# Patient Record
Sex: Male | Born: 1935 | Race: White | Hispanic: No | Marital: Married | Smoking: Former smoker
Health system: Southern US, Community
[De-identification: ages and names within clinical notes are randomized; demographics above are authoritative.]

## PROBLEM LIST (undated history)

## (undated) DIAGNOSIS — Z79899 Other long term (current) drug therapy: Secondary | ICD-10-CM

## (undated) DIAGNOSIS — I495 Sick sinus syndrome: Secondary | ICD-10-CM

## (undated) DIAGNOSIS — E039 Hypothyroidism, unspecified: Secondary | ICD-10-CM

## (undated) DIAGNOSIS — R0789 Other chest pain: Secondary | ICD-10-CM

## (undated) DIAGNOSIS — K219 Gastro-esophageal reflux disease without esophagitis: Secondary | ICD-10-CM

## (undated) DIAGNOSIS — F32A Depression, unspecified: Secondary | ICD-10-CM

## (undated) DIAGNOSIS — I480 Paroxysmal atrial fibrillation: Secondary | ICD-10-CM

## (undated) DIAGNOSIS — G459 Transient cerebral ischemic attack, unspecified: Secondary | ICD-10-CM

## (undated) DIAGNOSIS — F329 Major depressive disorder, single episode, unspecified: Secondary | ICD-10-CM

## (undated) HISTORY — DX: Sick sinus syndrome: I49.5

## (undated) HISTORY — DX: Other long term (current) drug therapy: Z79.899

## (undated) HISTORY — DX: Other chest pain: R07.89

## (undated) HISTORY — DX: Transient cerebral ischemic attack, unspecified: G45.9

## (undated) HISTORY — DX: Paroxysmal atrial fibrillation: I48.0

## (undated) HISTORY — DX: Depression, unspecified: F32.A

## (undated) HISTORY — DX: Gastro-esophageal reflux disease without esophagitis: K21.9

## (undated) HISTORY — DX: Major depressive disorder, single episode, unspecified: F32.9

## (undated) HISTORY — DX: Hypothyroidism, unspecified: E03.9

## (undated) HISTORY — PX: ABDOMINAL SURGERY: SHX537

---

## 1997-10-08 ENCOUNTER — Ambulatory Visit (HOSPITAL_COMMUNITY): Admission: RE | Admit: 1997-10-08 | Discharge: 1997-10-08 | Payer: Self-pay | Admitting: Gastroenterology

## 1998-12-01 ENCOUNTER — Ambulatory Visit (HOSPITAL_COMMUNITY): Admission: RE | Admit: 1998-12-01 | Discharge: 1998-12-01 | Payer: Self-pay | Admitting: Gastroenterology

## 1999-08-08 ENCOUNTER — Encounter: Payer: Self-pay | Admitting: General Surgery

## 1999-08-08 ENCOUNTER — Inpatient Hospital Stay (HOSPITAL_COMMUNITY): Admission: EM | Admit: 1999-08-08 | Discharge: 1999-08-13 | Payer: Self-pay | Admitting: *Deleted

## 1999-08-11 ENCOUNTER — Encounter: Payer: Self-pay | Admitting: General Surgery

## 1999-09-17 ENCOUNTER — Encounter: Admission: RE | Admit: 1999-09-17 | Discharge: 1999-09-17 | Payer: Self-pay | Admitting: General Surgery

## 1999-09-17 ENCOUNTER — Encounter: Payer: Self-pay | Admitting: General Surgery

## 2000-01-04 ENCOUNTER — Encounter: Payer: Self-pay | Admitting: Gastroenterology

## 2000-01-04 ENCOUNTER — Ambulatory Visit (HOSPITAL_COMMUNITY): Admission: RE | Admit: 2000-01-04 | Discharge: 2000-01-04 | Payer: Self-pay | Admitting: Gastroenterology

## 2000-02-10 ENCOUNTER — Encounter: Payer: Self-pay | Admitting: General Surgery

## 2000-02-16 ENCOUNTER — Encounter (INDEPENDENT_AMBULATORY_CARE_PROVIDER_SITE_OTHER): Payer: Self-pay | Admitting: Specialist

## 2000-02-16 ENCOUNTER — Inpatient Hospital Stay (HOSPITAL_COMMUNITY): Admission: RE | Admit: 2000-02-16 | Discharge: 2000-02-23 | Payer: Self-pay | Admitting: General Surgery

## 2004-01-14 ENCOUNTER — Ambulatory Visit: Payer: Self-pay | Admitting: Gastroenterology

## 2004-01-16 ENCOUNTER — Ambulatory Visit: Payer: Self-pay | Admitting: Cardiology

## 2004-01-17 ENCOUNTER — Ambulatory Visit: Payer: Self-pay | Admitting: Cardiology

## 2004-01-20 ENCOUNTER — Ambulatory Visit (HOSPITAL_COMMUNITY): Admission: RE | Admit: 2004-01-20 | Discharge: 2004-01-20 | Payer: Self-pay | Admitting: Gastroenterology

## 2004-01-20 ENCOUNTER — Ambulatory Visit: Payer: Self-pay | Admitting: Gastroenterology

## 2004-01-21 ENCOUNTER — Ambulatory Visit: Payer: Self-pay | Admitting: Cardiology

## 2004-02-05 ENCOUNTER — Ambulatory Visit: Payer: Self-pay | Admitting: Gastroenterology

## 2004-12-03 ENCOUNTER — Ambulatory Visit: Payer: Self-pay | Admitting: Gastroenterology

## 2006-04-05 ENCOUNTER — Ambulatory Visit: Payer: Self-pay | Admitting: Gastroenterology

## 2007-05-30 ENCOUNTER — Encounter: Payer: Self-pay | Admitting: Cardiology

## 2007-06-03 ENCOUNTER — Ambulatory Visit: Payer: Self-pay | Admitting: Cardiology

## 2007-06-05 ENCOUNTER — Encounter: Payer: Self-pay | Admitting: Cardiology

## 2007-06-20 ENCOUNTER — Encounter: Payer: Self-pay | Admitting: Cardiology

## 2007-06-28 ENCOUNTER — Ambulatory Visit: Payer: Self-pay | Admitting: Gastroenterology

## 2007-07-04 ENCOUNTER — Encounter: Payer: Self-pay | Admitting: Cardiology

## 2007-07-05 ENCOUNTER — Ambulatory Visit (HOSPITAL_COMMUNITY): Admission: RE | Admit: 2007-07-05 | Discharge: 2007-07-05 | Payer: Self-pay | Admitting: Gastroenterology

## 2007-07-10 ENCOUNTER — Ambulatory Visit: Payer: Self-pay | Admitting: Cardiology

## 2007-07-12 ENCOUNTER — Ambulatory Visit: Payer: Self-pay | Admitting: Gastroenterology

## 2007-07-20 ENCOUNTER — Ambulatory Visit: Payer: Self-pay | Admitting: Cardiology

## 2007-08-04 ENCOUNTER — Ambulatory Visit: Payer: Self-pay | Admitting: Cardiology

## 2007-08-24 ENCOUNTER — Ambulatory Visit: Payer: Self-pay | Admitting: Cardiology

## 2007-09-07 ENCOUNTER — Ambulatory Visit: Payer: Self-pay | Admitting: Cardiology

## 2007-09-20 ENCOUNTER — Encounter: Payer: Self-pay | Admitting: Gastroenterology

## 2007-09-28 ENCOUNTER — Ambulatory Visit: Payer: Self-pay | Admitting: Cardiology

## 2007-10-27 ENCOUNTER — Ambulatory Visit: Payer: Self-pay | Admitting: Cardiology

## 2007-11-24 ENCOUNTER — Ambulatory Visit: Payer: Self-pay | Admitting: Cardiology

## 2007-12-22 ENCOUNTER — Ambulatory Visit: Payer: Self-pay | Admitting: Cardiology

## 2008-01-05 ENCOUNTER — Ambulatory Visit: Payer: Self-pay | Admitting: Cardiology

## 2008-01-26 ENCOUNTER — Ambulatory Visit: Payer: Self-pay | Admitting: Cardiology

## 2008-02-23 ENCOUNTER — Ambulatory Visit: Payer: Self-pay | Admitting: Cardiology

## 2008-02-28 ENCOUNTER — Encounter: Payer: Self-pay | Admitting: Cardiology

## 2008-03-12 ENCOUNTER — Ambulatory Visit: Payer: Self-pay | Admitting: Cardiology

## 2008-03-12 ENCOUNTER — Encounter: Payer: Self-pay | Admitting: Cardiology

## 2008-03-18 ENCOUNTER — Ambulatory Visit: Payer: Self-pay | Admitting: Cardiology

## 2008-04-12 ENCOUNTER — Ambulatory Visit: Payer: Self-pay | Admitting: Cardiology

## 2008-05-10 ENCOUNTER — Ambulatory Visit: Payer: Self-pay | Admitting: Cardiology

## 2008-05-21 ENCOUNTER — Ambulatory Visit: Payer: Self-pay | Admitting: Cardiology

## 2008-05-23 ENCOUNTER — Ambulatory Visit: Payer: Self-pay | Admitting: Cardiology

## 2008-06-18 ENCOUNTER — Ambulatory Visit: Payer: Self-pay | Admitting: Cardiology

## 2008-07-02 ENCOUNTER — Ambulatory Visit: Payer: Self-pay | Admitting: Cardiology

## 2008-07-30 ENCOUNTER — Ambulatory Visit: Payer: Self-pay | Admitting: Cardiology

## 2008-08-27 ENCOUNTER — Ambulatory Visit: Payer: Self-pay | Admitting: Cardiology

## 2008-09-10 ENCOUNTER — Ambulatory Visit: Payer: Self-pay | Admitting: Cardiology

## 2008-10-04 ENCOUNTER — Ambulatory Visit: Payer: Self-pay

## 2008-10-28 ENCOUNTER — Encounter: Payer: Self-pay | Admitting: *Deleted

## 2008-11-01 ENCOUNTER — Ambulatory Visit: Payer: Self-pay | Admitting: Cardiology

## 2008-11-01 LAB — CONVERTED CEMR LAB
POC INR: 2.8
Prothrombin Time: 20.2 s

## 2008-11-11 DIAGNOSIS — R079 Chest pain, unspecified: Secondary | ICD-10-CM | POA: Insufficient documentation

## 2008-11-11 DIAGNOSIS — I4891 Unspecified atrial fibrillation: Secondary | ICD-10-CM

## 2008-11-15 ENCOUNTER — Ambulatory Visit: Payer: Self-pay | Admitting: Cardiology

## 2008-11-15 ENCOUNTER — Encounter (INDEPENDENT_AMBULATORY_CARE_PROVIDER_SITE_OTHER): Payer: Self-pay | Admitting: *Deleted

## 2008-11-15 DIAGNOSIS — E041 Nontoxic single thyroid nodule: Secondary | ICD-10-CM

## 2008-11-15 DIAGNOSIS — I498 Other specified cardiac arrhythmias: Secondary | ICD-10-CM

## 2008-11-15 DIAGNOSIS — R5383 Other fatigue: Secondary | ICD-10-CM

## 2008-11-15 DIAGNOSIS — R5381 Other malaise: Secondary | ICD-10-CM | POA: Insufficient documentation

## 2008-11-19 ENCOUNTER — Encounter: Payer: Self-pay | Admitting: Cardiology

## 2008-11-22 ENCOUNTER — Ambulatory Visit: Payer: Self-pay | Admitting: Cardiology

## 2008-11-25 ENCOUNTER — Encounter (INDEPENDENT_AMBULATORY_CARE_PROVIDER_SITE_OTHER): Payer: Self-pay | Admitting: *Deleted

## 2008-11-27 ENCOUNTER — Encounter (INDEPENDENT_AMBULATORY_CARE_PROVIDER_SITE_OTHER): Payer: Self-pay | Admitting: *Deleted

## 2008-11-29 ENCOUNTER — Ambulatory Visit: Payer: Self-pay | Admitting: Cardiology

## 2008-11-29 LAB — CONVERTED CEMR LAB: POC INR: 2.5

## 2008-12-18 ENCOUNTER — Telehealth (INDEPENDENT_AMBULATORY_CARE_PROVIDER_SITE_OTHER): Payer: Self-pay | Admitting: *Deleted

## 2008-12-27 ENCOUNTER — Ambulatory Visit: Payer: Self-pay | Admitting: Cardiology

## 2008-12-27 LAB — CONVERTED CEMR LAB: POC INR: 3

## 2009-01-10 ENCOUNTER — Ambulatory Visit: Payer: Self-pay | Admitting: Cardiology

## 2009-01-10 LAB — CONVERTED CEMR LAB: POC INR: 1.8

## 2009-01-24 ENCOUNTER — Encounter: Payer: Self-pay | Admitting: Cardiology

## 2009-01-28 ENCOUNTER — Ambulatory Visit: Payer: Self-pay | Admitting: Cardiology

## 2009-02-18 ENCOUNTER — Ambulatory Visit: Payer: Self-pay | Admitting: Cardiology

## 2009-02-18 LAB — CONVERTED CEMR LAB: POC INR: 3.3

## 2009-03-18 ENCOUNTER — Ambulatory Visit: Payer: Self-pay | Admitting: Cardiology

## 2009-03-18 LAB — CONVERTED CEMR LAB: POC INR: 2.9

## 2009-03-24 ENCOUNTER — Encounter: Payer: Self-pay | Admitting: Cardiology

## 2009-04-08 ENCOUNTER — Ambulatory Visit: Payer: Self-pay | Admitting: Cardiology

## 2009-04-10 ENCOUNTER — Ambulatory Visit: Payer: Self-pay | Admitting: Cardiology

## 2009-04-22 ENCOUNTER — Ambulatory Visit: Payer: Self-pay | Admitting: Cardiology

## 2009-05-06 ENCOUNTER — Encounter: Payer: Self-pay | Admitting: Cardiology

## 2009-05-07 ENCOUNTER — Encounter: Payer: Self-pay | Admitting: Cardiology

## 2009-05-20 ENCOUNTER — Encounter: Payer: Self-pay | Admitting: Cardiology

## 2009-07-13 DIAGNOSIS — I480 Paroxysmal atrial fibrillation: Secondary | ICD-10-CM

## 2009-07-13 HISTORY — DX: Paroxysmal atrial fibrillation: I48.0

## 2009-08-05 ENCOUNTER — Ambulatory Visit: Payer: Self-pay | Admitting: Cardiology

## 2009-08-08 ENCOUNTER — Encounter: Payer: Self-pay | Admitting: Cardiology

## 2009-08-14 ENCOUNTER — Encounter (INDEPENDENT_AMBULATORY_CARE_PROVIDER_SITE_OTHER): Payer: Self-pay | Admitting: *Deleted

## 2010-01-30 ENCOUNTER — Ambulatory Visit: Payer: Self-pay | Admitting: Cardiology

## 2010-04-12 LAB — CONVERTED CEMR LAB: POC INR: 1.7

## 2010-04-14 NOTE — Letter (Signed)
Summary: [facility] D/C DR. Leandrew Koyanagi  [facility] D[facility]C DR. Leandrew Koyanagi   Imported By: Zachary Nafis 08/04/2009 16:50:22  _____________________________________________________________________  External Attachment:    Type:   Image     Comment:   External Document

## 2010-04-14 NOTE — Assessment & Plan Note (Signed)
Summary: 3 MO FU REMINDER-SRS   Visit Type:  Follow-up Primary Provider:  Margot Ables  CC:  Atrial Fibrillation.  History of Present Illness: The patient has a history of atrial fibrillation. He has had some issues of bradycardia in the past and recently had his beta blocker discontinued. He had a stress perfusion study in September demonstrating that he did have a reasonable heart rate response with exercise. There was no evidence of ischemia or infarct. He returns for routine followup. He has had no new symptoms. He goes to [facility] rehabilitation and brings wood in for his house. With this level of activity he is not having any new shortness of breath and denies any chest pressure, neck or arm discomfort. He doesn't notice palpitations. He's had no syncope though occasionally he will get some lightheadedness but this has been a chronic pattern for years.  Preventive Screening-Counseling & Management  Comments: Chewed tobacco for about 20 yrs and quit about 2 yrs ago. Pt smoked 3 yrs and quit about 50 yrs ago.  Current Medications (verified): 1)  Ocuvite Preservision  Tabs (Multiple Vitamins-Minerals) .... Take 1 Tablet By Mouth Two Times A Day 2)  Vitamin B-12 1000 Mcg Tabs (Cyanocobalamin) .... Take 1 Tablet By Mouth Once A Day 3)  Simvastatin 20 Mg Tabs (Simvastatin) .... Take One Tablet By Mouth Daily At Bedtime 4)  Warfarin Sodium 5 Mg Tabs (Warfarin Sodium) .... Use As Directed By Anticoagulation Clinic 5)  Omeprazole 20 Mg Cpdr (Omeprazole) .... Take 1 Tablet By Mouth Once A Day 6)  Lexapro 10 Mg Tabs (Escitalopram Oxalate) .... Take 1 Tablet By Mouth Once A Day 7)  Levothyroxine Sodium 25 Mcg Tabs (Levothyroxine Sodium) .... Take 1 Tablet By Mouth Once A Day 8)  Aciphex 20 Mg Tbec (Rabeprazole Sodium) .... Take 1 Tablet By Mouth Once A Day 9)  Lorazepam 0.5 Mg Tabs (Lorazepam) .... As Needed  Allergies (verified): No Known Drug Allergies  Comments:  Nurse/Medical  Assistant: The patient's medications were reviewed with the patient and were updated in the Medication List. Pt brought a list of medications to office visit.  Cyril Loosen, RN, BSN (April 10, 2009 9:13 AM)  Past History:  Past Medical History: Reviewed history from 11/15/2008 and no changes required. ATRIAL FIBRILLATION (ICD-427.31) CHEST PAIN-UNSPECIFIED (ICD-786.50) Gastroesophageal reflux disease.  Hypothyroidism.  Depression 1. Paroxysmal atrial fibrillation in normal sinus rhythm.     a.     Rule out symptomatic bradycardia.     b.     Rule out post conversion pauses. 2. History of transient ischemic attack, high risk for thromboembolic     disease, secondary to atrial fibrillation. 3. Coumadin therapy. 4. Atypical chest pain. 5. Gastroesophageal reflux disease. 6. Hypothyroidism. 7. Depression.   Past Surgical History: Abdomonal surgery for diverticulitis  Review of Systems       As stated in the HPI and negative for all other systems.   Vital Signs:  Patient profile:   77 year old male Height:      68 inches Weight:      183 pounds Pulse rate:   62 / minute BP sitting:   121 / 69  (left arm) Cuff size:   regular  Vitals Entered By: Cyril Loosen, RN, BSN (April 10, 2009 9:06 AM) CC: Atrial Fibrillation Comments follow up visit. Pt states he's doing well.   Physical Exam  General:  Well developed, well nourished, in no acute distress. Head:  normocephalic and atraumatic Eyes:  PERRLA/EOM  intact; conjunctiva and lids normal. Mouth:  Teeth, gums and palate normal. Oral mucosa normal. Neck:  Neck supple, no JVD. No masses, thyromegaly or abnormal cervical nodes. Chest Wall:  no deformities or breast masses noted Lungs:  Clear bilaterally to auscultation and percussion. Heart:  Non-displaced PMI, chest non-tender; regular rate and rhythm, S1, S2 without murmurs, rubs or gallops. Carotid upstroke normal, no bruit. Normal abdominal aortic size, no  bruits. Femorals normal pulses, no bruits. Pedals normal pulses. No edema, no varicosities. Abdomen:  Bowel sounds positive; abdomen soft and non-tender without masses, organomegaly, or hernias noted. No hepatosplenomegaly. Msk:  Back normal, normal gait. Muscle strength and tone normal for age Extremities:  No clubbing or cyanosis. Neurologic:  Alert and oriented x 3. Skin:  Intact without lesions or rashes. Psych:  Normal affect.   EKG  Procedure date:  04/10/2009  Findings:      sinus bradycardia, rate 57, axis within normal limits, intervals within normal limits, no acute ST-T wave changes.  Impression & Recommendations:  Problem # 1:  ATRIAL FIBRILLATION (ICD-427.31) He has had paroxysmal atrial fibrillation. He doesn't recall any symptomatic recurrences of this since his last visit. He tolerates Coumadin. He is off of all AV nodal blocking agents. We discussed the importance of notifying Dr. began should he ever had presyncope or syncope or other symptoms that could be related to bradycardia. For now he needs no change in therapy.  Problem # 2:  CHEST PAIN-UNSPECIFIED (ICD-786.50) He had a negative stress perfusion study in the fall. No further testing is suggested.  Other Orders: EKG w/ Interpretation (93000)  Patient Instructions: 1)  Your physician recommends that you continue on your current medications as directed. Please refer to the Current Medication list given to you today. 2)  Your physician wants you to follow-up in:30months.  You will receive a reminder letter in the mail about two months in advance. If you don't receive a letter, please call our office to schedule the follow-up appointment.

## 2010-04-14 NOTE — Procedures (Signed)
Summary: Holter and Event/ [facility]  Holter and Event/ [facility]   Imported By: Dorise Hiss 08/13/2009 09:59:38  _____________________________________________________________________  External Attachment:    Type:   Image     Comment:   External Document  Appended Document: Holter and Event/ [facility] Monitor essentially unremarkable.   Appended Document: Holter and Event/ [facility] Patient notified by letter.

## 2010-04-14 NOTE — Medication Information (Signed)
Summary: ccr-lr  Anticoagulant Therapy  Managed by: Vashti Hey, RN PCP: Margot Ables Supervising MD: Andee Lineman MD, Michelle Piper Indication 1: Atrial Fibrillation (ICD-427.31) [facility] [place] Site: [facility] INR POC 2.9  Dietary changes: no    Health status changes: no    Bleeding/hemorrhagic complications: no    Recent/future hospitalizations: no    Any changes in medication regimen? no    Recent/future dental: yes     Details: had tooth extraction last Monday    Any missed doses?: yes     Details: off coumadin 5 days for extraction    Allergies: No Known Drug Allergies  Anticoagulation Management History:      Positive risk factors for bleeding include an age of 75 years or older.  The bleeding index is 'intermediate risk'.  Positive CHADS2 values include Age > 48 years old.  The start date was 07/10/2007.  Anticoagulation responsible provider: Andee Lineman MD, Michelle Piper.  INR POC: 2.9.  Exp: 10/11.    Anticoagulation Management Assessment/Plan:      The patient's current anticoagulation dose is Warfarin sodium 5 mg tabs: Use as directed by Anticoagulation Clinic.  The target INR is 2 - 3.  The next INR is due 04/08/2009.  Anticoagulation instructions were given to patient.  Results were reviewed/authorized by Vashti Hey, RN.  He was notified by Vashti Hey RN.         Prior Anticoagulation Instructions: INR 3.3 Hold coumadin tonight then resume 5mg  once daily   Current Anticoagulation Instructions: INR 2.9 Continue coumadin 5mg  once daily

## 2010-04-14 NOTE — Letter (Signed)
Summary: Appointment -missed  [facility] [address]   [place], [place] [address]   Phone: [phone]  Fax: [phone]     May 20, 2009 MRN: [id number]      Wesley Dyer [address] [place], [place]  [address]     Dear Mr. TOOKER,  Our records indicate you missed your appointment on May 20, 2009                        with Coumadin.   It is very important that we reach you to reschedule this appointment. We look forward to participating in your health care needs.   Please contact us at the number listed above at your earliest convenience to reschedule this appointment.   Sincerely,    Glass blower/designer

## 2010-04-14 NOTE — Medication Information (Signed)
Summary: ccr-lr  Anticoagulant Therapy  Managed by: Vashti Hey, RN PCP: Margot Ables Supervising MD: Andee Lineman MD, Michelle Piper Indication 1: Atrial Fibrillation (ICD-427.31) [facility]  Site: [facility] INR POC 3.7  Dietary changes: no    Health status changes: no    Bleeding/hemorrhagic complications: no    Recent/future hospitalizations: no    Any changes in medication regimen? no    Recent/future dental: yes     Details: another tooth pulled  Any missed doses?: yes     Details: was off coumadin 5 days for extraction and Abx for congestion  Is patient compliant with meds? yes       Allergies: No Known Drug Allergies  Anticoagulation Management History:      The patient is taking warfarin and comes in today for a routine follow up visit.  Positive risk factors for bleeding include an age of 75 years or older.  The bleeding index is 'intermediate risk'.  Positive CHADS2 values include Age > 75 years old.  The start date was 07/10/2007.  Anticoagulation responsible provider: Andee Lineman MD, Michelle Piper.  INR POC: 3.7.  Cuvette Lot#: [id number].  Exp: 10/11.    Anticoagulation Management Assessment/Plan:      The patient's current anticoagulation dose is Warfarin sodium 5 mg tabs: Use as directed by Anticoagulation Clinic.  The target INR is 2 - 3.  The next INR is due 04/22/2009.  Anticoagulation instructions were given to patient.  Results were reviewed/authorized by Vashti Hey, RN.  He was notified by Vashti Hey RN.         Prior Anticoagulation Instructions: INR 2.9 Continue coumadin 5mg  once daily   Current Anticoagulation Instructions: INR 3.7 Hold coumadin tonight then decrease dose to 5mg  once daily except 2.5mg  on Mondays and Fridays

## 2010-04-14 NOTE — Assessment & Plan Note (Signed)
Summary: 6 MO FU PER NOV REMINDER   Visit Type:  Follow-up Primary Neshia Mckenzie:  Margot Ables   History of Present Illness: the patient is an 77 year old male with a history of paroxysmal atrial fibrillation more recently in sinus bradycardia. He has previously been ruled out for post conversion pauses. Has a prior history of TIA and remains at high risk for thromboembolic disease secondary to atrial fibrillation. Coumadin was previously discontinued secondary to severe epistaxis during the last visit the patient was not willing to restart Coumadin.the patient have a Holter monitor done in May of 2011 this showed essentially normal sinus rhythm with a minimum heart rate of 40 beats per minute no significant pauses greater than 2.5 seconds and brief runs of atrial arrhythmia No palpitions. No dizziness. Feels tired. Some depression. No syncope. Declines coumadin. No TIAs  Preventive Screening-Counseling & Management  Alcohol-Tobacco     Smoking Status: quit     Year Quit: 1985  Current Medications (verified): 1)  Ocuvite Preservision  Tabs (Multiple Vitamins-Minerals) .... Take 1 Tablet By Mouth Two Times A Day 2)  Vitamin B-12 1000 Mcg Tabs (Cyanocobalamin) .... Take 1 Tablet By Mouth Once A Day 3)  Simvastatin 20 Mg Tabs (Simvastatin) .... Take One Tablet By Mouth Daily At Bedtime 4)  Omeprazole 20 Mg Cpdr (Omeprazole) .... Take 1 Tablet By Mouth Once A Day 5)  Lexapro 20 Mg Tabs (Escitalopram Oxalate) .... Take 1 Tablet By Mouth Once A Day 6)  Levothyroxine Sodium 25 Mcg Tabs (Levothyroxine Sodium) .... Take 1 Tablet By Mouth Once A Day 7)  Caltrate 600+d 600-400 Mg-Unit Tabs (Calcium Carbonate-Vitamin D) .... Take 1 Tablet By Mouth Two Times A Day  Allergies (verified): 1)  ! Coumadin  Comments:  Nurse/Medical Assistant: The patient is currently on medications but does not know the name or dosage at this time. Instructed to contact our office with details. Will update medication list  at that time.  Past History:  Past Medical History: Last updated: 11/15/2008 ATRIAL FIBRILLATION (ICD-427.31) CHEST PAIN-UNSPECIFIED (ICD-786.50) Gastroesophageal reflux disease.  Hypothyroidism.  Depression 1. Paroxysmal atrial fibrillation in normal sinus rhythm.     a.     Rule out symptomatic bradycardia.     b.     Rule out post conversion pauses. 2. History of transient ischemic attack, high risk for thromboembolic     disease, secondary to atrial fibrillation. 3. Coumadin therapy. 4. Atypical chest pain. 5. Gastroesophageal reflux disease. 6. Hypothyroidism. 7. Depression.   Past Surgical History: Last updated: 04/10/2009 Abdomonal surgery for diverticulitis  Family History: Last updated: 11/11/2008 Family History of Coronary Artery Disease:  Family History of Hyperlipidemia:   Social History: Last updated: 11/11/2008 Retired  Married  Tobacco Use - No.  Alcohol Use - no  Risk Factors: Smoking Status: quit (01/30/2010)  Review of Systems       The patient complains of fatigue.  The patient denies malaise, fever, weight gain/loss, vision loss, decreased hearing, hoarseness, chest pain, palpitations, shortness of breath, prolonged cough, wheezing, sleep apnea, coughing up blood, abdominal pain, blood in stool, nausea, vomiting, diarrhea, heartburn, incontinence, blood in urine, muscle weakness, joint pain, leg swelling, rash, skin lesions, headache, fainting, dizziness, depression, anxiety, enlarged lymph nodes, easy bruising or bleeding, and environmental allergies.         history of epistaxis  Vital Signs:  Patient profile:   77 year old male Height:      68 inches Weight:      176 pounds  BMI:     26.86 Pulse rate:   46 / minute BP sitting:   147 / 74  (left arm) Cuff size:   regular  Vitals Entered By: Carlye Grippe (January 30, 2010 1:43 PM)  Physical Exam  Additional Exam:  General: Well-developed, well-nourished in no distress head:  Normocephalic and atraumatic eyes PERRLA/EOMI intact, conjunctiva and lids normal nose: No deformity or lesions mouth normal dentition, normal posterior pharynx neck: Supple, no JVD.  No masses, thyromegaly or abnormal cervical nodes lungs: Normal breath sounds bilaterally without wheezing.  Normal percussion heart: regular rate and rhythm with normal S1 and S2, no S3 or S4.  PMI is normal.  No pathological murmurs abdomen: Normal bowel sounds, abdomen is soft and nontender without masses, organomegaly or hernias noted.  No hepatosplenomegaly musculoskeletal: Back normal, normal gait muscle strength and tone normal pulsus: Pulse is normal in all 4 extremities Extremities: No peripheral pitting edema neurologic: Alert and oriented x 3 skin: Intact without lesions or rashes cervical nodes: No significant adenopathy psychologic: Normal affect    Impression & Recommendations:  Problem # 1:  BRADYCARDIA, CHRONIC (ICD-427.89) patient has history of sinus bradycardia.  He also has a history of paroxysmal atrial fibrillation.  He has been present ruled out for postconversion closets.  Has prior history of TIA and remains at high risk for thromboembolic disease however the patient declines to restart Coumadin due to a prior history of epistaxis.  Problem # 2:  ATRIAL FIBRILLATION (ICD-427.31) no recurrent palpitations.  The patient appears to maintaining normal sinus rhythm  Problem # 3:  WEAKNESS (ICD-780.79) generalized fatigue.  No definite cardiac cause.  Patient Instructions: 1)  Your physician recommends that you continue on your current medications as directed. Please refer to the Current Medication list given to you today. 2)  Follow up in  6 months

## 2010-04-14 NOTE — Letter (Signed)
Summary: Appointment- Rescheduled  [facility] [address]   [place], [place] [address]   Phone: [phone]  Fax: [phone]     March 24, 2009 MRN: [id number]      Wesley Dyer [address] [place], [place]  [address]      Dear Wesley Dyer,   Due to a change in our office schedule, your appointment on JAN 27th with  Dr. Andee Lineman has been changed to Jan 28th with Dr. Antoine Poche @ 9:00.   Please call the office only if you wish not to keep appointment.   We look forward to participating in your health care needs.       Sincerely,  Glass blower/designer

## 2010-04-14 NOTE — Medication Information (Signed)
Summary: ccr-lr  Anticoagulant Therapy  Managed by: Vashti Hey, RN PCP: Margot Ables Supervising MD: Andee Lineman MD, Michelle Piper Indication 1: Atrial Fibrillation (ICD-427.31) Lab [facility]: [facility] [place] Site: [facility] INR POC 2.7  Dietary changes: no    Health status changes: no    Bleeding/hemorrhagic complications: no    Recent/future hospitalizations: no    Any changes in medication regimen? no    Recent/future dental: no  Any missed doses?: no       Is patient compliant with meds? yes       Allergies: No Known Drug Allergies  Anticoagulation Management History:      The patient is taking warfarin and comes in today for a routine follow up visit.  Positive risk factors for bleeding include an age of 75 years or older.  The bleeding index is 'intermediate risk'.  Positive CHADS2 values include Age > 26 years old.  The start date was 07/10/2007.  Anticoagulation responsible provider: Andee Lineman MD, Michelle Piper.  INR POC: 2.7.  Cuvette Lot#: [id number].  Exp: 10/11.    Anticoagulation Management Assessment/Plan:      The patient's current anticoagulation dose is Warfarin sodium 5 mg tabs: Use as directed by Anticoagulation Clinic.  The target INR is 2 - 3.  The next INR is due 05/20/2009.  Anticoagulation instructions were given to patient.  Results were reviewed/authorized by Vashti Hey, RN.  He was notified by Vashti Hey RN.         Prior Anticoagulation Instructions: INR 3.7 Hold coumadin tonight then decrease dose to 5mg  once daily except 2.5mg  on Mondays and Fridays  Current Anticoagulation Instructions: INR 2.7 Continue coumadin 5mg  once daily except 2.5mg  on Mondays and Fridays

## 2010-04-14 NOTE — Assessment & Plan Note (Signed)
Summary: 4 MO FU REMINDER-SRS   Visit Type:  Follow-up Primary Provider:  Margot Ables   History of Present Illness: the patient is a77 year old male with a history of proximal atrial fibrillation currently in sinus bradycardia. He has been ruled out for postconversion pauses. He also has a history of TIA and is at high risk for thromboembolic disease is secondary to a perforation. However Coumadin was recently discontinued due to severe epistaxis. At this point the patient is not willing to restart Coumadin yet. Is also not willing to start aspirin. He does report dizziness and fatigue and generalized weakness. His EKG today shows marked sinus bradycardia with a heart rate of 44 beats per minutes.  Preventive Screening-Counseling & Management  Alcohol-Tobacco     Smoking Status: quit     Year Quit: 1985  Current Medications (verified): 1)  Ocuvite Preservision  Tabs (Multiple Vitamins-Minerals) .... Take 1 Tablet By Mouth Two Times A Day 2)  Vitamin B-12 1000 Mcg Tabs (Cyanocobalamin) .... Take 1 Tablet By Mouth Once A Day 3)  Simvastatin 20 Mg Tabs (Simvastatin) .... Take One Tablet By Mouth Daily At Bedtime 4)  Omeprazole 20 Mg Cpdr (Omeprazole) .... Take 1 Tablet By Mouth Once A Day 5)  Lexapro 20 Mg Tabs (Escitalopram Oxalate) .... Take 1 Tablet By Mouth Once A Day 6)  Levothyroxine Sodium 25 Mcg Tabs (Levothyroxine Sodium) .... Take 1 Tablet By Mouth Once A Day 7)  Caltrate 600+d 600-400 Mg-Unit Tabs (Calcium Carbonate-Vitamin D) .... Take 1 Tablet By Mouth Two Times A Day  Allergies (verified): 1)  ! Coumadin  Comments:  Nurse/Medical Assistant: The patient's medications and allergies were reviewed with the patient and were updated in the Medication and Allergy Lists. List reviewed.  Past History:  Past Medical History: Last updated: 11/15/2008 ATRIAL FIBRILLATION (ICD-427.31) CHEST PAIN-UNSPECIFIED (ICD-786.50) Gastroesophageal reflux disease.  Hypothyroidism.    Depression 1. Paroxysmal atrial fibrillation in normal sinus rhythm.     a.     Rule out symptomatic bradycardia.     b.     Rule out post conversion pauses. 2. History of transient ischemic attack, high risk for thromboembolic     disease, secondary to atrial fibrillation. 3. Coumadin therapy. 4. Atypical chest pain. 5. Gastroesophageal reflux disease. 6. Hypothyroidism. 7. Depression.   Past Surgical History: Last updated: 04/10/2009 Abdomonal surgery for diverticulitis  Family History: Last updated: 11/11/2008 Family History of Coronary Artery Disease:  Family History of Hyperlipidemia:   Social History: Last updated: 11/11/2008 Retired  Married  Tobacco Use - No.  Alcohol Use - no  Risk Factors: Smoking Status: quit (08/05/2009)  Social History: Smoking Status:  quit  Review of Systems       The patient complains of fatigue.  The patient denies malaise, fever, weight gain/loss, vision loss, decreased hearing, hoarseness, chest pain, palpitations, shortness of breath, prolonged cough, wheezing, sleep apnea, coughing up blood, abdominal pain, blood in stool, nausea, vomiting, diarrhea, heartburn, incontinence, blood in urine, muscle weakness, joint pain, leg swelling, rash, skin lesions, headache, fainting, dizziness, depression, anxiety, enlarged lymph nodes, easy bruising or bleeding, and environmental allergies.    Vital Signs:  Patient profile:   77 year old male Height:      68 inches Weight:      180 pounds Pulse rate:   44 / minute BP sitting:   133 / 55  (left arm) Cuff size:   large  Vitals Entered By: Carlye Grippe (Aug 05, 2009 8:43 AM)  Physical  Exam  Additional Exam:  General: Well-developed, well-nourished in no distress head: Normocephalic and atraumatic eyes PERRLA/EOMI intact, conjunctiva and lids normal nose: No deformity or lesions mouth normal dentition, normal posterior pharynx neck: Supple, no JVD.  No masses, thyromegaly or abnormal  cervical nodes lungs: Normal breath sounds bilaterally without wheezing.  Normal percussion heart: regular rate and rhythm with normal S1 and S2, no S3 or S4.  PMI is normal.  No pathological murmurs abdomen: Normal bowel sounds, abdomen is soft and nontender without masses, organomegaly or hernias noted.  No hepatosplenomegaly musculoskeletal: Back normal, normal gait muscle strength and tone normal pulsus: Pulse is normal in all 4 extremities Extremities: No peripheral pitting edema neurologic: Alert and oriented x 3 skin: Intact without lesions or rashes cervical nodes: No significant adenopathy psychologic: Normal affect    EKG  Procedure date:  08/05/2009  Findings:      sinus bradycardia heart rate 44 beats per minute nonspecific T-wave changes.  Impression & Recommendations:  Problem # 1:  BRADYCARDIA, CHRONIC (ICD-427.89) we'll apply a Holter monitor to make sure the patient's symptoms of dizziness and weakness are not related to significant bradycardia The following medications were removed from the medication list:    Warfarin Sodium 5 Mg Tabs (Warfarin sodium) ..... Use as directed by anticoagulation clinic  Orders: Holter Monitor (Holter Monitor)  Problem # 2:  ATRIAL FIBRILLATION (ICD-427.31) no recurrence of a defibrillation the patient is in normal sinus rhythm. The following medications were removed from the medication list:    Warfarin Sodium 5 Mg Tabs (Warfarin sodium) ..... Use as directed by anticoagulation clinic  Orders: EKG w/ Interpretation (93000) Holter Monitor (Holter Monitor)  Problem # 3:  CHEST PAIN-UNSPECIFIED (ICD-786.50) no recurrent chest pain. No prior history of significant coronary artery disease. The following medications were removed from the medication list:    Warfarin Sodium 5 Mg Tabs (Warfarin sodium) ..... Use as directed by anticoagulation clinic  Patient Instructions: 1)  Your physician has recommended that you wear a holter  monitor.  Holter monitors are medical devices that record the heart's electrical activity. Doctors most often use these monitors to diagnose arrhythmias. Arrhythmias are problems with the speed or rhythm of the heartbeat. The monitor is a small, portable device. You can wear one while you do your normal daily activities. This is usually used to diagnose what is causing palpitations/syncope (passing out). 2)  Your physician wants you to follow-up in: 6 months. You will receive a reminder letter in the mail one-two months in advance. If you don't receive a letter, please call our office to schedule the follow-up appointment.

## 2010-04-14 NOTE — Letter (Signed)
Summary: Engineer, materials at [facility]  [address]   [place], [place] [address]   Phone: [phone]  Fax: [phone]        August 14, 2009 MRN: [id number]   Wesley Dyer [address] [place], [place]  [address]   Dear Mr. SMITHEY,  Your test ordered by Selena Batten has been reviewed by your physician (or physician assistant) and was found to be normal or stable. Your physician (or physician assistant) felt no changes were needed at this time.  ____ Echocardiogram  ____ Cardiac Stress Test  ____ Lab Work  ____ Peripheral vascular study of arms, legs or neck  ____ CT scan or X-ray  ____ Lung or Breathing test  __X__ Other:  monitor results normal   Thank you.   Hoover Brunette, LPN    Duane Boston, M.D., F.A.C.C. Thressa Sheller, M.D., F.A.C.C. Oneal Grout, M.D., F.A.C.C. Cheree Ditto, M.D., F.A.C.C. Daiva Nakayama, M.D., F.A.C.C. Kenney Houseman, M.D., F.A.C.C. Jeanne Ivan, PA-C

## 2010-07-28 NOTE — Assessment & Plan Note (Signed)
[facility]                          [facility]ICE NOTE   Wesley Dyer, Wesley Dyer                     MRN:          [id number]  DATE:05/23/2008                            DOB:          1923-06-19    REFERRING PHYSICIAN:  Linward Foster   HISTORY OF PRESENT ILLNESS:  The patient is an 77 year old male with  history of paroxysmal atrial fibrillation.  The patient has maintained  normal sinus rhythm.  On his last office visit, he complaint of  dizziness and weakness.  He was also complaining of depression, was  recently started on antidepressant medications.  His heart rate was 49  beats per minute and we decreased his metoprolol.  We actually changed  him to metoprolol ER 25 mg p.o. daily.  He states that his symptoms have  markedly improved, although on his EKG, he still has sinus bradycardia  with heart rate of 44 beats per minute.   CURRENT MEDICATIONS:  1. Metoprolol ER 25 mg p.o. daily.  2. PreserVision.  3. B12.  4. Simvastatin 20 mg daily.  5. Coumadin as directed.  6. Omeprazole 20 mg p.o. daily.  7. Lexapro 10 mg daily.  8. Levothyroxine 25 mg q.a.m.   PHYSICAL EXAMINATION:  VITAL SIGNS:  Blood pressure 154/71, heart rate  44, and weight 182 pounds.  NECK:  Normal carotid upstroke.  No carotid bruits.  LUNGS:  Clear breath sounds bilaterally.  HEART:  Regular rate and rhythm.  Normal S1 and S2.  No murmurs, rubs,  or gallops.  ABDOMEN:  Soft and nontender.  No rebound or guarding.  Good bowel  sounds.  EXTREMITIES:  No cyanosis, clubbing, or edema.  The patient is alert,  oriented, and grossly nonfocal.   PROBLEMS:  1. Paroxysmal atrial fibrillation in normal sinus rhythm.      a.     Rule out symptomatic bradycardia.      b.     Rule out post conversion pauses.  2. History of transient ischemic attack, high risk for thromboembolic      disease, secondary to atrial fibrillation.  3. Coumadin therapy.  4. Atypical chest  pain.  5. Gastroesophageal reflux disease.  6. Hypothyroidism.  7. Depression.   PLAN:  1. 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  As long as he is in normal sinus rhythm and      his heart rate remains in the high 40s, he should do fine.      However, I am concerned that ultimately and possibly in the near      future he will need a pacemaker particularly in reverse back to      atrial fibrillation.  2. I made otherwise no change in the patient medication.  I will      follow up closely and monitor for further significant symptomatic      bradycardia.     Learta Codding, MD,FACC  Electronically Signed    GED/MedQ  DD: 05/23/2008  DT: 05/24/2008  Job #: [id number]   cc:  Wynelle Cleveland

## 2010-07-28 NOTE — Assessment & Plan Note (Signed)
[facility]                          [facility] NOTE   NAME:Rohr, SOL ENGLERT                     MRN:          [id number]  DATE:07/10/2007                            DOB:          [birth year]    HISTORY OF PRESENT ILLNESS:  The patient is an 77 year old male who was  recently hospitalized with paroxysmal atrial fibrillation.  The patient  has normal LV systolic function.  He has a history of TIAs and was  started on Coumadin.  Plavix was discontinued.  The patient underwent  carotid Dopplers, MRI and MRA which essentially showed no definite  source of embolism or significant abnormality.  Echocardiogram also did  not show definite source of emboli.  The patient had a cardiac monitor  done in the interim, and this demonstrated essentially predominantly  normal sinus rhythm with a couple of episodes of atrial fibrillation.  There was also a single episode of atrial fibrillation that converted to  normal sinus rhythm followed by post conversion pause, although the  patient was not symptomatic with this.  The pause was 2.5 seconds.   MEDICATIONS:  1. Aciphex 20 mg p.o. daily.  2. Metoprolol 25 mg p.o. b.i.d.  3. Simvastatin 20 mg p.o. daily.  4. Coumadin as directed by Dr. Gerhard Munch.   PHYSICAL EXAMINATION:  VITAL SIGNS:  Blood pressure is 127/68, heart  rate 47, weight 174 pounds.  NECK:  Normal carotid upstroke and no carotid bruits.  LUNGS:  Clear breath sounds bilaterally.  HEART:  Regular rate and rhythm.  Normal sinus.  ABDOMEN:  Soft, nontender.  No rebound or guarding.  Good bowel sounds.  EXTREMITIES:  No cyanosis, clubbing, or edema.   PROBLEM LIST:  1. Paroxysmal atrial fibrillation with palpitations, stable.      a.     Single episode with post-conversion to normal sinus rhythm       with an offset pause of 2.5 seconds.      b.     Rare recurrence of atrial fibrillation, albeit,       asymptomatic.  2. History of transient  ischemic attacks, high risk for thromboembolic      disease secondary to #1.  3. Substernal chest pain, atypical.  4. Gastroesophageal reflux disease/esophageal stricture.  5. Hypothyroidism.   PLAN:  1. We reviewed today the patient's cardiac monitor, with details as      outlined above.  I have told the patient that I think he has      symptoms of presyncope or syncope, that he likely may need a      pacemaker, as this could be related to post-conversion pauses into      sinus rhythm, as detailed above; however at this point in time,      there is no clear indication for pacemaker implantation.  2. The patient can continue his current medical regimen.  We will not      increase his beta blocker at the present time secondary to #1.  3. The patient would like to have Coumadin followed in our office due      to  ease of monitoring with fingerstick.  I told him that I would      forward this message to Dr. Gerhard Munch and that I do not think that he      would have any significant objection.   The patient can follow up with [place] in the next couple of months.     Learta Codding, MD,FACC  Electronically Signed    GED/MedQ  DD: 07/10/2007  DT: 07/10/2007  Job #: [id number]

## 2010-07-28 NOTE — Assessment & Plan Note (Signed)
[facility]                         GASTROENTEROLOGY OFFICE NOTE   NAME:Blish, SEAMUS WAREHIME                     MRN:          [id number]  DATE:06/28/2007                            DOB:          19-Sep-1923    PROBLEM:  Choking.   REASON:  Mr. Araki has returned for re-evaluation.  On several  occasions he has had severe dysphagia to solids where he has felt like  choking and that food would not pass.  He does complain of mild  dysphagia to solids at other times.  He denies dysphagia to liquids.  He  does have a remote history of esophageal stricture which has been  dilated.  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  He has had gastric polyps in the past showing hyperplastic  changes only.  He was recently placed on Coumadin for possible atrial  fibrillation.  He was evaluated for chest pain several weeks ago.  He is  currently wearing a heart monitor.  He was evaluated for a possible  stroke with carotid studies.  Question of thyroid disease was raised.   OTHER MEDICATIONS:  Simvastatin, metoprolol, Aciphex and B12.   He has no allergies.   EXAM:  He is a  healthy appearing male.  Pulse 56, blood pressure  130/62, weight 176.  CHEST:  Clear.  There are no cardiac murmurs, gallops or rubs.  ABDOMEN:  Without masses, tenderness or organomegaly.   IMPRESSION:  1. Dysphagia.  I suspect that he has recurrent esophageal stricture.  2. Possible atrial fibrillation - on Coumadin.  This will be held in      anticipation of his study, procedure.   RECOMMENDATIONS:  Upper endoscopy with dilatation as indicated.  Balloon  dilatation will be done, since he has had previous gastric surgery.     Barbette Hair. Arlyce Dice, MD,FACG  Electronically Signed    RDK/MedQ  DD: 06/28/2007  DT: 06/28/2007  Job #: [id number]   cc:   Linward Foster

## 2010-07-28 NOTE — Assessment & Plan Note (Signed)
[facility]                          [facility] NOTE   NAME:Wesley Dyer, Wesley Dyer                     MRN:          [id number]  DATE:03/18/2008                            DOB:          21-Dec-1923    HISTORY OF PRESENT ILLNESS:  The patient is an 77 year old male with a  history of paroxysmal atrial fibrillation.  The patient is currently in  normal sinus rhythm, as a matter of fact, he is in sinus bradycardia.  He does report occasional weak spasms, feels like passing out.  Unfortunately, he is also being feeling quite depressed.  He is to be  started on antidepressant medications.  He denies any chest pain,  shortness of breath, orthopnea, or PND.  His heart rate is quite low by  EKG at 49 beats per minute.   MEDICATIONS:  1. Metoprolol ER 25 mg p.o. daily.  2. B12.  3. Simvastatin 20 mg p.o. daily.  4. Coumadin as directed.  5. Omeprazole 20 mg p.o. daily.  6. Lexapro 10 mg p.o. daily.  7. Levothyroxine 25 mg p.o. q.a.m.   PHYSICAL EXAMINATION:  VITAL SIGNS:  Blood pressure 171/78.  The patient  reports to me that he has white-coat hypertension, heart rate 47 beats  per minute, weight 179 pounds.  NECK:  Normal carotid upstroke, no carotid bruits.  LUNGS:  Clear breath sounds bilaterally.  HEART:  Regular rate and rhythm.  Normal S1, S2.  No murmur, rubs, or  gallops.  ABDOMEN:  Soft, nontender.  No rebound or guarding and good bowel  sounds.  EXTREMITIES:  No cyanosis, clubbing, or edema.  NEURO:  The patient alert, orient, and grossly nonfocal.   PROBLEM LIST:  1. Paroxysmal atrial fibrillation, now in normal sinus rhythm.      a.     Rule out post conversion pauses previously documented at 2.5       seconds.      b.     Rule out symptomatic bradycardia.  2. History of transient ischemic attack, high-risk for terminology      secondary to paroxysmal atrial fibrillation, now in normal sinus      rhythm, on Coumadin.  3.  Atypical chest pain, resolved.  4. Gastroesophageal reflux disease.  5. Hypothyroidism.  6. Depression.   PLAN:  1. The patient is significant with bradycardic.  We first try to cut      back his beta-blocker.  We will have to see if he does not develop      recurrent atrial fibrillation.  I have told the patient that he has      indeed tachy-brady syndrome and this is making symptomatic.  He may      require pacemaker implantation.  Prior to that probably he will      need first a CardioNet monitor for documentation.  The patient did      have a CardioNet monitor approximately 8 months ago.  2. The patient is significantly bothered by depression, which is      playing some part in his symptomatology.  This is followed by Dr.  Arroyo.  3. The patient denies any chest pain, and I do take from a coronary      vascular disease perspective that he is stable.  I have asked the      patient to cut metoprolol ER to 12.5 mg p.o. daily and we will see      how he does with this.     Learta Codding, MD,FACC  Electronically Signed    GED/MedQ  DD: 03/18/2008  DT: 03/19/2008  Job #: [id number]

## 2010-07-31 NOTE — Discharge Summary (Signed)
[facility]  Patient:    Wesley Dyer, Wesley Dyer                     MRN: [id number] Adm. Date:  [id number] Disc. Date: [id number] Attending:  Tempie Donning CC:         Barbette Hair. Arlyce Dice, M.D. [facility]   Discharge Summary  CHIEF COMPLAINT:  Diverticulitis.  HISTORY OF PRESENT ILLNESS: [age]-year-old male admitted for elective colon resection.  Approximately 10 years ago I did a colectomy of his sigmoid colon for diverticulitis.  Since then, he has had several bouts and most recently two required hospitalizations.  CAT scan shows diverticulitis. Elective resection is advised.  Of note, is the fact that he had bowel obstruction several months after his initial operation and then also on that admission, he had a bleeding perforated gastric ulcer treated.  LABORATORY STUDIES:  Pathology:  Sigmoid resection with diverticulosis and diverticulitis, no evidence of malignancy.  Urinalysis:  Negative, CMET normal.  Hemoglobin 13.0, hematocrit 37.0, white count 6,300.  Patient is A negative and no transfusions were given.  A chest x-ray showed no active disease.  An EKG showed some sinus bradycardia.  HOSPITAL COURSE:  The patient had an outpatient prep and on the morning of admission underwent a sigmoid resection and lysis of adhesions of his entire small bowel.  Postoperatively, he did quite well.  His catheter, NG and IVs were discontinued on schedule.  He regained bowel function and was comfortable.  Accordingly, he was discharged on the seventh postoperative day on regular diet, limited activity, wound care instructions, and follow-up given.  DISCHARGE DIAGNOSIS:  Diverticulitis.  OPERATIONS:  02/16/00 - Laparotomy, lysis of adhesions and resection sigmoid colon.  COMPLICATIONS:  Infections.  CONSULTING:  None.  CONDITION AT DISCHARGE:  Good. DD:  02/23/00 TD:  02/23/00 Job: [id number]

## 2010-07-31 NOTE — Assessment & Plan Note (Signed)
[facility]                         GASTROENTEROLOGY OFFICE NOTE   NAME:Wesley Dyer, FAIZON CAPOZZI                     MRN:          [id number]  DATE:04/05/2006                            DOB:          1923-07-24    Mr. Lardizabal has returned for a scheduled follow-up visit.  He has a  history of GERD.  He is status post Billroth II anastomosis and subtotal  gastrectomy for old peptic ulcer disease.  He claims that his reflux  symptoms are well controlled.  He has a history of a stomach polyp and H  pylori for which he was treated.  He informed me that he was  colonoscoped in [place] about a year ago.  He has no other GI complaints.   MEDICATIONS:  AcipHex, Vytorin, and Ocuvite.   PHYSICAL EXAMINATION:  VITAL SIGNS:  Pulse 60, blood pressure 120/70,  weight 186.  HEENT: EOMI. PERRLA. Sclerae are anicteric.  Conjunctivae are pink.  NECK:  Supple without thyromegaly, adenopathy or carotid bruits.  CHEST:  Clear to auscultation and percussion without adventitious  sounds.  CARDIAC:  Regular rhythm; normal S1 S2.  There are no murmurs, gallops  or rubs.  ABDOMEN:  Bowel sounds are normoactive.  Abdomen is soft, non-tender and  non-distended.  There are no abdominal masses, tenderness, splenic  enlargement or hepatomegaly.  EXTREMITIES:  Full range of motion.  No cyanosis, clubbing or edema.  RECTAL:  Deferred.   IMPRESSION:  Gastroesophageal reflux disease - well controlled with  AcipHex.   RECOMMENDATION:  Continue indefinitely.     Barbette Hair. Arlyce Dice, MD,FACG  Electronically Signed    RDK/MedQ  DD: 04/05/2006  DT: 04/05/2006  Job #: [id number]   cc:   Wende Crease, M.D.

## 2010-07-31 NOTE — H&P (Signed)
[facility]  Patient:    Wesley Dyer, Wesley Dyer                     MRN: [id number] Adm. Date:  [id number] Attending:  Tempie Donning                         History and Physical  CHIEF COMPLAINT:  Abdominal pain, recurrent bouts of diverticulitis.  HISTORY OF PRESENT ILLNESS:  [age]-year-old man admitted for elective sigmoid resection.  He underwent sigmoid colectomy by myself for diverticulitis about 20 years ago.  Afterward, several months, he had complications of bowel obstruction that was operated upon and then complicating that was an acute GI bleed/perforation treated by gastric patch and gastrojejunostomy.  Since then, he did fairly well except for occasional bouts of abdominal pain.  This year, he has had at least three documented attacks, one of which required hospitalization here at [facility].  CT scans have been consistent with diverticular disease and his BE showed no obstruction or narrowing, but extended left-sided diverticulosis.  PAST MEDICAL HISTORY:  Significant for the previous diseases; otherwise, occasional orthopedic problems.  MEDICATION:  The only medication he takes is Prevacid.  REVIEW OF SYSTEMS:  Good general health; works in the yard; no significant problems; no cardiac, GU or neurologic problems.  SOCIAL HISTORY:  Married; does not smoke or drink.  PHYSICAL EXAMINATION  VITAL SIGNS:  On the chart.  HEENT:  Head:  Normal.  ENT:  No obstruction or infection.  NECK:  Supple without masses.  CHEST:  Lungs are clear bilaterally.  HEART:  Regular.  ABDOMEN:  Soft.  Well-healed incisions.  EXTREMITIES:  No deformity or edema.  RECTAL:  Deferred -- has been done recently.  IMPRESSION:  Recurrent diverticulitis, needs colon resection.  PLAN:  He has had outpatient bowel prep; will proceed with surgery today. DD:  02/16/00 TD:  02/16/00 Job: [id number]

## 2010-07-31 NOTE — H&P (Signed)
[facility]  Patient:    Wesley Dyer, Wesley Dyer                     MRN: [id number] Adm. Date:  [id number] Attending:  Ephriam Knuckles H                         History and Physical  ADMISSION DIAGNOSIS:  Acute diverticulitis.  HISTORY OF PRESENT ILLNESS:  This 77 year old Caucasian male asked to be transferred from [facility] in [place] today to [place].  He had been seen and operated by Dr. Gita Kudo on two occasions in the past.  He has a long history of diverticulitis dating back 20+ years.  He has had many episodes of acute flare-ups and several episodes of hospitalization.  He was well on Thursday, and became ill on Friday morning upon awakening.  He had abdominal pain and diarrhea x 12-14.  Finally no stools.  Today, on Saturday, Aug 08, 1999, he had nausea. No vomiting, no bowel movements, no passage of flatus.  Because of the nausea and vomiting and change in bowel habits, he went to the [facility], and was transferred here.  He apparently had a colon resection in 1991, for a blockage.  In 1995, he had one surgery to remove scar tissue, and a second surgery for a rupture of a peptic ulcer, the same hospitalization.  He also has a history of GERD, and takes Prevacid one q.d.  He has for several years.  Only other medication is an occasional Aleve.  ALLERGIES:  No known drug allergies.  PAST SURGICAL HISTORY:  As above.  He feels well and healthy for his age.  PHYSICAL EXAMINATION:  GENERAL:  He is indeed healthy and well-appearing.  VITAL SIGNS:  He is said to weight 187 pounds.  Said to be 5 feet 9 inches. Temperature 97.6 degrees, heart rate 66, respirations 20, blood pressure 145/82.  HEENT:  Unremarkable.  NECK:  Free of masses.  CHEST:  Clear to auscultation.  HEART:  Sounds are of normal quality with a regular slow rhythm.  ABDOMEN:  Slightly rotund.  A well-used midline scar is present.  No  herniae noted. He is exquisitely tender in the left flank and left lower quadrant.  No masses appreciated.  The remainder of the abdomen is soft and unremarkable.  Bowel sounds are occasionally present.  GENITOURINARY:  Genitalia:  Adult male, no masses, no abnormalities.  RECTAL:  Not repeated.  Hemoccult is said to be positive by the physician in the emergency room in [place].  KUB shows air fluid levels.  White count elevated at 13,000.  Other laboratory data normal.  ASSESSMENT/PLAN:  He is being admitted for acute diverticulitis.  A CT scan is pending.  He will be followed here at the hospital. DD:  08/08/99 TD:  08/08/99 Job: [id number]

## 2010-07-31 NOTE — Discharge Summary (Signed)
[facility]  Patient:    Wesley Dyer, Wesley Dyer                     MRN: [id number] Adm. Date:  [id number] Disc. Date: [id number] Attending:  Tempie Donning CC:         Gita Kudo, M.D.             Wende Crease, M.D., [place], [place]                           Discharge Summary  CHIEF COMPLAINT:  Diverticulitis.  HISTORY OF PRESENT ILLNESS:  [age]-year-old male transferred from [facility] with left lower quadrant abdominal pain of two days duration.  I resected his sigmoid colon in 1991 for diverticular disease with acute diverticulitis and adherence to the bladder.  He did well for approximately five years, when he had to be operated upon for bowel obstruction by myself.  His hospitalization was complicated by perforation of a gastric ulcer that required surgery.  After a lengthy recovery, he was discharged.  Since then, he has had no further GI problems.  He denies any significant bouts of abdominal pain.  This recent episode concerned him and, after evaluation at [facility], he was sent down for treatment.  LABORATORY DATA:  Initial CBC revealed hemoglobin 14, hematocrit 39, white count 7200.  His white count was 12,000 in [place].  Follow-up white counts were approximately 6100, and H&H remained normal.  CMET showed normal studies except for slightly high glucose of 117.  X-rays were obtained.  He had an initial CT of the abdomen and pelvis that showed findings consistent with a small area of diverticulitis.  A repeat study done three days later showed considerable improvement.  HOSPITAL COURSE:  The patient was admitted and started on intravenous Cefotan and Flagyl.  He became afebrile throughout, abdomen became soft, and he had no significant problems.  He regained bowel function, was ambulatory, and was tolerating clear liquids.  Accordingly, his Cefotan was stopped, and he was placed on oral Flagyl and Cipro on Aug 12, 1999.  He remained  afebrile and, therefore, was discharged on Aug 13, 1999.  He will be maintained on just oral Flagyl for another week.  His diet will be slowly increased to regular over the next four days.  I will check him again in three to four weeks and, at that time, probably consider getting a barium enema to see the extent of his diverticular disease.  At the present time, no surgery is contemplated.  DISCHARGE DIAGNOSIS:  Acute diverticulitis.  OPERATIONS:  None.  COMPLICATIONS:  None.  CONSULTATIONS:  None.  INFECTIONS:  None.  CONDITION ON DISCHARGE:  Good. DD:  08/13/99 TD:  08/18/99 Job: [id number]

## 2010-07-31 NOTE — Op Note (Signed)
[facility]  Patient:    Wesley Dyer, LISENBEE                    MRN: [id number] Proc. Date: 02/16/00 Attending:  Gita Kudo, M.D. CC:         Barbette Hair. Arlyce Dice, M.D. [facility]   Operative Report  PREOPERATIVE DIAGNOSIS:  Recurrent diverticulitis.  POSTOPERATIVE DIAGNOSES:  Recurrent diverticulitis plus multiple matted loops of small bowel from adhesions.  OPERATIVE PROCEDURE:  Exploratory laparotomy, lysis of adhesions, sigmoid resection.  SURGEON:  Gita Kudo, M.D.  ASSISTANT:  Lorne Skeens. Hoxworth, M.D.  ANESTHESIA:  General endotracheal.  CLINICAL SUMMARY:  A 77 year old male with multiple bouts of recurrent diverticulitis, admitted for elective resection.  He underwent a resection for this about 20 years ago.  He was operated several months later for a bowel obstruction and also developed a bleeding perforated ulcer that required surgery.  Since that time he has done fairly well except for recent bouts of diverticulitis and comes in now for surgery.  OPERATIVE FINDINGS:  The patient had intense inflammatory reactions that wee chronic in nature and somewhat dense, and also some were filmy.  The entire sigmoid colon had multiple diverticula.  I did not actually see the anastomosis.  DESCRIPTION OF PROCEDURE:  Under satisfactory general endotracheal anesthesia, having received Cefotan and heparin preoperatively, nasogastric and Foley catheters were placed.  The abdomen was prepped and draped and then a midline incision was made, which was extended the full length of his previous surgical procedures.  A long time was sent taking down adhesions of the bowel to the abdominal wall and then of the bowel to itself.  Care was taken and sharp dissection was used as well as cautery to free the entire small bowel from the ligament of Treitz to the cecum.  We also freed the bowel going up to the stomach but did not disrupt the anastomosis.  Then the  freed small bowel was packed away with good self-retaining exposure, and we identified the sigmoid colon.  We freed it from the pelvis to the splenic flexure.  The splenic flexure was taken down using clamps and ties of 2-0 silk as well as cautery. When finished, the entire left colon lay free from the left transverse colon on down.  Then the distal small bowel was transected with the TA55 stapler. The distal stump was then elevated and dissected down, freeing the distal sigmoid and proximal rectum.  When we were at healthy bowel, we transected that with a bowel clamp and knife and maintained position with stay sutures. That specimen was sent separately.  The proximal portion of the sigmoid colon then was divided between clamps and ties of 2-0 silk up to the descending colon just below the splenic flexure.  This was then transected between clamps.  Following this, a spring clamp was placed proximally and then the bowel brought down without tension, and a hand-sewn interrupted 3-0 silk single-layer anastomosis of simple, Gambee, and mattress sutures was used. When finished, the anastomosis lay without tension and had good blood supply. The clamp was released, and there was no evidence of leak.  Then the mesentery did not need to be approximated except down in the pelvis, where the mesentery was approximated, and over to the rectal portion of the anastomosis to avoid small bowel getting into the pelvis.  Then the abdomen was copiously lavaged with saline.  All operative sites were checked for hemostasis, which was good.  Packs were all removed.  The sponge and needle counts were correct, and the patients abdomen was closed in a single layer with running #1 PDS suture. Skin edges were then approximated with staples and sterile absorbent dressings applied.  There were no complications, and the sponge and needle counts were correct. DD:  02/16/00 TD:  02/16/00 Job: [id number]

## 2010-08-25 ENCOUNTER — Encounter: Payer: Self-pay | Admitting: Cardiology

## 2010-09-08 ENCOUNTER — Ambulatory Visit (INDEPENDENT_AMBULATORY_CARE_PROVIDER_SITE_OTHER): Payer: MEDICARE | Admitting: Cardiology

## 2010-09-08 ENCOUNTER — Encounter: Payer: Self-pay | Admitting: Cardiology

## 2010-09-08 VITALS — BP 127/72 | HR 45 | Ht 68.0 in | Wt 166.0 lb

## 2010-09-08 DIAGNOSIS — I498 Other specified cardiac arrhythmias: Secondary | ICD-10-CM

## 2010-09-08 DIAGNOSIS — F329 Major depressive disorder, single episode, unspecified: Secondary | ICD-10-CM

## 2010-09-08 DIAGNOSIS — R42 Dizziness and giddiness: Secondary | ICD-10-CM

## 2010-09-08 DIAGNOSIS — G459 Transient cerebral ischemic attack, unspecified: Secondary | ICD-10-CM

## 2010-09-08 DIAGNOSIS — F419 Anxiety disorder, unspecified: Secondary | ICD-10-CM | POA: Insufficient documentation

## 2010-09-08 DIAGNOSIS — I4891 Unspecified atrial fibrillation: Secondary | ICD-10-CM

## 2010-09-08 NOTE — Assessment & Plan Note (Signed)
No clinical evidence of recurrent atrial fibrillation although the patient was last monitored in 2011 when he had brief runs of atrial arrhythmia and minimum heart rate of 40 beats per minute. He continues to decline Coumadin because of prior significant bleeding with epistaxis.

## 2010-09-08 NOTE — Assessment & Plan Note (Signed)
Patient continued to decline Coumadin.

## 2010-09-08 NOTE — Assessment & Plan Note (Signed)
Unclear if this is contributing to his symptoms of weakness and fatigue. If CardioNet monitor is negative further evaluation indicated by his primary care physician.

## 2010-09-08 NOTE — Assessment & Plan Note (Signed)
Patient does have significant bradycardia but during muscle to monitor 2011 had borderline criteria for pacemaker implantation. Certainly his symptoms appear to have gotten worse. 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 It is hard to sort out if the patient's symptoms of fatigue and weakness and lack of energy are related to his depression are due to worsening chronotropic insufficiency and sick sinus syndrome. We'll proceed with repeating a cardiac monitor at this time we will provide him with a 21 day CardioNet monitor. If significant episodes of symptomatic bradycardia the patient will be referred for possible pacemaker implantation. We discussed this at great length during his office visit.

## 2010-09-08 NOTE — Patient Instructions (Addendum)
Your physician wants you to follow-up in: 6 months. You will receive a reminder letter in the mail one-two months in advance. If you don't receive a letter, please call our office to schedule the follow-up appointment. Your physician recommends that you continue on your current medications as directed. Please refer to the Current Medication list given to you today. Your physician has recommended that you wear an event monitor. Event monitors are medical devices that record the heart's electrical activity. Doctors most often [place] these monitors to diagnose arrhythmias. Arrhythmias are problems with the speed or rhythm of the heartbeat. The monitor is a small, portable device. You can wear one while you do your normal daily activities. This is usually used to diagnose what is causing palpitations/syncope (passing out).

## 2010-09-08 NOTE — Progress Notes (Signed)
HPI The patient is an 77 year old male with history of paroxysmal atrial fibrillation but has remained in normal sinus rhythm. A prior Holter monitor the patient had significant sinus bradycardia with heart rates to 40 beats per minute and pauses no greater than 2.5 seconds. There were brief runs of atrial arrhythmia. The patient also has a history of proximal atrial fibrillation rate postconversion pauses. He also has a prior history of TIA and is at high risk for thromboembolic disease but has consistently declined more recently Coumadin because of prior history of severe epistaxis. The patient is not willing to restart Coumadin. He does report frequent episodes of dizziness which now appear to be getting worse. His wife is getting quite concerned about this. He also has symptoms of orthostasis. More importantly is lacking significant stamina, feels fatigue and has significant weakness on exertion. The patient does have depression and is on medical therapy. Is not clear if he has some element of chronotropic insufficiency. This could be contributing to his fatigue. Last time when we monitored him a year ago we felt there was no clear indication for pacemaker however, although it does appear that his symptoms of fatigue weakness and lack of energy are getting worse although not clear whether this is also related to his depression.  Allergies  Allergen Reactions  . Warfarin Sodium     REACTION: bleeding    Current Outpatient Prescriptions on File Prior to Visit  Medication Sig Dispense Refill  . Calcium Carbonate-Vitamin D (CALTRATE 600+D) 600-400 MG-UNIT per tablet Take 1 tablet by mouth 2 (two) times daily.        Marland Kitchen escitalopram (LEXAPRO) 20 MG tablet Take 20 mg by mouth daily.        Marland Kitchen levothyroxine (SYNTHROID, LEVOTHROID) 25 MCG tablet Take 25 mcg by mouth daily.        . Multiple Vitamins-Minerals (OCUVITE PRESERVISION) TABS Take 1 tablet by mouth 2 (two) times daily.        Marland Kitchen omeprazole  (PRILOSEC) 20 MG capsule Take 20 mg by mouth daily.        . simvastatin (ZOCOR) 20 MG tablet Take 20 mg by mouth at bedtime.        . vitamin B-12 (CYANOCOBALAMIN) 1000 MCG tablet Take 1,000 mcg by mouth every other day.         Past Medical History  Diagnosis Date  . Atrial fibrillation May 2011    Paroxysmal, sinus bradycardia history of Holter monitor may 2011 with minimum heart rate 40 beats per minute and pauses of 2.5 seconds. Brief runs of atrial arrhythmia  . Chest pain     unspecified  . Gastroesophageal reflux disease   . Hypothyroidism   . Depression   . Paroxysmal atrial fibrillation     in normal sinus rhythm a. Rule out symptomatic bradycardia b. Rule out post conversion pauses  . Transient ischemic attack     high risk for thromboembolic disease, secondary to atrial fibrillation, patient declines Coumadin due to prior history of severe epistaxis. Patient not willing to restart Coumadin therapy  . Drug therapy     Coumadin Therapy  . Chest pain, atypical   . Gastroesophageal reflux disease   . Depression     Past Surgical History  Procedure Date  . Abdominal surgery     Abdominal diverticultis    Family History  Problem Relation Age of Onset  . Coronary artery disease    . Hyperlipidemia      History  Social History  . Marital Status: Married    Spouse Name: N/A    Number of Children: N/A  . Years of Education: N/A   Occupational History  . Retired    Social History Main Topics  . Smoking status: Former Smoker -- 0.5 packs/day for 10 years    Types: Cigarettes  . Smokeless tobacco: Former Neurosurgeon    Types: Chew    Quit date: 03/15/2005  . Alcohol Use: No  . Drug Use: No  . Sexually Active: Not on file   Other Topics Concern  . Not on file   Social History Narrative  . No narrative on file   [id number] positives as outlined above. The remainder of the 18  point review of systems is negativ  PHYSICAL EXAM BP 127/72  Pulse 45  Ht 5\' 8"   (1.727 m)  Wt 166 lb (75.297 kg)  BMI 25.24 kg/m2  SpO2 98%  General: Well-developed, well-nourished in no distress Head: Normocephalic and atraumatic Eyes:PERRLA/EOMI intact, conjunctiva and lids normal Ears: No deformity or lesions Mouth:normal dentition, normal posterior pharynx Neck: Supple, no JVD.  No masses, thyromegaly or abnormal cervical nodes Lungs: Normal breath sounds bilaterally without wheezing.  Normal percussion Cardiac: regular rate and rhythm with normal S1 and S2, no S3 or S4.  PMI is normal.  No pathological murmurs Abdomen: Normal bowel sounds, abdomen is soft and nontender without masses, organomegaly or hernias noted.  No hepatosplenomegaly MSK: Back normal, normal gait muscle strength and tone normal Vascular: Pulse is normal in all 4 extremities Extremities: No peripheral pitting edema Neurologic: Alert and oriented x 3 Skin: Intact without lesions or rashes Lymphatics: No significant adenopathy Psychologic: Depressed affect  ECG: Marked sinus bradycardia with a heart rate of 45 beats per minute no acute ischemic changes.  ASSESSMENT AND PLAN

## 2011-01-25 ENCOUNTER — Encounter (INDEPENDENT_AMBULATORY_CARE_PROVIDER_SITE_OTHER): Payer: MEDICARE | Admitting: Ophthalmology

## 2011-06-03 ENCOUNTER — Encounter (INDEPENDENT_AMBULATORY_CARE_PROVIDER_SITE_OTHER): Payer: MEDICARE | Admitting: Ophthalmology

## 2011-06-03 DIAGNOSIS — H43819 Vitreous degeneration, unspecified eye: Secondary | ICD-10-CM

## 2011-06-03 DIAGNOSIS — H3530 Unspecified macular degeneration: Secondary | ICD-10-CM

## 2011-06-30 DIAGNOSIS — H905 Unspecified sensorineural hearing loss: Secondary | ICD-10-CM | POA: Diagnosis not present

## 2011-07-27 DIAGNOSIS — D649 Anemia, unspecified: Secondary | ICD-10-CM | POA: Diagnosis not present

## 2011-07-27 DIAGNOSIS — E039 Hypothyroidism, unspecified: Secondary | ICD-10-CM | POA: Diagnosis not present

## 2011-07-27 DIAGNOSIS — E559 Vitamin D deficiency, unspecified: Secondary | ICD-10-CM | POA: Diagnosis not present

## 2011-08-04 DIAGNOSIS — I4891 Unspecified atrial fibrillation: Secondary | ICD-10-CM | POA: Diagnosis not present

## 2011-08-04 DIAGNOSIS — E039 Hypothyroidism, unspecified: Secondary | ICD-10-CM | POA: Diagnosis not present

## 2011-08-04 DIAGNOSIS — K4091 Unilateral inguinal hernia, without obstruction or gangrene, recurrent: Secondary | ICD-10-CM | POA: Diagnosis not present

## 2011-08-04 DIAGNOSIS — F329 Major depressive disorder, single episode, unspecified: Secondary | ICD-10-CM | POA: Diagnosis not present

## 2011-08-04 DIAGNOSIS — D649 Anemia, unspecified: Secondary | ICD-10-CM | POA: Diagnosis not present

## 2011-08-04 DIAGNOSIS — K219 Gastro-esophageal reflux disease without esophagitis: Secondary | ICD-10-CM | POA: Diagnosis not present

## 2011-08-04 DIAGNOSIS — M818 Other osteoporosis without current pathological fracture: Secondary | ICD-10-CM | POA: Diagnosis not present

## 2011-09-06 DIAGNOSIS — R300 Dysuria: Secondary | ICD-10-CM | POA: Diagnosis not present

## 2011-11-05 DIAGNOSIS — K219 Gastro-esophageal reflux disease without esophagitis: Secondary | ICD-10-CM | POA: Diagnosis not present

## 2011-11-05 DIAGNOSIS — E039 Hypothyroidism, unspecified: Secondary | ICD-10-CM | POA: Diagnosis not present

## 2011-11-12 DIAGNOSIS — K219 Gastro-esophageal reflux disease without esophagitis: Secondary | ICD-10-CM | POA: Diagnosis not present

## 2011-11-12 DIAGNOSIS — F329 Major depressive disorder, single episode, unspecified: Secondary | ICD-10-CM | POA: Diagnosis not present

## 2011-11-12 DIAGNOSIS — E039 Hypothyroidism, unspecified: Secondary | ICD-10-CM | POA: Diagnosis not present

## 2011-11-12 DIAGNOSIS — I4891 Unspecified atrial fibrillation: Secondary | ICD-10-CM | POA: Diagnosis not present

## 2011-11-12 DIAGNOSIS — M818 Other osteoporosis without current pathological fracture: Secondary | ICD-10-CM | POA: Diagnosis not present

## 2011-11-12 DIAGNOSIS — D649 Anemia, unspecified: Secondary | ICD-10-CM | POA: Diagnosis not present

## 2011-12-16 DIAGNOSIS — Z23 Encounter for immunization: Secondary | ICD-10-CM | POA: Diagnosis not present

## 2012-02-09 DIAGNOSIS — E039 Hypothyroidism, unspecified: Secondary | ICD-10-CM | POA: Diagnosis not present

## 2012-02-09 DIAGNOSIS — D649 Anemia, unspecified: Secondary | ICD-10-CM | POA: Diagnosis not present

## 2012-02-09 DIAGNOSIS — I4891 Unspecified atrial fibrillation: Secondary | ICD-10-CM | POA: Diagnosis not present

## 2012-02-16 DIAGNOSIS — I4891 Unspecified atrial fibrillation: Secondary | ICD-10-CM | POA: Diagnosis not present

## 2012-02-16 DIAGNOSIS — M818 Other osteoporosis without current pathological fracture: Secondary | ICD-10-CM | POA: Diagnosis not present

## 2012-02-16 DIAGNOSIS — F329 Major depressive disorder, single episode, unspecified: Secondary | ICD-10-CM | POA: Diagnosis not present

## 2012-02-16 DIAGNOSIS — D649 Anemia, unspecified: Secondary | ICD-10-CM | POA: Diagnosis not present

## 2012-02-16 DIAGNOSIS — E039 Hypothyroidism, unspecified: Secondary | ICD-10-CM | POA: Diagnosis not present

## 2012-02-16 DIAGNOSIS — K219 Gastro-esophageal reflux disease without esophagitis: Secondary | ICD-10-CM | POA: Diagnosis not present

## 2012-02-16 DIAGNOSIS — H3530 Unspecified macular degeneration: Secondary | ICD-10-CM | POA: Diagnosis not present

## 2012-02-25 DIAGNOSIS — R630 Anorexia: Secondary | ICD-10-CM | POA: Diagnosis not present

## 2012-02-25 DIAGNOSIS — R110 Nausea: Secondary | ICD-10-CM | POA: Diagnosis not present

## 2012-03-23 DIAGNOSIS — F3289 Other specified depressive episodes: Secondary | ICD-10-CM | POA: Diagnosis not present

## 2012-03-23 DIAGNOSIS — F329 Major depressive disorder, single episode, unspecified: Secondary | ICD-10-CM | POA: Diagnosis not present

## 2012-03-23 DIAGNOSIS — E039 Hypothyroidism, unspecified: Secondary | ICD-10-CM | POA: Diagnosis not present

## 2012-03-23 DIAGNOSIS — K219 Gastro-esophageal reflux disease without esophagitis: Secondary | ICD-10-CM | POA: Diagnosis not present

## 2012-03-23 DIAGNOSIS — I4891 Unspecified atrial fibrillation: Secondary | ICD-10-CM | POA: Diagnosis not present

## 2012-03-23 DIAGNOSIS — H3530 Unspecified macular degeneration: Secondary | ICD-10-CM | POA: Diagnosis not present

## 2012-03-23 DIAGNOSIS — M818 Other osteoporosis without current pathological fracture: Secondary | ICD-10-CM | POA: Diagnosis not present

## 2012-03-23 DIAGNOSIS — D649 Anemia, unspecified: Secondary | ICD-10-CM | POA: Diagnosis not present

## 2012-03-23 DIAGNOSIS — R5383 Other fatigue: Secondary | ICD-10-CM | POA: Diagnosis not present

## 2012-04-17 DIAGNOSIS — J019 Acute sinusitis, unspecified: Secondary | ICD-10-CM | POA: Diagnosis not present

## 2012-04-25 DIAGNOSIS — M818 Other osteoporosis without current pathological fracture: Secondary | ICD-10-CM | POA: Diagnosis not present

## 2012-04-25 DIAGNOSIS — K219 Gastro-esophageal reflux disease without esophagitis: Secondary | ICD-10-CM | POA: Diagnosis not present

## 2012-04-25 DIAGNOSIS — D649 Anemia, unspecified: Secondary | ICD-10-CM | POA: Diagnosis not present

## 2012-04-25 DIAGNOSIS — F329 Major depressive disorder, single episode, unspecified: Secondary | ICD-10-CM | POA: Diagnosis not present

## 2012-04-25 DIAGNOSIS — H3530 Unspecified macular degeneration: Secondary | ICD-10-CM | POA: Diagnosis not present

## 2012-04-25 DIAGNOSIS — I4891 Unspecified atrial fibrillation: Secondary | ICD-10-CM | POA: Diagnosis not present

## 2012-04-25 DIAGNOSIS — E039 Hypothyroidism, unspecified: Secondary | ICD-10-CM | POA: Diagnosis not present

## 2012-05-25 DIAGNOSIS — D649 Anemia, unspecified: Secondary | ICD-10-CM | POA: Diagnosis not present

## 2012-06-02 ENCOUNTER — Ambulatory Visit (INDEPENDENT_AMBULATORY_CARE_PROVIDER_SITE_OTHER): Payer: MEDICARE | Admitting: Ophthalmology

## 2012-06-02 DIAGNOSIS — H43819 Vitreous degeneration, unspecified eye: Secondary | ICD-10-CM | POA: Diagnosis not present

## 2012-06-02 DIAGNOSIS — H3530 Unspecified macular degeneration: Secondary | ICD-10-CM | POA: Diagnosis not present

## 2012-07-04 DIAGNOSIS — Z7982 Long term (current) use of aspirin: Secondary | ICD-10-CM | POA: Diagnosis not present

## 2012-07-04 DIAGNOSIS — I2789 Other specified pulmonary heart diseases: Secondary | ICD-10-CM | POA: Diagnosis present

## 2012-07-04 DIAGNOSIS — F17200 Nicotine dependence, unspecified, uncomplicated: Secondary | ICD-10-CM | POA: Diagnosis present

## 2012-07-04 DIAGNOSIS — I4891 Unspecified atrial fibrillation: Secondary | ICD-10-CM | POA: Diagnosis not present

## 2012-07-04 DIAGNOSIS — Z79899 Other long term (current) drug therapy: Secondary | ICD-10-CM | POA: Diagnosis not present

## 2012-07-04 DIAGNOSIS — R079 Chest pain, unspecified: Secondary | ICD-10-CM | POA: Diagnosis not present

## 2012-07-04 DIAGNOSIS — I495 Sick sinus syndrome: Secondary | ICD-10-CM | POA: Diagnosis not present

## 2012-07-04 DIAGNOSIS — K219 Gastro-esophageal reflux disease without esophagitis: Secondary | ICD-10-CM | POA: Diagnosis not present

## 2012-07-04 DIAGNOSIS — Z8673 Personal history of transient ischemic attack (TIA), and cerebral infarction without residual deficits: Secondary | ICD-10-CM | POA: Diagnosis not present

## 2012-07-04 DIAGNOSIS — Z791 Long term (current) use of non-steroidal anti-inflammatories (NSAID): Secondary | ICD-10-CM | POA: Diagnosis not present

## 2012-07-04 DIAGNOSIS — H3530 Unspecified macular degeneration: Secondary | ICD-10-CM | POA: Diagnosis not present

## 2012-07-04 DIAGNOSIS — I519 Heart disease, unspecified: Secondary | ICD-10-CM | POA: Diagnosis present

## 2012-07-04 DIAGNOSIS — E785 Hyperlipidemia, unspecified: Secondary | ICD-10-CM | POA: Diagnosis not present

## 2012-07-04 DIAGNOSIS — E7800 Pure hypercholesterolemia, unspecified: Secondary | ICD-10-CM | POA: Diagnosis present

## 2012-07-04 DIAGNOSIS — R0602 Shortness of breath: Secondary | ICD-10-CM | POA: Diagnosis not present

## 2012-07-04 DIAGNOSIS — I059 Rheumatic mitral valve disease, unspecified: Secondary | ICD-10-CM | POA: Diagnosis present

## 2012-07-04 DIAGNOSIS — E039 Hypothyroidism, unspecified: Secondary | ICD-10-CM | POA: Diagnosis present

## 2012-07-05 ENCOUNTER — Inpatient Hospital Stay (HOSPITAL_COMMUNITY)
Admission: AD | Admit: 2012-07-05 | Discharge: 2012-07-07 | DRG: 244 | Disposition: A | Discharge status: Home / Self Care | Payer: MEDICARE | Source: Other Acute Inpatient Hospital | Attending: Cardiology | Admitting: Cardiology

## 2012-07-05 ENCOUNTER — Encounter (HOSPITAL_COMMUNITY): Payer: Self-pay

## 2012-07-05 ENCOUNTER — Ambulatory Visit (HOSPITAL_COMMUNITY)
Admission: AD | Admit: 2012-07-05 | Payer: MEDICARE | Source: Other Acute Inpatient Hospital | Admitting: Internal Medicine

## 2012-07-05 ENCOUNTER — Other Ambulatory Visit: Payer: Self-pay | Admitting: Physician Assistant

## 2012-07-05 DIAGNOSIS — E039 Hypothyroidism, unspecified: Secondary | ICD-10-CM | POA: Diagnosis present

## 2012-07-05 DIAGNOSIS — I080 Rheumatic disorders of both mitral and aortic valves: Secondary | ICD-10-CM | POA: Diagnosis not present

## 2012-07-05 DIAGNOSIS — I498 Other specified cardiac arrhythmias: Secondary | ICD-10-CM | POA: Diagnosis not present

## 2012-07-05 DIAGNOSIS — E785 Hyperlipidemia, unspecified: Secondary | ICD-10-CM | POA: Diagnosis not present

## 2012-07-05 DIAGNOSIS — I495 Sick sinus syndrome: Secondary | ICD-10-CM | POA: Diagnosis not present

## 2012-07-05 DIAGNOSIS — R0602 Shortness of breath: Secondary | ICD-10-CM | POA: Diagnosis not present

## 2012-07-05 DIAGNOSIS — R001 Bradycardia, unspecified: Secondary | ICD-10-CM

## 2012-07-05 DIAGNOSIS — I369 Nonrheumatic tricuspid valve disorder, unspecified: Secondary | ICD-10-CM | POA: Diagnosis present

## 2012-07-05 DIAGNOSIS — Z950 Presence of cardiac pacemaker: Secondary | ICD-10-CM | POA: Diagnosis not present

## 2012-07-05 DIAGNOSIS — F329 Major depressive disorder, single episode, unspecified: Secondary | ICD-10-CM | POA: Diagnosis present

## 2012-07-05 DIAGNOSIS — F3289 Other specified depressive episodes: Secondary | ICD-10-CM | POA: Diagnosis present

## 2012-07-05 DIAGNOSIS — K219 Gastro-esophageal reflux disease without esophagitis: Secondary | ICD-10-CM | POA: Diagnosis present

## 2012-07-05 DIAGNOSIS — Z8673 Personal history of transient ischemic attack (TIA), and cerebral infarction without residual deficits: Secondary | ICD-10-CM

## 2012-07-05 DIAGNOSIS — R918 Other nonspecific abnormal finding of lung field: Secondary | ICD-10-CM | POA: Diagnosis not present

## 2012-07-05 DIAGNOSIS — I4891 Unspecified atrial fibrillation: Secondary | ICD-10-CM

## 2012-07-05 MED ORDER — LEVOTHYROXINE SODIUM 25 MCG PO TABS
25.0000 ug | ORAL_TABLET | Freq: Every day | ORAL | Status: DC
  Administered 2012-07-06 – 2012-07-07 (×2): 25 ug via ORAL
  Filled 2012-07-05 (×4): qty 1

## 2012-07-05 MED ORDER — ASPIRIN EC 81 MG PO TBEC: 81.0000 mg | DELAYED_RELEASE_TABLET | Freq: Every day | ORAL | Status: DC

## 2012-07-05 MED ORDER — CEFAZOLIN SODIUM-DEXTROSE 2-3 GM-% IV SOLR
2.0000 g | INTRAVENOUS | Status: DC
  Filled 2012-07-05 (×2): qty 50

## 2012-07-05 MED ORDER — SODIUM CHLORIDE 0.9 % IJ SOLN: 3.0000 mL | INTRAMUSCULAR | Status: DC | PRN

## 2012-07-05 MED ORDER — ACETAMINOPHEN 325 MG PO TABS: 650.0000 mg | ORAL_TABLET | ORAL | Status: DC | PRN

## 2012-07-05 MED ORDER — SIMVASTATIN 10 MG PO TABS
10.0000 mg | ORAL_TABLET | Freq: Every day | ORAL | Status: DC
  Administered 2012-07-05 – 2012-07-06 (×2): 10 mg via ORAL
  Filled 2012-07-05 (×3): qty 1

## 2012-07-05 MED ORDER — SODIUM CHLORIDE 0.9 % IV SOLN
INTRAVENOUS | Status: DC
  Administered 2012-07-06: 06:00:00 via INTRAVENOUS

## 2012-07-05 MED ORDER — ASPIRIN EC 81 MG PO TBEC
81.0000 mg | DELAYED_RELEASE_TABLET | Freq: Every day | ORAL | Status: DC
  Administered 2012-07-06 – 2012-07-07 (×2): 81 mg via ORAL
  Filled 2012-07-05 (×3): qty 1

## 2012-07-05 MED ORDER — CHLORHEXIDINE GLUCONATE 4 % EX LIQD
60.0000 mL | Freq: Once | CUTANEOUS | Status: AC
  Administered 2012-07-06: 4 via TOPICAL
  Filled 2012-07-05: qty 60

## 2012-07-05 MED ORDER — SODIUM CHLORIDE 0.9 % IJ SOLN
3.0000 mL | Freq: Two times a day (BID) | INTRAMUSCULAR | Status: DC
  Administered 2012-07-05 – 2012-07-06 (×2): 3 mL via INTRAVENOUS

## 2012-07-05 MED ORDER — SODIUM CHLORIDE 0.9 % IJ SOLN: 3.0000 mL | Freq: Two times a day (BID) | INTRAMUSCULAR | Status: DC

## 2012-07-05 MED ORDER — CHLORHEXIDINE GLUCONATE 4 % EX LIQD
60.0000 mL | Freq: Once | CUTANEOUS | Status: AC
  Administered 2012-07-05: 4 via TOPICAL
  Filled 2012-07-05: qty 60

## 2012-07-05 MED ORDER — SODIUM CHLORIDE 0.9 % IR SOLN
80.0000 mg | Status: DC
  Filled 2012-07-05 (×2): qty 2

## 2012-07-05 MED ORDER — SODIUM CHLORIDE 0.9 % IV SOLN: 250.0000 mL | INTRAVENOUS | Status: DC | PRN

## 2012-07-05 NOTE — Consult Note (Signed)
 ELECTROPHYSIOLOGY CONSULT NOTE  Patient ID: Wesley Dyer MRN: [id number], DOB/AGE: 77/16/1925   Admit date: 07/05/2012 Date of Consult: 07/05/2012  Primary Physician: BURDINE,Mykenna Viele E, MD Primary Cardiologist: previously DeGent, MD; now Katz, MD Reason for Consultation: Tachy-brady syndrome  History of Present Illness Wesley Dyer is a pleasant 79 year old man with PAF, dyslipidemia and hypothyroidism who presented to [facility] yesterday with chest, shoulder and neck pain accompanied by SOB. He describes the sudden onset of chest, shoulder and neck "soreness" on Monday. He had not been doing anything unusual or exertional at the time. He denies palpitations, dizziness, near syncope or syncope. He denies nausea, vomiting or diaphoresis. He was feeling fatigued and weak. He has never had these symptoms before and they were persistent which prompted him to seek medical attention. On presentation to [facility] he was found to be in rapid atrial fibrillation. He was started on low dose carvedilol. Upon further questioning/chart review, he also has history of sinus bradycardia with rates in the 40s at times. While at [facility] he spontaneously converted to junctional bradycardia at 40 bpm. Per Dr. Katz's report, he also experienced pauses up to 7 seconds in duration. Carvedilol was discontinued and he was transferred to [facility].    Wesley Dyer is still quite active and states he is usually "in high gear" most of the time. He enjoys mowing/maintaining his yard, plowing and cutting wood. His wife states he is always on the go. He denies recent illness, fever or chills. He denies history of CAD/MI, valvular heart disease, rheumatic fever or HF. He denies any history of CVA/TIA but his chart reports history of TIA. He has previously not tolerated Coumadin due to severe epistaxis.   Past Medical History Past Medical History  Diagnosis Date  . Atrial fibrillation May 2011    Paroxysmal, sinus bradycardia  history of Holter monitor may 2011 with minimum heart rate 40 beats per minute and pauses of 2.5 seconds. Brief runs of atrial arrhythmia  . Chest pain     unspecified  . Gastroesophageal reflux disease   . Hypothyroidism   . Depression   . Paroxysmal atrial fibrillation     in normal sinus rhythm a. Rule out symptomatic bradycardia b. Rule out post conversion pauses  . Transient ischemic attack     high risk for thromboembolic disease, secondary to atrial fibrillation, patient declines Coumadin due to prior history of severe epistaxis. Patient not willing to restart Coumadin therapy  . Drug therapy     Coumadin Therapy  . Chest pain, atypical   . Gastroesophageal reflux disease   . Depression     Past Surgical History Past Surgical History  Procedure Laterality Date  . Abdominal surgery      Abdominal diverticultis    Allergies/Intolerances Allergies  Allergen Reactions  . Warfarin Sodium     REACTION: bleeding   Inpatient Medications . aspirin EC  81 mg Oral Daily  . [START ON 07/06/2012] levothyroxine  25 mcg Oral QAC breakfast  . simvastatin  10 mg Oral q1800  . sodium chloride  3 mL Intravenous Q12H   Family History Family History  Problem Relation Age of Onset  . Coronary artery disease    . Hyperlipidemia      Social History Social History  . Marital Status: Married   Occupational History  . Retired    Social History Main Topics  . Smoking status: Former Smoker -- 0.50 packs/day for 10 years    Types: Cigarettes  .   Smokeless tobacco: Former User    Types: Chew    Quit date: 03/15/2005  . Alcohol Use: No  . Drug Use: No   Review of Systems General: No chills, fever, night sweats or weight changes  Cardiovascular:  No dyspnea on exertion, edema, orthopnea, palpitations, paroxysmal nocturnal dyspnea Dermatological: No rash, lesions or masses Respiratory: No cough Urologic: No hematuria, dysuria Abdominal: No nausea, vomiting, diarrhea, bright red blood  per rectum, melena, or hematemesis Neurologic: No visual changes, weakness, changes in mental status All other systems reviewed and are otherwise negative except as noted above.  Physical Exam Blood pressure 120/80, pulse 94, temperature 97.8 F (36.6 C), temperature source Oral, resp. rate 18, height 5' 7" (1.702 m), weight 159 lb 9.8 oz (72.4 kg), SpO2 98.00%.  General: Well developed, well appearing 79 year old male in no acute distress. HEENT: Normocephalic, atraumatic. EOMs intact. Sclera nonicteric. Oropharynx clear.  Neck: Supple without bruits. No JVD. Lungs: Respirations regular and unlabored, CTA bilaterally. No wheezes, rales or rhonchi. Heart: RRR. S1, S2 present. No murmurs, rub, S3 or S4. Abdomen: Soft, non-tender, non-distended. BS present x 4 quadrants. No hepatosplenomegaly.  Extremities: No clubbing, cyanosis or edema. DP/PT/Radials 2+ and equal bilaterally. Psych: Normal affect. Neuro: Alert and oriented X 3. Moves all extremities spontaneously. Musculoskeletal: No kyphosis. Skin: Intact. Warm and dry. No rashes or petechiae in exposed areas.   Labs From [facility] 07/04/2012 Sodium 136, potassium 4.5, chloride 101, bicarb 28, BUN 17, Cr 1.12, AST 26, ALT 19, alk phos 63, Mg 1.7 WBC 10,800, Hgb 12.2, Hct 35.6, PLT 158,000 CK 93, CKMB 3.2, troponin 0.02 BNP 381 D-dimer 0.8 TSH 4.86  Radiology/Studies From [facility] 07/04/2012 One view CXR -per report - 1) Linear opacity in the left costophrenic angle with associated blunting most likely reflects an area of atelectasis. Dedicated PA and lateral chest x-ray could confirm if clinically warranted.  2) Aortic atherosclerosis V/Q scan -per report - Very low probability for PE Echocardiogram -per report -   Normal LV size and systolic function, EF 60-65%, no WMAs, mild concentric LVH, grade II diastolic dysfunction, moderate to severe LA dilatation (38 mm), mild AS (mean gradient 10 mmHg, peak gradient 20  mmHg), mild to moderate MR, mild TR, moderate pulmonary hypertension, no effusion, CVP 10 mmHg   12-lead ECG from [facility] on presentation - rapid atrial fibrillation at 113 bpm Telemetry strips from [facility] reviewed - rapid AF with conversion to junctional bradycardia at 40 bpm, post termination pause ~3 seconds; reportedly also had sinus/post termination pauses up to 7 seconds although I do not see any strips  Telemetry here shows rate controlled AF currently; 1 pause in AF, ~3 seconds in duration   Assessment and Plan 1. Tachy-brady syndrome 2. PAF 3. Normal LV systolic function, EF 60-65% 4. Valvular heart disease - mild AS, mild to mod MR, mild TR  Wesley Dyer presents with symptomatic AF and documented bradycardia with rates in the 40s at times. He has post termination pauses but none >4 seconds in duration. However, he will require rate controlling medications for treatment of his symptomatic AF which will most likely aggravate/precipitate bradycardia. Therefore, he meets criteria for PPM implantation. His LV systolic function is normal and his QRS is narrow. Given his history of sinus bradycardia he will need dual chamber PPM. Risks, benefits and alternatives to PPM implantation were discussed in detail with the patient today. These risks include, but are not limited to, bleeding, infection, pneumothorax, perforation, tamponade,   vascular damage, renal failure, lead dislodgement, MI, stroke and death. Wesley Dyer and his wife expressed verbal understanding and agree to proceed.   Dr. Keionna Kinnaird to see Signed, EDMISTEN, BROOKE, PA-C 07/05/2012, 2:09 PM  Patient has paroxysmal asymptomatic atrial fibrillation. He has these are good and poor exercise tolerance and I wonder whether his symptoms correlate with his atrial fibrillation of which he is unaware. Unfortunately he also has significant post termination pauses. He's had some sinus bradycardia. It is appropriate to pursue pacing for  symptomatic bradycardia in the context of atrial fibrillation with a rapid rate for which he is taking a therapy. We have discussed risks and benefits including but not limited to death perforation lead dislodgment and infection. He understands and is willing to proceed.  We'll also discussed the importance of thromboembolic risk reduction therapy. He is currently taking aspirin. Based on the 1 AVERROES trial, bleeding risks were shown to be no greater with apixaban even low-dose aspirin. He is agreeable to this transition which we would undertake at his wound check.  Following pacemaker implantation we will begin him on rate control. We will keep a calendar of good days versus bad days and we'll see if we can correlate with atrial fibrillation. 

## 2012-07-05 NOTE — Progress Notes (Signed)
Pt admitted with rapid afib from [facility] hospital. Was having tachy-brady syndrome with HR in ranging from 40-140s. Pt also having pauses with shortest being 3.43 and longest 4.34 while here at [facility], however it's documented he has had a 7 second pause. Pt completely asymptomatic with these episodes, VSS. EP team on board, pt scheduled for pacemaker tomorrow. Will continue to monitor closely.

## 2012-07-05 NOTE — Progress Notes (Signed)
Pt with 5.62 pause, completely asymptomatic. Alinda Money, PA on call for [name] made aware. No new orders received. Nightshift RN made aware. Will continue to monitor closely.

## 2012-07-06 ENCOUNTER — Inpatient Hospital Stay (HOSPITAL_COMMUNITY): Payer: MEDICARE

## 2012-07-06 ENCOUNTER — Encounter (HOSPITAL_COMMUNITY)
Admission: AD | Disposition: A | Discharge status: Home / Self Care | Payer: Self-pay | Source: Other Acute Inpatient Hospital | Attending: Cardiology

## 2012-07-06 DIAGNOSIS — I495 Sick sinus syndrome: Secondary | ICD-10-CM | POA: Diagnosis not present

## 2012-07-06 DIAGNOSIS — I4891 Unspecified atrial fibrillation: Secondary | ICD-10-CM | POA: Diagnosis not present

## 2012-07-06 DIAGNOSIS — I369 Nonrheumatic tricuspid valve disorder, unspecified: Secondary | ICD-10-CM | POA: Diagnosis not present

## 2012-07-06 DIAGNOSIS — E785 Hyperlipidemia, unspecified: Secondary | ICD-10-CM | POA: Diagnosis not present

## 2012-07-06 DIAGNOSIS — I080 Rheumatic disorders of both mitral and aortic valves: Secondary | ICD-10-CM | POA: Diagnosis not present

## 2012-07-06 DIAGNOSIS — I498 Other specified cardiac arrhythmias: Secondary | ICD-10-CM | POA: Diagnosis not present

## 2012-07-06 HISTORY — PX: PERMANENT PACEMAKER INSERTION: SHX5480

## 2012-07-06 HISTORY — PX: PACEMAKER INSERTION: SHX728

## 2012-07-06 LAB — BASIC METABOLIC PANEL
CO2: 28 mEq/L (ref 19–32)
Calcium: 9.2 mg/dL (ref 8.4–10.5)
Chloride: 102 mEq/L (ref 96–112)
GFR calc Af Amer: 58 mL/min — ABNORMAL LOW (ref >90)
Sodium: 137 mEq/L (ref 135–145)

## 2012-07-06 LAB — CBC
Platelets: 150 10*3/uL (ref 150–400)
RBC: 3.37 MIL/uL — ABNORMAL LOW (ref 4.22–5.81)
RDW: 13.4 % (ref 11.5–15.5)
WBC: 7.2 10*3/uL (ref 4.0–10.5)

## 2012-07-06 SURGERY — PERMANENT PACEMAKER INSERTION
Anesthesia: LOCAL

## 2012-07-06 MED ORDER — SODIUM CHLORIDE 0.9 % IV SOLN
INTRAVENOUS | Status: AC
  Administered 2012-07-06: 10:00:00 via INTRAVENOUS

## 2012-07-06 MED ORDER — CEFAZOLIN SODIUM 1-5 GM-% IV SOLN
1.0000 g | Freq: Four times a day (QID) | INTRAVENOUS | Status: AC
  Administered 2012-07-06 – 2012-07-07 (×3): 1 g via INTRAVENOUS
  Filled 2012-07-06 (×3): qty 50

## 2012-07-06 MED ORDER — MIDAZOLAM HCL 5 MG/5ML IJ SOLN
INTRAMUSCULAR | Status: AC
  Filled 2012-07-06: qty 5

## 2012-07-06 MED ORDER — YOU HAVE A PACEMAKER BOOK
Freq: Once | Status: AC
  Administered 2012-07-07: 06:00:00
  Filled 2012-07-06: qty 1

## 2012-07-06 MED ORDER — ONDANSETRON HCL 4 MG/2ML IJ SOLN: 4.0000 mg | Freq: Four times a day (QID) | INTRAMUSCULAR | Status: DC | PRN

## 2012-07-06 MED ORDER — ACETAMINOPHEN 325 MG PO TABS
325.0000 mg | ORAL_TABLET | ORAL | Status: DC | PRN
  Administered 2012-07-06: 650 mg via ORAL
  Filled 2012-07-06: qty 2

## 2012-07-06 MED ORDER — HEPARIN (PORCINE) IN NACL 2-0.9 UNIT/ML-% IJ SOLN
INTRAMUSCULAR | Status: AC
  Filled 2012-07-06: qty 500

## 2012-07-06 MED ORDER — LIDOCAINE HCL (PF) 1 % IJ SOLN
INTRAMUSCULAR | Status: AC
  Filled 2012-07-06: qty 60

## 2012-07-06 MED ORDER — FENTANYL CITRATE 0.05 MG/ML IJ SOLN
INTRAMUSCULAR | Status: AC
  Filled 2012-07-06: qty 2

## 2012-07-06 NOTE — H&P (View-Only) (Signed)
ELECTROPHYSIOLOGY CONSULT NOTE  Patient ID: Wesley Dyer MRN: [id number], DOB/AGE: Nov 20, 1923   Admit date: 07/05/2012 Date of Consult: 07/05/2012  Primary Physician: Juliette Alcide, MD Primary Cardiologist: previously Andee Lineman, MD; now Myrtis Ser, MD Reason for Consultation: Tachy-brady syndrome  History of Present Illness Wesley Dyer is a pleasant 79 year old man with PAF, dyslipidemia and hypothyroidism who presented to [facility] yesterday with chest, shoulder and neck pain accompanied by SOB. He describes the sudden onset of chest, shoulder and neck "soreness" on Monday. He had not been doing anything unusual or exertional at the time. He denies palpitations, dizziness, near syncope or syncope. He denies nausea, vomiting or diaphoresis. He was feeling fatigued and weak. He has never had these symptoms before and they were persistent which prompted him to seek medical attention. On presentation to [facility] he was found to be in rapid atrial fibrillation. He was started on low dose carvedilol. Upon further questioning/chart review, he also has history of sinus bradycardia with rates in the 40s at times. While at [facility] he spontaneously converted to junctional bradycardia at 40 bpm. Per Dr. Henrietta Hoover report, he also experienced pauses up to 7 seconds in duration. Carvedilol was discontinued and he was transferred to [facility].    Wesley Dyer is still quite active and states he is usually "in high gear" most of the time. He enjoys mowing/maintaining his yard, plowing and cutting wood. His wife states he is always on the go. He denies recent illness, fever or chills. He denies history of CAD/MI, valvular heart disease, rheumatic fever or HF. He denies any history of CVA/TIA but his chart reports history of TIA. He has previously not tolerated Coumadin due to severe epistaxis.   Past Medical History Past Medical History  Diagnosis Date  . Atrial fibrillation May 2011    Paroxysmal, sinus bradycardia  history of Holter monitor may 2011 with minimum heart rate 40 beats per minute and pauses of 2.5 seconds. Brief runs of atrial arrhythmia  . Chest pain     unspecified  . Gastroesophageal reflux disease   . Hypothyroidism   . Depression   . Paroxysmal atrial fibrillation     in normal sinus rhythm a. Rule out symptomatic bradycardia b. Rule out post conversion pauses  . Transient ischemic attack     high risk for thromboembolic disease, secondary to atrial fibrillation, patient declines Coumadin due to prior history of severe epistaxis. Patient not willing to restart Coumadin therapy  . Drug therapy     Coumadin Therapy  . Chest pain, atypical   . Gastroesophageal reflux disease   . Depression     Past Surgical History Past Surgical History  Procedure Laterality Date  . Abdominal surgery      Abdominal diverticultis    Allergies/Intolerances Allergies  Allergen Reactions  . Warfarin Sodium     REACTION: bleeding   Inpatient Medications . aspirin EC  81 mg Oral Daily  . [START ON 07/06/2012] levothyroxine  25 mcg Oral QAC breakfast  . simvastatin  10 mg Oral q1800  . sodium chloride  3 mL Intravenous Q12H   Family History Family History  Problem Relation Age of Onset  . Coronary artery disease    . Hyperlipidemia      Social History Social History  . Marital Status: Married   Occupational History  . Retired    Social History Main Topics  . Smoking status: Former Smoker -- 0.50 packs/day for 10 years    Types: Cigarettes  .  Smokeless tobacco: Former Neurosurgeon    Types: Chew    Quit date: 03/15/2005  . Alcohol Use: No  . Drug Use: No   Review of Systems General: No chills, fever, night sweats or weight changes  Cardiovascular:  No dyspnea on exertion, edema, orthopnea, palpitations, paroxysmal nocturnal dyspnea Dermatological: No rash, lesions or masses Respiratory: No cough Urologic: No hematuria, dysuria Abdominal: No nausea, vomiting, diarrhea, bright red blood  per rectum, melena, or hematemesis Neurologic: No visual changes, weakness, changes in mental status All other systems reviewed and are otherwise negative except as noted above.  Physical Exam Blood pressure 120/80, pulse 94, temperature 97.8 F (36.6 C), temperature source Oral, resp. rate 18, height 5\' 7"  (1.702 m), weight 159 lb 9.8 oz (72.4 kg), SpO2 98.00%.  General: Well developed, well appearing 79 year old male in no acute distress. HEENT: Normocephalic, atraumatic. EOMs intact. Sclera nonicteric. Oropharynx clear.  Neck: Supple without bruits. No JVD. Lungs: Respirations regular and unlabored, CTA bilaterally. No wheezes, rales or rhonchi. Heart: RRR. S1, S2 present. No murmurs, rub, S3 or S4. Abdomen: Soft, non-tender, non-distended. BS present x 4 quadrants. No hepatosplenomegaly.  Extremities: No clubbing, cyanosis or edema. DP/PT/Radials 2+ and equal bilaterally. Psych: Normal affect. Neuro: Alert and oriented X 3. Moves all extremities spontaneously. Musculoskeletal: No kyphosis. Skin: Intact. Warm and dry. No rashes or petechiae in exposed areas.   Labs From [facility] 07/04/2012 Sodium 136, potassium 4.5, chloride 101, bicarb 28, BUN 17, Cr 1.12, AST 26, ALT 19, alk phos 63, Mg 1.7 WBC 10,800, Hgb 12.2, Hct 35.6, PLT 158,000 CK 93, CKMB 3.2, troponin 0.02 BNP 381 D-dimer 0.8 TSH 4.86  Radiology/Studies From [facility] 07/04/2012 One view CXR -per report - 1) Linear opacity in the left costophrenic angle with associated blunting most likely reflects an area of atelectasis. Dedicated PA and lateral chest x-ray could confirm if clinically warranted.  2) Aortic atherosclerosis V/Q scan -per report - Very low probability for PE Echocardiogram -per report -   Normal LV size and systolic function, EF 60-65%, no WMAs, mild concentric LVH, grade II diastolic dysfunction, moderate to severe LA dilatation (38 mm), mild AS (mean gradient 10 mmHg, peak gradient 20  mmHg), mild to moderate MR, mild TR, moderate pulmonary hypertension, no effusion, CVP 10 mmHg   12-lead ECG from [facility] on presentation - rapid atrial fibrillation at 113 bpm Telemetry strips from [place] reviewed - rapid AF with conversion to junctional bradycardia at 40 bpm, post termination pause ~3 seconds; reportedly also had sinus/post termination pauses up to 7 seconds although I do not see any strips  Telemetry here shows rate controlled AF currently; 1 pause in AF, ~3 seconds in duration   Assessment and Plan 1. Tachy-brady syndrome 2. PAF 3. Normal LV systolic function, EF 60-65% 4. Valvular heart disease - mild AS, mild to mod MR, mild TR  Mr. Capri presents with symptomatic AF and documented bradycardia with rates in the 40s at times. He has post termination pauses but none >4 seconds in duration. However, he will require rate controlling medications for treatment of his symptomatic AF which will most likely aggravate/precipitate bradycardia. Therefore, he meets criteria for PPM implantation. His LV systolic function is normal and his QRS is narrow. Given his history of sinus bradycardia he will need dual chamber PPM. Risks, benefits and alternatives to PPM implantation were discussed in detail with the patient today. These risks include, but are not limited to, bleeding, infection, pneumothorax, perforation, tamponade,  vascular damage, renal failure, lead dislodgement, MI, stroke and death. Mr. South and his wife expressed verbal understanding and agree to proceed.   Dr. Graciela Husbands to see Signed, Rick Duff, PA-C 07/05/2012, 2:09 PM  Patient has paroxysmal asymptomatic atrial fibrillation. He has these are good and poor exercise tolerance and I wonder whether his symptoms correlate with his atrial fibrillation of which he is unaware. Unfortunately he also has significant post termination pauses. He's had some sinus bradycardia. It is appropriate to pursue pacing for  symptomatic bradycardia in the context of atrial fibrillation with a rapid rate for which he is taking a therapy. We have discussed risks and benefits including but not limited to death perforation lead dislodgment and infection. He understands and is willing to proceed.  We'll also discussed the importance of thromboembolic risk reduction therapy. He is currently taking aspirin. Based on the 1 AVERROES trial, bleeding risks were shown to be no greater with apixaban even low-dose aspirin. He is agreeable to this transition which we would undertake at his wound check.  Following pacemaker implantation we will begin him on rate control. We will keep a calendar of good days versus bad days and we'll see if we can correlate with atrial fibrillation.

## 2012-07-06 NOTE — CV Procedure (Signed)
Preop DX::sinus node dysfunctyion  Post termination pauses >7 sec Post op DX:: same  Procedure  dual pacemaker implantation  After routine prep and drape, lidocaine was infiltrated in the prepectoral subclavicular region on the left side an incision was made and carried down to later the prepectoral fascia using electrocautery and sharp dissection a pocket was formed similarly. Hemostasis was obtained.  After this, we turned our attention to gaining accessm to the extrathoracic,left subclavian vein. This was accomplished without difficulty and without the aspiration of air or puncture of the artery. 2 separate venipunctures were accomplished; guidewires were placed and retained and sequentially 7 French sheath through which were  passed an [facility] ventricular lead serial number [id number] and an Medtronic 5076 atrial lead serial number [id number] .  The ventricular lead was manipulated to the right ventricular apex with a bipolar R wave was 13, the pacing impedance was 897, the threshold was 0.5 @ 0.5 msec  Current at threshold was  0.8  Ma   The right atrial lead was manipulated to the right atrial appendage  with a bipolar P-wave  2.5, the pacing impedance was 865, the threshold 2@ 0.5 msec   Current at threshold was 2.2  Ma and the current of injury was moderate.   The leads were affixed to the prepectoral fascia and attached to a  Medtronic adapota L pulse generator serial number [id number].  Hemostasis was obtained. The pocket was copiously irrigated with antibiotic containing saline solution. Surgicell placed at cephalad rim of pocket  The leads and the pulse generator were placed in the pocket and affixed to the prepectoral fascia. The wound was then closed in 2 layers in the normal fashion. The wound was washed dried and a benzoin Steri-Strip dressing was applied.  Needle  Count, sponge counts and instrument counts were correct at the end of the procedure .   The patient tolerated the  procedure without apparent complication.  Gerlene Burdock.D.

## 2012-07-06 NOTE — Interval H&P Note (Signed)
History and Physical Interval Note:  07/06/2012 7:07 AM  Wesley Dyer  has presented today for surgery, with the diagnosis of a  The various methods of treatment have been discussed with the patient and family. After consideration of risks, benefits and other options for treatment, the patient has consented to  Procedure(s): PERMANENT PACEMAKER INSERTION (N/A) as a surgical intervention .  The patient's history has been reviewed, patient examined, no change in status, stable for surgery.  I have reviewed the patient's chart and labs.  Questions were answered to the patient's satisfaction.     Sherryl Manges

## 2012-07-07 ENCOUNTER — Inpatient Hospital Stay (HOSPITAL_COMMUNITY): Payer: MEDICARE

## 2012-07-07 DIAGNOSIS — I498 Other specified cardiac arrhythmias: Secondary | ICD-10-CM | POA: Diagnosis not present

## 2012-07-07 DIAGNOSIS — R918 Other nonspecific abnormal finding of lung field: Secondary | ICD-10-CM | POA: Diagnosis not present

## 2012-07-07 DIAGNOSIS — I080 Rheumatic disorders of both mitral and aortic valves: Secondary | ICD-10-CM | POA: Diagnosis not present

## 2012-07-07 DIAGNOSIS — E785 Hyperlipidemia, unspecified: Secondary | ICD-10-CM | POA: Diagnosis not present

## 2012-07-07 DIAGNOSIS — I369 Nonrheumatic tricuspid valve disorder, unspecified: Secondary | ICD-10-CM | POA: Diagnosis not present

## 2012-07-07 DIAGNOSIS — Z950 Presence of cardiac pacemaker: Secondary | ICD-10-CM | POA: Diagnosis not present

## 2012-07-07 DIAGNOSIS — I495 Sick sinus syndrome: Secondary | ICD-10-CM | POA: Diagnosis not present

## 2012-07-07 NOTE — Progress Notes (Signed)
   ELECTROPHYSIOLOGY ROUNDING NOTE    Patient Name: Wesley Dyer Date of Encounter: 07-07-2012    SUBJECTIVE:Patient feels well.  No chest pain or shortness of breath.  Minimal incisional soreness. "I want to go home".  S/p PPM implant 07-06-2012 for symptomatic bradycardia.  TELEMETRY: Reviewed telemetry pt in atrial paced with intrinsic ventricular conduction Filed Vitals:   07/06/12 0500 07/06/12 1500 07/06/12 1900 07/07/12 0448  BP: 98/58 107/62 116/61 115/69  Pulse: 64 60 60 62  Temp: 98.3 F (36.8 C) 98.5 F (36.9 C) 98.3 F (36.8 C) 98.4 F (36.9 C)  TempSrc:    Oral  Resp: 18 17 18 18   Height:      Weight:      SpO2: 96% 97% 97% 95%    Intake/Output Summary (Last 24 hours) at 07/07/12 0646 Last data filed at 07/06/12 1900  Gross per 24 hour  Intake    840 ml  Output    800 ml  Net     40 ml    LABS: Basic Metabolic Panel:  Recent Labs  16/10/96 0550  NA 137  K 4.5  CL 102  CO2 28  GLUCOSE 90  BUN 27*  CREATININE 1.25  CALCIUM 9.2   CBC:  Recent Labs  07/06/12 0550  WBC 7.2  HGB 10.7*  HCT 31.3*  MCV 92.9  PLT 150    Radiology/Studies:  Final result pending, leads in stable position.  PHYSICAL EXAM Left chest without hematoma or ecchymosis.   DEVICE INTERROGATION: Device interrogation pending  Wound care, arm mobility, restrictions reviewed with patient.  Routine follow up scheduled in [place] office.

## 2012-07-07 NOTE — Discharge Summary (Signed)
ELECTROPHYSIOLOGY DISCHARGE SUMMARY    Patient ID: Wesley Dyer,  MRN: [id number], DOB/AGE: 77-Aug-1925 77 y.o.  Admit date: 07/05/2012 Discharge date: 07/07/2012  Primary Care Physician: Quintin Alto, MD Primary Cardiologist: Myrtis Ser, MD  Primary Discharge Diagnosis:  1. Tachy-brady syndrome with post termination pauses s/p PPM implantation 2. PAF  Secondary Discharge Diagnoses:  1. Hypothyroidism 2. History of TIA 3. Depression 4. GERD 5. History of severe epistaxis on Coumadin  Procedures This Admission:  1. Dual chamber PPM implantation 07/06/2012 RA lead - Medtronic 5076 atrial lead serial number [id number]  RV lead - St Jude 1948 ventricular lead serial number [id number]  Device - Medtronic Adapta L pulse generator serial number [id number]  History: Mr. Tobler is a pleasant 79 year old man with PAF, dyslipidemia and hypothyroidism who presented to [facility] yesterday with chest, shoulder and neck pain accompanied by SOB. He describes the sudden onset of chest, shoulder and neck "soreness" on Monday. He had not been doing anything unusual or exertional at the time. He denies palpitations, dizziness, near syncope or syncope. He denies nausea, vomiting or diaphoresis. He was feeling fatigued and weak. He has never had these symptoms before and they were persistent which prompted him to seek medical attention. On presentation to [facility] he was found to be in rapid atrial fibrillation. He was started on low dose carvedilol. Upon further questioning/chart review, he also has history of sinus bradycardia with rates in the 40s at times. While at [facility] he spontaneously converted to junctional bradycardia at 40 bpm. He also experienced pauses up to 7 seconds in duration. Carvedilol was discontinued and he was transferred to [facility]   Hospital Course:  Mr. Spisak was admitted to [facility] telemetry and remained hemodynamically stable. He underwent dual chamber PPM implantation on  07/06/2012. He tolerated this procedure well without any immediate complication. He remains hemodynamically stable and afebrile. His chest xray shows stable lead placement without pneumothorax. His device interrogation shows normal PPM function with stable lead parameters/measurements. His implant site is intact without significant bleeding or hematoma. He has been given discharge instructions including wound care and activity restrictions. He will follow-up in 10 days for wound check. OF NOTE, Dr. Graciela Husbands recommended he start Eliquis at his wound check visit. He has been seen, examined and deemed stable for discharge today by Dr. Berton Mount.  Discharge Vitals: Blood pressure 115/69, pulse 62, temperature 98.4 F (36.9 C), temperature source Oral, resp. rate 18, height 5\' 7"  (1.702 m), weight 159 lb 9.8 oz (72.4 kg), SpO2 95.00%.   Labs: Lab Results  Component Value Date   WBC 7.2 07/06/2012   HGB 10.7* 07/06/2012   HCT 31.3* 07/06/2012   MCV 92.9 07/06/2012   PLT 150 07/06/2012    Recent Labs Lab 07/06/12 0550  NA 137  K 4.5  CL 102  CO2 28  BUN 27*  CREATININE 1.25  CALCIUM 9.2  GLUCOSE 90    Disposition:  The patient is being discharged in stable condition.  Follow-up: Follow-up Information   Follow up with [facility] (near [place]) On 07/21/2012. (At 10:00 AM for wound check)    Contact information:   [facility] office [address] [place] [address] [phone]      Follow up with Hillis Range, MD On 10/26/2012. (At 1:30 PM)    Contact information:    [facility] office [address], [address] [place] [address] [phone]  Discharge Medications:    Medication List    ASK your doctor about these medications       aspirin 81 MG tablet  Take 81 mg by mouth daily.     levothyroxine 25 MCG tablet  Commonly known as:  SYNTHROID, LEVOTHROID  Take 25 mcg by mouth daily.     naproxen sodium 220 MG tablet  Commonly  known as:  ANAPROX  Take 220 mg by mouth as needed.     Ocuvite PreserVision Tabs  Take 1 tablet by mouth 2 (two) times daily.     simvastatin 20 MG tablet  Commonly known as:  ZOCOR  Take 20 mg by mouth at bedtime.       Duration of Discharge Encounter: Greater than 30 minutes including physician time.  Signed, Rick Duff, PA-C 07/07/2012, 8:41 AM

## 2012-07-11 DIAGNOSIS — E039 Hypothyroidism, unspecified: Secondary | ICD-10-CM | POA: Diagnosis not present

## 2012-07-11 DIAGNOSIS — M818 Other osteoporosis without current pathological fracture: Secondary | ICD-10-CM | POA: Diagnosis not present

## 2012-07-11 DIAGNOSIS — I495 Sick sinus syndrome: Secondary | ICD-10-CM | POA: Diagnosis not present

## 2012-07-11 DIAGNOSIS — D649 Anemia, unspecified: Secondary | ICD-10-CM | POA: Diagnosis not present

## 2012-07-11 DIAGNOSIS — I4891 Unspecified atrial fibrillation: Secondary | ICD-10-CM | POA: Diagnosis not present

## 2012-07-11 DIAGNOSIS — K219 Gastro-esophageal reflux disease without esophagitis: Secondary | ICD-10-CM | POA: Diagnosis not present

## 2012-07-11 DIAGNOSIS — F329 Major depressive disorder, single episode, unspecified: Secondary | ICD-10-CM | POA: Diagnosis not present

## 2012-07-21 ENCOUNTER — Encounter: Payer: Self-pay | Admitting: Internal Medicine

## 2012-07-21 ENCOUNTER — Other Ambulatory Visit: Payer: Self-pay | Admitting: Cardiology

## 2012-07-21 ENCOUNTER — Ambulatory Visit (INDEPENDENT_AMBULATORY_CARE_PROVIDER_SITE_OTHER): Payer: MEDICARE | Admitting: *Deleted

## 2012-07-21 ENCOUNTER — Other Ambulatory Visit: Payer: Self-pay

## 2012-07-21 DIAGNOSIS — I498 Other specified cardiac arrhythmias: Secondary | ICD-10-CM | POA: Diagnosis not present

## 2012-07-21 DIAGNOSIS — I4891 Unspecified atrial fibrillation: Secondary | ICD-10-CM

## 2012-07-21 LAB — PACEMAKER DEVICE OBSERVATION
AL IMPEDENCE PM: 664 Ohm
AL THRESHOLD: 0.5 V
RV LEAD AMPLITUDE: 11.2 mv
RV LEAD IMPEDENCE PM: 683 Ohm

## 2012-07-21 MED ORDER — APIXABAN 5 MG PO TABS: 5.0000 mg | ORAL_TABLET | Freq: Two times a day (BID) | ORAL | Status: DC

## 2012-07-21 NOTE — Progress Notes (Signed)
Wound check ppm in clinic. Normal device function. Pt to start Eliquis per SK at wound check. Samples given and RX sent to [facility] on [address]. ROV in 3 mths with [facility] in [place].

## 2012-09-19 DIAGNOSIS — D649 Anemia, unspecified: Secondary | ICD-10-CM | POA: Diagnosis not present

## 2012-09-19 DIAGNOSIS — I4891 Unspecified atrial fibrillation: Secondary | ICD-10-CM | POA: Diagnosis not present

## 2012-09-19 DIAGNOSIS — K219 Gastro-esophageal reflux disease without esophagitis: Secondary | ICD-10-CM | POA: Diagnosis not present

## 2012-09-19 DIAGNOSIS — E039 Hypothyroidism, unspecified: Secondary | ICD-10-CM | POA: Diagnosis not present

## 2012-09-21 DIAGNOSIS — M818 Other osteoporosis without current pathological fracture: Secondary | ICD-10-CM | POA: Diagnosis not present

## 2012-09-21 DIAGNOSIS — I495 Sick sinus syndrome: Secondary | ICD-10-CM | POA: Diagnosis not present

## 2012-09-21 DIAGNOSIS — E039 Hypothyroidism, unspecified: Secondary | ICD-10-CM | POA: Diagnosis not present

## 2012-09-21 DIAGNOSIS — D649 Anemia, unspecified: Secondary | ICD-10-CM | POA: Diagnosis not present

## 2012-09-21 DIAGNOSIS — I4891 Unspecified atrial fibrillation: Secondary | ICD-10-CM | POA: Diagnosis not present

## 2012-09-21 DIAGNOSIS — K219 Gastro-esophageal reflux disease without esophagitis: Secondary | ICD-10-CM | POA: Diagnosis not present

## 2012-09-21 DIAGNOSIS — F329 Major depressive disorder, single episode, unspecified: Secondary | ICD-10-CM | POA: Diagnosis not present

## 2012-09-26 DIAGNOSIS — I495 Sick sinus syndrome: Secondary | ICD-10-CM | POA: Diagnosis not present

## 2012-09-26 DIAGNOSIS — F329 Major depressive disorder, single episode, unspecified: Secondary | ICD-10-CM | POA: Diagnosis not present

## 2012-09-26 DIAGNOSIS — R300 Dysuria: Secondary | ICD-10-CM | POA: Diagnosis not present

## 2012-09-26 DIAGNOSIS — M6281 Muscle weakness (generalized): Secondary | ICD-10-CM | POA: Diagnosis not present

## 2012-09-26 DIAGNOSIS — E039 Hypothyroidism, unspecified: Secondary | ICD-10-CM | POA: Diagnosis not present

## 2012-09-26 DIAGNOSIS — I4891 Unspecified atrial fibrillation: Secondary | ICD-10-CM | POA: Diagnosis not present

## 2012-09-29 DIAGNOSIS — R5381 Other malaise: Secondary | ICD-10-CM | POA: Diagnosis not present

## 2012-09-29 DIAGNOSIS — R269 Unspecified abnormalities of gait and mobility: Secondary | ICD-10-CM | POA: Diagnosis not present

## 2012-10-13 DIAGNOSIS — I4891 Unspecified atrial fibrillation: Secondary | ICD-10-CM | POA: Diagnosis not present

## 2012-10-13 DIAGNOSIS — N452 Orchitis: Secondary | ICD-10-CM | POA: Diagnosis not present

## 2012-10-13 DIAGNOSIS — E039 Hypothyroidism, unspecified: Secondary | ICD-10-CM | POA: Diagnosis not present

## 2012-10-13 DIAGNOSIS — N400 Enlarged prostate without lower urinary tract symptoms: Secondary | ICD-10-CM | POA: Diagnosis not present

## 2012-10-13 DIAGNOSIS — R351 Nocturia: Secondary | ICD-10-CM | POA: Diagnosis not present

## 2012-10-13 DIAGNOSIS — R300 Dysuria: Secondary | ICD-10-CM | POA: Diagnosis not present

## 2012-10-13 DIAGNOSIS — I495 Sick sinus syndrome: Secondary | ICD-10-CM | POA: Diagnosis not present

## 2012-10-16 ENCOUNTER — Telehealth: Payer: Self-pay | Admitting: Internal Medicine

## 2012-10-16 NOTE — Telephone Encounter (Signed)
Feeling better now than he was this morning.  Has OV scheduled with Dr. Johney Frame 8/14.  Did see PMD last Thursday, had prostate infection & was given Flomax & Doxycycline.  Not sure if these symptoms could be coming from the Eliquis.  Just feels fatigued, weakness, dizziness occasionally.  Does have BP monitor & readings have been looking good.  Advised that message will be sent to provider for advice.  In the mean time, if symptoms worsen - go to PMD or ED if necessary.  Wife Camera operator) verbalized understanding.

## 2012-10-16 NOTE — Telephone Encounter (Signed)
Mr. Wesley Dyer is feeling very weak today. His wife states that he can not even walk. States has been feeling weak for several weeks Now.

## 2012-10-26 ENCOUNTER — Encounter: Payer: Self-pay | Admitting: Internal Medicine

## 2012-10-26 ENCOUNTER — Ambulatory Visit (INDEPENDENT_AMBULATORY_CARE_PROVIDER_SITE_OTHER): Payer: MEDICARE | Admitting: Internal Medicine

## 2012-10-26 VITALS — BP 103/59 | HR 89 | Ht 68.0 in | Wt 158.8 lb

## 2012-10-26 DIAGNOSIS — I498 Other specified cardiac arrhythmias: Secondary | ICD-10-CM

## 2012-10-26 DIAGNOSIS — I4891 Unspecified atrial fibrillation: Secondary | ICD-10-CM | POA: Diagnosis not present

## 2012-10-26 DIAGNOSIS — I495 Sick sinus syndrome: Secondary | ICD-10-CM | POA: Insufficient documentation

## 2012-10-26 LAB — PACEMAKER DEVICE OBSERVATION
AL IMPEDENCE PM: 664 Ohm
AL THRESHOLD: 0.5 V
ATRIAL PACING PM: 49
BAMS-0001: 150 {beats}/min
BATTERY VOLTAGE: 2.8 V

## 2012-10-26 NOTE — Patient Instructions (Signed)
Continue all current medications. 9 months - [place]

## 2012-10-26 NOTE — Progress Notes (Signed)
PCP: Juliette Alcide, MD Primary Cardiologist:  Dr Rayburn Go is a 79 y.o. male who presents today for routine electrophysiology followup.  Since his pacemaker implantation, the patient reports doing very well.  He has occasional fatigue but is active overall.  He feels "much better" since his PPM was implanted.  Today, he denies symptoms of palpitations, chest pain, shortness of breath,  lower extremity edema, dizziness, presyncope, or syncope.  The patient is otherwise without complaint today.   Past Medical History  Diagnosis Date  . Paroxysmal atrial fibrillation May 2011  . Chest pain     unspecified  . Gastroesophageal reflux disease   . Hypothyroidism   . Depression   . Transient ischemic attack     high risk for thromboembolic disease, secondary to atrial fibrillation, patient declines Coumadin due to prior history of severe epistaxis. Patient not willing to restart Coumadin therapy  . Drug therapy     Coumadin Therapy  . Chest pain, atypical   . Gastroesophageal reflux disease   . Depression   . Tachycardia-bradycardia syndrome     7 second pauses documented   Past Surgical History  Procedure Laterality Date  . Abdominal surgery      Abdominal diverticultis  . Pacemaker insertion  07/06/12    MDT Adapta L implanted by Dr Graciela Husbands    Current Outpatient Prescriptions  Medication Sig Dispense Refill  . apixaban (ELIQUIS) 5 MG TABS tablet Take 1 tablet (5 mg total) by mouth 2 (two) times daily.  60 tablet  3  . levothyroxine (SYNTHROID, LEVOTHROID) 25 MCG tablet Take 25 mcg by mouth daily.        . Multiple Vitamins-Minerals (OCUVITE PRESERVISION) TABS Take 1 tablet by mouth 2 (two) times daily.        . naproxen sodium (ANAPROX) 220 MG tablet Take 220 mg by mouth as needed.      Marland Kitchen omeprazole (PRILOSEC) 20 MG capsule Take 20 mg by mouth daily.       . simvastatin (ZOCOR) 20 MG tablet Take 20 mg by mouth at bedtime.        . tamsulosin (FLOMAX) 0.4 MG CAPS capsule  Take 0.4 mg by mouth daily after supper.        No current facility-administered medications for this visit.    Physical Exam: Filed Vitals:   10/26/12 1326  BP: 103/59  Pulse: 89  Height: 5\' 8"  (1.727 m)  Weight: 158 lb 12.8 oz (72.031 kg)    GEN- The patient is elderly appearing, alert and oriented x 3 today.   Head- normocephalic, atraumatic Eyes-  Sclera clear, conjunctiva pink Ears- hearing intact Oropharynx- clear Lungs- Clear to ausculation bilaterally, normal work of breathing Chest- pacemaker pocket is well healed Heart- Regular rate and rhythm, no murmurs, rubs or gallops, PMI not laterally displaced GI- soft, NT, ND, + BS Extremities- no clubbing, cyanosis, or edema  Pacemaker interrogation- reviewed in detail today,  See PACEART report  Assessment and Plan:  1. Symptomatic Bradycardia/ tachybrady syndrome Normal pacemaker function See Pace Art report No changes today  2. Paroxysmal afib afib burden 0.6% by PPM interrogation today Continue eliquis  Return in 9 months

## 2012-11-14 DIAGNOSIS — M545 Low back pain: Secondary | ICD-10-CM | POA: Diagnosis not present

## 2012-11-14 DIAGNOSIS — N400 Enlarged prostate without lower urinary tract symptoms: Secondary | ICD-10-CM | POA: Diagnosis not present

## 2012-11-14 DIAGNOSIS — R351 Nocturia: Secondary | ICD-10-CM | POA: Diagnosis not present

## 2012-11-14 DIAGNOSIS — I495 Sick sinus syndrome: Secondary | ICD-10-CM | POA: Diagnosis not present

## 2012-11-14 DIAGNOSIS — R300 Dysuria: Secondary | ICD-10-CM | POA: Diagnosis not present

## 2012-11-14 DIAGNOSIS — I4891 Unspecified atrial fibrillation: Secondary | ICD-10-CM | POA: Diagnosis not present

## 2012-11-14 DIAGNOSIS — N452 Orchitis: Secondary | ICD-10-CM | POA: Diagnosis not present

## 2012-11-14 DIAGNOSIS — E039 Hypothyroidism, unspecified: Secondary | ICD-10-CM | POA: Diagnosis not present

## 2013-01-09 DIAGNOSIS — D649 Anemia, unspecified: Secondary | ICD-10-CM | POA: Diagnosis not present

## 2013-01-09 DIAGNOSIS — E039 Hypothyroidism, unspecified: Secondary | ICD-10-CM | POA: Diagnosis not present

## 2013-01-09 DIAGNOSIS — H3530 Unspecified macular degeneration: Secondary | ICD-10-CM | POA: Diagnosis not present

## 2013-01-09 DIAGNOSIS — I4891 Unspecified atrial fibrillation: Secondary | ICD-10-CM | POA: Diagnosis not present

## 2013-01-15 DIAGNOSIS — I4891 Unspecified atrial fibrillation: Secondary | ICD-10-CM | POA: Diagnosis not present

## 2013-01-15 DIAGNOSIS — Z23 Encounter for immunization: Secondary | ICD-10-CM | POA: Diagnosis not present

## 2013-01-15 DIAGNOSIS — I495 Sick sinus syndrome: Secondary | ICD-10-CM | POA: Diagnosis not present

## 2013-01-15 DIAGNOSIS — E039 Hypothyroidism, unspecified: Secondary | ICD-10-CM | POA: Diagnosis not present

## 2013-01-15 DIAGNOSIS — R351 Nocturia: Secondary | ICD-10-CM | POA: Diagnosis not present

## 2013-01-16 ENCOUNTER — Other Ambulatory Visit: Payer: Self-pay | Admitting: *Deleted

## 2013-01-16 DIAGNOSIS — I4891 Unspecified atrial fibrillation: Secondary | ICD-10-CM

## 2013-01-16 MED ORDER — APIXABAN 5 MG PO TABS: 5.0000 mg | ORAL_TABLET | Freq: Two times a day (BID) | ORAL | Status: DC

## 2013-04-09 DIAGNOSIS — H353190 Nonexudative age-related macular degeneration, unspecified eye, stage unspecified: Secondary | ICD-10-CM | POA: Diagnosis not present

## 2013-04-09 DIAGNOSIS — H43819 Vitreous degeneration, unspecified eye: Secondary | ICD-10-CM | POA: Diagnosis not present

## 2013-04-24 DIAGNOSIS — F3289 Other specified depressive episodes: Secondary | ICD-10-CM | POA: Diagnosis not present

## 2013-04-24 DIAGNOSIS — I495 Sick sinus syndrome: Secondary | ICD-10-CM | POA: Diagnosis not present

## 2013-04-24 DIAGNOSIS — H3530 Unspecified macular degeneration: Secondary | ICD-10-CM | POA: Diagnosis not present

## 2013-04-24 DIAGNOSIS — R351 Nocturia: Secondary | ICD-10-CM | POA: Diagnosis not present

## 2013-04-24 DIAGNOSIS — N400 Enlarged prostate without lower urinary tract symptoms: Secondary | ICD-10-CM | POA: Diagnosis not present

## 2013-04-24 DIAGNOSIS — I4891 Unspecified atrial fibrillation: Secondary | ICD-10-CM | POA: Diagnosis not present

## 2013-04-24 DIAGNOSIS — E039 Hypothyroidism, unspecified: Secondary | ICD-10-CM | POA: Diagnosis not present

## 2013-04-24 DIAGNOSIS — D649 Anemia, unspecified: Secondary | ICD-10-CM | POA: Diagnosis not present

## 2013-04-24 DIAGNOSIS — F329 Major depressive disorder, single episode, unspecified: Secondary | ICD-10-CM | POA: Diagnosis not present

## 2013-06-04 ENCOUNTER — Ambulatory Visit (INDEPENDENT_AMBULATORY_CARE_PROVIDER_SITE_OTHER): Payer: Self-pay | Admitting: Ophthalmology

## 2013-07-03 DIAGNOSIS — L851 Acquired keratosis [keratoderma] palmaris et plantaris: Secondary | ICD-10-CM | POA: Diagnosis not present

## 2013-07-03 DIAGNOSIS — I739 Peripheral vascular disease, unspecified: Secondary | ICD-10-CM | POA: Diagnosis not present

## 2013-07-03 DIAGNOSIS — L609 Nail disorder, unspecified: Secondary | ICD-10-CM | POA: Diagnosis not present

## 2013-07-16 ENCOUNTER — Encounter: Payer: Self-pay | Admitting: Internal Medicine

## 2013-07-16 ENCOUNTER — Ambulatory Visit (INDEPENDENT_AMBULATORY_CARE_PROVIDER_SITE_OTHER): Payer: MEDICARE | Admitting: Internal Medicine

## 2013-07-16 VITALS — BP 118/65 | HR 59 | Ht 68.0 in | Wt 168.0 lb

## 2013-07-16 DIAGNOSIS — R5381 Other malaise: Secondary | ICD-10-CM | POA: Diagnosis not present

## 2013-07-16 DIAGNOSIS — R5383 Other fatigue: Secondary | ICD-10-CM

## 2013-07-16 DIAGNOSIS — I495 Sick sinus syndrome: Secondary | ICD-10-CM | POA: Diagnosis not present

## 2013-07-16 DIAGNOSIS — I498 Other specified cardiac arrhythmias: Secondary | ICD-10-CM | POA: Diagnosis not present

## 2013-07-16 DIAGNOSIS — I4891 Unspecified atrial fibrillation: Secondary | ICD-10-CM | POA: Diagnosis not present

## 2013-07-16 LAB — MDC_IDC_ENUM_SESS_TYPE_INCLINIC
Battery Impedance: 108 Ohm
Battery Remaining Longevity: 154 mo
Battery Voltage: 2.8 V
Brady Statistic AP VP Percent: 0 %
Brady Statistic AS VP Percent: 0 %
Lead Channel Impedance Value: 601 Ohm
Lead Channel Pacing Threshold Amplitude: 0.75 V
Lead Channel Sensing Intrinsic Amplitude: 0.7 mV
Lead Channel Sensing Intrinsic Amplitude: 11.2 mV
Lead Channel Setting Pacing Pulse Width: 0.4 ms
MDC IDC MSMT LEADCHNL RA PACING THRESHOLD PULSEWIDTH: 0.4 ms
MDC IDC MSMT LEADCHNL RV IMPEDANCE VALUE: 634 Ohm
MDC IDC MSMT LEADCHNL RV PACING THRESHOLD AMPLITUDE: 0.75 V
MDC IDC MSMT LEADCHNL RV PACING THRESHOLD PULSEWIDTH: 0.4 ms
MDC IDC SESS DTM: 20150504120537
MDC IDC SET LEADCHNL RA PACING AMPLITUDE: 2 V
MDC IDC SET LEADCHNL RV PACING AMPLITUDE: 2.5 V
MDC IDC SET LEADCHNL RV SENSING SENSITIVITY: 4 mV
MDC IDC STAT BRADY AP VS PERCENT: 49 %
MDC IDC STAT BRADY AS VS PERCENT: 51 %

## 2013-07-16 NOTE — Progress Notes (Signed)
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 male who presents today for routine electrophysiology followup.  He continues to complain of fatigue.  He feels that this is a little worse and is disappointed that his pacemaker hasn't improved his symptoms further.  Today, he denies symptoms of palpitations, chest pain, shortness of breath,  lower extremity edema, dizziness, presyncope, or syncope.  The patient is otherwise without complaint today.   Past Medical History  Diagnosis Date  . Paroxysmal atrial fibrillation May 2011  . Chest pain     unspecified  . Gastroesophageal reflux disease   . Hypothyroidism   . Depression   . Transient ischemic attack     high risk for thromboembolic disease, secondary to atrial fibrillation, patient declines Coumadin due to prior history of severe epistaxis. Patient not willing to restart Coumadin therapy  . Drug therapy     Coumadin Therapy  . Chest pain, atypical   . Gastroesophageal reflux disease   . Depression   . Tachycardia-bradycardia syndrome     7 second pauses documented   Past Surgical History  Procedure Laterality Date  . Abdominal surgery      Abdominal diverticultis  . Pacemaker insertion  07/06/12    MDT Adapta L implanted by Dr Caryl Comes    Current Outpatient Prescriptions  Medication Sig Dispense Refill  . apixaban (ELIQUIS) 5 MG TABS tablet Take 1 tablet (5 mg total) by mouth 2 (two) times daily.  60 tablet  6  . levothyroxine (SYNTHROID, LEVOTHROID) 25 MCG tablet Take 25 mcg by mouth daily.        . Multiple Vitamins-Minerals (OCUVITE PRESERVISION) TABS Take 1 tablet by mouth 2 (two) times daily.        . naproxen sodium (ANAPROX) 220 MG tablet Take 220 mg by mouth as needed.      Marland Kitchen omeprazole (PRILOSEC) 20 MG capsule Take 20 mg by mouth daily.       . simvastatin (ZOCOR) 20 MG tablet Take 20 mg by mouth at bedtime.        . tamsulosin (FLOMAX) 0.4 MG CAPS capsule Take 0.4 mg by mouth  daily after supper.        No current facility-administered medications for this visit.    Physical Exam: Filed Vitals:   07/16/13 0948  BP: 118/65  Pulse: 59  Height: 5\' 8"  (1.727 m)  Weight: 168 lb (76.204 kg)    GEN- The patient is elderly appearing, alert and oriented x 3 today.   Head- normocephalic, atraumatic Eyes-  Sclera clear, conjunctiva pink Ears- hearing intact Oropharynx- clear Lungs- Clear to ausculation bilaterally, normal work of breathing Chest- pacemaker pocket is well healed Heart- Regular rate and rhythm, no murmurs, rubs or gallops, PMI not laterally displaced GI- soft, NT, ND, + BS Extremities- no clubbing, cyanosis, or edema  Pacemaker interrogation- reviewed in detail today,  See PACEART report  Assessment and Plan:  1. Symptomatic Bradycardia/ tachybrady syndrome Normal pacemaker function See Pace Art report No changes today Increase lower pacing rate and add rate response due to fatigue.  2. Paroxysmal afib afib burden 1% by PPM interrogation today Continue eliquis  3. Fatigue Unclear etiology I have spoken with Dr Pleas Koch this am who knows the patient well.  We reviewed cbc, cmet, and tsh values from 2/15 which were all pretty much in range.  The cause for his fatigue is unknown.  I will increasing his lower pacing rate  to 70 bpm today and add rate response.  In addition, I will obtain an echo to evaluate his LV function  Return in 4-6 weeks for follow-up with Lytle Michaels I will see in a year He is instructed to follow-up with primary care later this month as scheduled

## 2013-07-16 NOTE — Patient Instructions (Signed)
   Carelink device check in 1 month. Your physician recommends that you schedule a follow-up appointment in: 1 year with Dr. Rayann Heman. You will receive a reminder letter in the mail in about 10 months reminding you to call and schedule your appointment. If you don't receive this letter, please contact our office. Your physician recommends that you continue on your current medications as directed. Please refer to the Current Medication list given to you today. Your physician has requested that you have an echocardiogram. Echocardiography is a painless test that uses sound waves to create images of your heart. It provides your doctor with information about the size and shape of your heart and how well your heart's chambers and valves are working. This procedure takes approximately one hour. There are no restrictions for this procedure.

## 2013-07-18 DIAGNOSIS — E039 Hypothyroidism, unspecified: Secondary | ICD-10-CM | POA: Diagnosis not present

## 2013-07-18 DIAGNOSIS — M818 Other osteoporosis without current pathological fracture: Secondary | ICD-10-CM | POA: Diagnosis not present

## 2013-07-18 DIAGNOSIS — I4891 Unspecified atrial fibrillation: Secondary | ICD-10-CM | POA: Diagnosis not present

## 2013-07-25 ENCOUNTER — Other Ambulatory Visit: Payer: Self-pay

## 2013-07-25 ENCOUNTER — Other Ambulatory Visit (INDEPENDENT_AMBULATORY_CARE_PROVIDER_SITE_OTHER): Payer: MEDICARE

## 2013-07-25 DIAGNOSIS — I4891 Unspecified atrial fibrillation: Secondary | ICD-10-CM

## 2013-07-25 DIAGNOSIS — I359 Nonrheumatic aortic valve disorder, unspecified: Secondary | ICD-10-CM | POA: Diagnosis not present

## 2013-07-25 DIAGNOSIS — I495 Sick sinus syndrome: Secondary | ICD-10-CM

## 2013-07-25 DIAGNOSIS — I498 Other specified cardiac arrhythmias: Secondary | ICD-10-CM

## 2013-07-25 DIAGNOSIS — R5383 Other fatigue: Secondary | ICD-10-CM

## 2013-07-25 DIAGNOSIS — I059 Rheumatic mitral valve disease, unspecified: Secondary | ICD-10-CM

## 2013-07-25 DIAGNOSIS — R5381 Other malaise: Secondary | ICD-10-CM

## 2013-07-26 DIAGNOSIS — I495 Sick sinus syndrome: Secondary | ICD-10-CM | POA: Diagnosis not present

## 2013-07-26 DIAGNOSIS — F3289 Other specified depressive episodes: Secondary | ICD-10-CM | POA: Diagnosis not present

## 2013-07-26 DIAGNOSIS — R351 Nocturia: Secondary | ICD-10-CM | POA: Diagnosis not present

## 2013-07-26 DIAGNOSIS — D649 Anemia, unspecified: Secondary | ICD-10-CM | POA: Diagnosis not present

## 2013-07-26 DIAGNOSIS — E039 Hypothyroidism, unspecified: Secondary | ICD-10-CM | POA: Diagnosis not present

## 2013-07-26 DIAGNOSIS — H3530 Unspecified macular degeneration: Secondary | ICD-10-CM | POA: Diagnosis not present

## 2013-07-26 DIAGNOSIS — I4891 Unspecified atrial fibrillation: Secondary | ICD-10-CM | POA: Diagnosis not present

## 2013-07-26 DIAGNOSIS — Z23 Encounter for immunization: Secondary | ICD-10-CM | POA: Diagnosis not present

## 2013-07-26 DIAGNOSIS — F329 Major depressive disorder, single episode, unspecified: Secondary | ICD-10-CM | POA: Diagnosis not present

## 2013-07-31 ENCOUNTER — Telehealth: Payer: Self-pay | Admitting: *Deleted

## 2013-07-31 NOTE — Telephone Encounter (Signed)
Patient informed. 

## 2013-08-19 DIAGNOSIS — F17200 Nicotine dependence, unspecified, uncomplicated: Secondary | ICD-10-CM | POA: Diagnosis not present

## 2013-08-19 DIAGNOSIS — R1031 Right lower quadrant pain: Secondary | ICD-10-CM | POA: Diagnosis not present

## 2013-08-19 DIAGNOSIS — I517 Cardiomegaly: Secondary | ICD-10-CM | POA: Diagnosis not present

## 2013-08-19 DIAGNOSIS — K5730 Diverticulosis of large intestine without perforation or abscess without bleeding: Secondary | ICD-10-CM | POA: Diagnosis not present

## 2013-08-19 DIAGNOSIS — K5732 Diverticulitis of large intestine without perforation or abscess without bleeding: Secondary | ICD-10-CM | POA: Diagnosis not present

## 2013-08-21 DIAGNOSIS — I4891 Unspecified atrial fibrillation: Secondary | ICD-10-CM | POA: Diagnosis not present

## 2013-08-21 DIAGNOSIS — K219 Gastro-esophageal reflux disease without esophagitis: Secondary | ICD-10-CM | POA: Diagnosis not present

## 2013-08-21 DIAGNOSIS — I495 Sick sinus syndrome: Secondary | ICD-10-CM | POA: Diagnosis not present

## 2013-08-21 DIAGNOSIS — F3289 Other specified depressive episodes: Secondary | ICD-10-CM | POA: Diagnosis not present

## 2013-08-21 DIAGNOSIS — K5730 Diverticulosis of large intestine without perforation or abscess without bleeding: Secondary | ICD-10-CM | POA: Diagnosis not present

## 2013-08-21 DIAGNOSIS — E039 Hypothyroidism, unspecified: Secondary | ICD-10-CM | POA: Diagnosis not present

## 2013-08-21 DIAGNOSIS — K5732 Diverticulitis of large intestine without perforation or abscess without bleeding: Secondary | ICD-10-CM | POA: Diagnosis not present

## 2013-08-21 DIAGNOSIS — F329 Major depressive disorder, single episode, unspecified: Secondary | ICD-10-CM | POA: Diagnosis not present

## 2013-08-21 DIAGNOSIS — K4091 Unilateral inguinal hernia, without obstruction or gangrene, recurrent: Secondary | ICD-10-CM | POA: Diagnosis not present

## 2013-08-24 ENCOUNTER — Ambulatory Visit (INDEPENDENT_AMBULATORY_CARE_PROVIDER_SITE_OTHER): Payer: MEDICARE | Admitting: *Deleted

## 2013-08-24 DIAGNOSIS — I4891 Unspecified atrial fibrillation: Secondary | ICD-10-CM

## 2013-08-24 DIAGNOSIS — I495 Sick sinus syndrome: Secondary | ICD-10-CM

## 2013-08-24 LAB — MDC_IDC_ENUM_SESS_TYPE_INCLINIC
Battery Remaining Longevity: 143 mo
Brady Statistic AP VP Percent: 0 %
Brady Statistic AS VS Percent: 8 %
Lead Channel Impedance Value: 685 Ohm
Lead Channel Pacing Threshold Amplitude: 0.5 V
Lead Channel Pacing Threshold Pulse Width: 0.46 ms
Lead Channel Sensing Intrinsic Amplitude: 11.2 mV
Lead Channel Setting Pacing Amplitude: 2 V
Lead Channel Setting Pacing Pulse Width: 0.46 ms
Lead Channel Setting Sensing Sensitivity: 4 mV
MDC IDC MSMT BATTERY IMPEDANCE: 108 Ohm
MDC IDC MSMT BATTERY VOLTAGE: 2.8 V
MDC IDC MSMT LEADCHNL RA IMPEDANCE VALUE: 619 Ohm
MDC IDC MSMT LEADCHNL RA PACING THRESHOLD AMPLITUDE: 0.75 V
MDC IDC MSMT LEADCHNL RA PACING THRESHOLD PULSEWIDTH: 0.4 ms
MDC IDC MSMT LEADCHNL RA SENSING INTR AMPL: 0.7 mV
MDC IDC SESS DTM: 20150612110836
MDC IDC SET LEADCHNL RV PACING AMPLITUDE: 2.5 V
MDC IDC STAT BRADY AP VS PERCENT: 91 %
MDC IDC STAT BRADY AS VP PERCENT: 0 %

## 2013-08-24 NOTE — Progress Notes (Signed)
Pacemaker check in clinic. Battery longevity 12 years. Normal device function. Pt in AF 2.6% of time. + Eliquis. Longest episode was 8 hours 23 minutes. 4 ventricular high rate episodes--longest was 3 minutes 2 seconds. Pt had spell with diverticulitis that has caused weakness. Before spell, pt was feeling well (changes made on 07-16-13). ROV in 6 mths w/device clinic and in May with [facility].

## 2013-09-17 DIAGNOSIS — L989 Disorder of the skin and subcutaneous tissue, unspecified: Secondary | ICD-10-CM | POA: Diagnosis not present

## 2013-09-19 ENCOUNTER — Encounter: Payer: Self-pay | Admitting: Internal Medicine

## 2013-09-20 DIAGNOSIS — D485 Neoplasm of uncertain behavior of skin: Secondary | ICD-10-CM | POA: Diagnosis not present

## 2013-09-20 DIAGNOSIS — L819 Disorder of pigmentation, unspecified: Secondary | ICD-10-CM | POA: Diagnosis not present

## 2013-09-20 DIAGNOSIS — L570 Actinic keratosis: Secondary | ICD-10-CM | POA: Diagnosis not present

## 2013-09-24 ENCOUNTER — Other Ambulatory Visit: Payer: Self-pay | Admitting: Internal Medicine

## 2013-09-25 DIAGNOSIS — L851 Acquired keratosis [keratoderma] palmaris et plantaris: Secondary | ICD-10-CM | POA: Diagnosis not present

## 2013-09-25 DIAGNOSIS — I739 Peripheral vascular disease, unspecified: Secondary | ICD-10-CM | POA: Diagnosis not present

## 2013-09-25 DIAGNOSIS — L609 Nail disorder, unspecified: Secondary | ICD-10-CM | POA: Diagnosis not present

## 2013-10-09 DIAGNOSIS — D649 Anemia, unspecified: Secondary | ICD-10-CM | POA: Diagnosis not present

## 2013-10-09 DIAGNOSIS — E039 Hypothyroidism, unspecified: Secondary | ICD-10-CM | POA: Diagnosis not present

## 2013-10-09 DIAGNOSIS — K219 Gastro-esophageal reflux disease without esophagitis: Secondary | ICD-10-CM | POA: Diagnosis not present

## 2013-10-15 DIAGNOSIS — H3530 Unspecified macular degeneration: Secondary | ICD-10-CM | POA: Diagnosis not present

## 2013-10-15 DIAGNOSIS — F3289 Other specified depressive episodes: Secondary | ICD-10-CM | POA: Diagnosis not present

## 2013-10-15 DIAGNOSIS — N183 Chronic kidney disease, stage 3 unspecified: Secondary | ICD-10-CM | POA: Diagnosis not present

## 2013-10-15 DIAGNOSIS — N400 Enlarged prostate without lower urinary tract symptoms: Secondary | ICD-10-CM | POA: Diagnosis not present

## 2013-10-15 DIAGNOSIS — D649 Anemia, unspecified: Secondary | ICD-10-CM | POA: Diagnosis not present

## 2013-10-15 DIAGNOSIS — K5900 Constipation, unspecified: Secondary | ICD-10-CM | POA: Diagnosis not present

## 2013-10-15 DIAGNOSIS — E039 Hypothyroidism, unspecified: Secondary | ICD-10-CM | POA: Diagnosis not present

## 2013-10-15 DIAGNOSIS — R300 Dysuria: Secondary | ICD-10-CM | POA: Diagnosis not present

## 2013-10-15 DIAGNOSIS — F329 Major depressive disorder, single episode, unspecified: Secondary | ICD-10-CM | POA: Diagnosis not present

## 2013-11-28 DIAGNOSIS — Z23 Encounter for immunization: Secondary | ICD-10-CM | POA: Diagnosis not present

## 2013-12-04 DIAGNOSIS — D2330 Other benign neoplasm of skin of unspecified part of face: Secondary | ICD-10-CM | POA: Diagnosis not present

## 2013-12-04 DIAGNOSIS — L851 Acquired keratosis [keratoderma] palmaris et plantaris: Secondary | ICD-10-CM | POA: Diagnosis not present

## 2013-12-04 DIAGNOSIS — D485 Neoplasm of uncertain behavior of skin: Secondary | ICD-10-CM | POA: Diagnosis not present

## 2013-12-04 DIAGNOSIS — L570 Actinic keratosis: Secondary | ICD-10-CM | POA: Diagnosis not present

## 2013-12-04 DIAGNOSIS — L609 Nail disorder, unspecified: Secondary | ICD-10-CM | POA: Diagnosis not present

## 2013-12-04 DIAGNOSIS — I739 Peripheral vascular disease, unspecified: Secondary | ICD-10-CM | POA: Diagnosis not present

## 2013-12-04 DIAGNOSIS — L821 Other seborrheic keratosis: Secondary | ICD-10-CM | POA: Diagnosis not present

## 2014-01-15 DIAGNOSIS — D649 Anemia, unspecified: Secondary | ICD-10-CM | POA: Diagnosis not present

## 2014-01-15 DIAGNOSIS — E039 Hypothyroidism, unspecified: Secondary | ICD-10-CM | POA: Diagnosis not present

## 2014-01-15 DIAGNOSIS — K219 Gastro-esophageal reflux disease without esophagitis: Secondary | ICD-10-CM | POA: Diagnosis not present

## 2014-01-15 DIAGNOSIS — I482 Chronic atrial fibrillation: Secondary | ICD-10-CM | POA: Diagnosis not present

## 2014-01-15 DIAGNOSIS — N411 Chronic prostatitis: Secondary | ICD-10-CM | POA: Diagnosis not present

## 2014-01-15 DIAGNOSIS — N183 Chronic kidney disease, stage 3 (moderate): Secondary | ICD-10-CM | POA: Diagnosis not present

## 2014-01-22 DIAGNOSIS — F0631 Mood disorder due to known physiological condition with depressive features: Secondary | ICD-10-CM | POA: Diagnosis not present

## 2014-01-22 DIAGNOSIS — I495 Sick sinus syndrome: Secondary | ICD-10-CM | POA: Diagnosis not present

## 2014-01-22 DIAGNOSIS — K219 Gastro-esophageal reflux disease without esophagitis: Secondary | ICD-10-CM | POA: Diagnosis not present

## 2014-01-22 DIAGNOSIS — K5730 Diverticulosis of large intestine without perforation or abscess without bleeding: Secondary | ICD-10-CM | POA: Diagnosis not present

## 2014-01-22 DIAGNOSIS — I482 Chronic atrial fibrillation: Secondary | ICD-10-CM | POA: Diagnosis not present

## 2014-01-22 DIAGNOSIS — N183 Chronic kidney disease, stage 3 (moderate): Secondary | ICD-10-CM | POA: Diagnosis not present

## 2014-01-22 DIAGNOSIS — E039 Hypothyroidism, unspecified: Secondary | ICD-10-CM | POA: Diagnosis not present

## 2014-01-22 DIAGNOSIS — D649 Anemia, unspecified: Secondary | ICD-10-CM | POA: Diagnosis not present

## 2014-02-11 DIAGNOSIS — F0631 Mood disorder due to known physiological condition with depressive features: Secondary | ICD-10-CM | POA: Diagnosis not present

## 2014-02-11 DIAGNOSIS — F419 Anxiety disorder, unspecified: Secondary | ICD-10-CM | POA: Diagnosis not present

## 2014-02-14 DIAGNOSIS — Z79899 Other long term (current) drug therapy: Secondary | ICD-10-CM | POA: Diagnosis not present

## 2014-02-14 DIAGNOSIS — E039 Hypothyroidism, unspecified: Secondary | ICD-10-CM | POA: Diagnosis not present

## 2014-02-14 DIAGNOSIS — Z87891 Personal history of nicotine dependence: Secondary | ICD-10-CM | POA: Diagnosis not present

## 2014-02-14 DIAGNOSIS — E860 Dehydration: Secondary | ICD-10-CM | POA: Diagnosis not present

## 2014-02-14 DIAGNOSIS — R531 Weakness: Secondary | ICD-10-CM | POA: Diagnosis not present

## 2014-02-14 DIAGNOSIS — Z950 Presence of cardiac pacemaker: Secondary | ICD-10-CM | POA: Diagnosis not present

## 2014-02-21 ENCOUNTER — Encounter (HOSPITAL_COMMUNITY): Payer: Self-pay | Admitting: Internal Medicine

## 2014-02-28 ENCOUNTER — Ambulatory Visit (INDEPENDENT_AMBULATORY_CARE_PROVIDER_SITE_OTHER): Payer: MEDICARE | Admitting: *Deleted

## 2014-02-28 DIAGNOSIS — I480 Paroxysmal atrial fibrillation: Secondary | ICD-10-CM

## 2014-02-28 DIAGNOSIS — I495 Sick sinus syndrome: Secondary | ICD-10-CM

## 2014-02-28 LAB — MDC_IDC_ENUM_SESS_TYPE_INCLINIC
Battery Remaining Longevity: 142 mo
Battery Voltage: 2.8 V
Brady Statistic AP VS Percent: 90 %
Brady Statistic AS VP Percent: 0 %
Brady Statistic AS VS Percent: 9 %
Date Time Interrogation Session: 20151217171109
Lead Channel Impedance Value: 623 Ohm
Lead Channel Pacing Threshold Amplitude: 0.5 V
Lead Channel Pacing Threshold Amplitude: 0.75 V
Lead Channel Pacing Threshold Pulse Width: 0.4 ms
Lead Channel Pacing Threshold Pulse Width: 0.4 ms
Lead Channel Setting Pacing Amplitude: 2.5 V
Lead Channel Setting Sensing Sensitivity: 4 mV
MDC IDC MSMT BATTERY IMPEDANCE: 108 Ohm
MDC IDC MSMT LEADCHNL RA IMPEDANCE VALUE: 590 Ohm
MDC IDC MSMT LEADCHNL RA SENSING INTR AMPL: 0.7 mV
MDC IDC MSMT LEADCHNL RV SENSING INTR AMPL: 11.2 mV
MDC IDC SET LEADCHNL RA PACING AMPLITUDE: 2 V
MDC IDC SET LEADCHNL RV PACING PULSEWIDTH: 0.4 ms
MDC IDC STAT BRADY AP VP PERCENT: 0 %

## 2014-02-28 NOTE — Progress Notes (Signed)
Pacemaker check in clinic. Battery longevity 12 years. Pt in AF 2.5% of time. + Eliquis. 6 ventricular high rate episodes--longest was 1 minute 13 seconds--AF w/RVR. No changes made. ROV 07-19-14 @ 1000 with [facility].

## 2014-03-04 DIAGNOSIS — I739 Peripheral vascular disease, unspecified: Secondary | ICD-10-CM | POA: Diagnosis not present

## 2014-03-04 DIAGNOSIS — L609 Nail disorder, unspecified: Secondary | ICD-10-CM | POA: Diagnosis not present

## 2014-03-04 DIAGNOSIS — L110 Acquired keratosis follicularis: Secondary | ICD-10-CM | POA: Diagnosis not present

## 2014-03-11 ENCOUNTER — Encounter: Payer: Self-pay | Admitting: Internal Medicine

## 2014-03-21 ENCOUNTER — Encounter: Payer: Self-pay | Admitting: Cardiology

## 2014-03-21 DIAGNOSIS — K5792 Diverticulitis of intestine, part unspecified, without perforation or abscess without bleeding: Secondary | ICD-10-CM | POA: Diagnosis not present

## 2014-03-21 DIAGNOSIS — I480 Paroxysmal atrial fibrillation: Secondary | ICD-10-CM | POA: Diagnosis not present

## 2014-03-21 DIAGNOSIS — N400 Enlarged prostate without lower urinary tract symptoms: Secondary | ICD-10-CM | POA: Diagnosis not present

## 2014-03-21 DIAGNOSIS — Z950 Presence of cardiac pacemaker: Secondary | ICD-10-CM | POA: Diagnosis not present

## 2014-03-21 DIAGNOSIS — K91840 Postprocedural hemorrhage and hematoma of a digestive system organ or structure following a digestive system procedure: Secondary | ICD-10-CM | POA: Diagnosis not present

## 2014-03-21 DIAGNOSIS — Z79899 Other long term (current) drug therapy: Secondary | ICD-10-CM | POA: Diagnosis not present

## 2014-03-21 DIAGNOSIS — F0390 Unspecified dementia without behavioral disturbance: Secondary | ICD-10-CM | POA: Diagnosis not present

## 2014-03-21 DIAGNOSIS — E039 Hypothyroidism, unspecified: Secondary | ICD-10-CM | POA: Diagnosis not present

## 2014-03-21 DIAGNOSIS — I495 Sick sinus syndrome: Secondary | ICD-10-CM | POA: Diagnosis not present

## 2014-03-21 DIAGNOSIS — R918 Other nonspecific abnormal finding of lung field: Secondary | ICD-10-CM | POA: Diagnosis not present

## 2014-03-21 DIAGNOSIS — Z7901 Long term (current) use of anticoagulants: Secondary | ICD-10-CM | POA: Diagnosis not present

## 2014-03-21 DIAGNOSIS — I2510 Atherosclerotic heart disease of native coronary artery without angina pectoris: Secondary | ICD-10-CM | POA: Diagnosis not present

## 2014-03-21 DIAGNOSIS — J329 Chronic sinusitis, unspecified: Secondary | ICD-10-CM | POA: Diagnosis not present

## 2014-03-21 DIAGNOSIS — K219 Gastro-esophageal reflux disease without esophagitis: Secondary | ICD-10-CM | POA: Diagnosis not present

## 2014-03-21 DIAGNOSIS — G459 Transient cerebral ischemic attack, unspecified: Secondary | ICD-10-CM | POA: Diagnosis not present

## 2014-03-21 DIAGNOSIS — F329 Major depressive disorder, single episode, unspecified: Secondary | ICD-10-CM | POA: Diagnosis not present

## 2014-03-22 ENCOUNTER — Encounter: Payer: Self-pay | Admitting: Cardiology

## 2014-03-28 ENCOUNTER — Ambulatory Visit (HOSPITAL_COMMUNITY): Payer: MEDICARE | Admitting: Psychology

## 2014-04-02 DIAGNOSIS — F0631 Mood disorder due to known physiological condition with depressive features: Secondary | ICD-10-CM | POA: Diagnosis not present

## 2014-04-02 DIAGNOSIS — F419 Anxiety disorder, unspecified: Secondary | ICD-10-CM | POA: Diagnosis not present

## 2014-04-02 DIAGNOSIS — Z1389 Encounter for screening for other disorder: Secondary | ICD-10-CM | POA: Diagnosis not present

## 2014-04-02 DIAGNOSIS — Z9889 Other specified postprocedural states: Secondary | ICD-10-CM | POA: Diagnosis not present

## 2014-04-02 DIAGNOSIS — D649 Anemia, unspecified: Secondary | ICD-10-CM | POA: Diagnosis not present

## 2014-04-09 DIAGNOSIS — D649 Anemia, unspecified: Secondary | ICD-10-CM | POA: Diagnosis not present

## 2014-04-09 DIAGNOSIS — K219 Gastro-esophageal reflux disease without esophagitis: Secondary | ICD-10-CM | POA: Diagnosis not present

## 2014-04-09 DIAGNOSIS — E039 Hypothyroidism, unspecified: Secondary | ICD-10-CM | POA: Diagnosis not present

## 2014-04-09 DIAGNOSIS — N183 Chronic kidney disease, stage 3 (moderate): Secondary | ICD-10-CM | POA: Diagnosis not present

## 2014-04-09 DIAGNOSIS — I482 Chronic atrial fibrillation: Secondary | ICD-10-CM | POA: Diagnosis not present

## 2014-04-16 ENCOUNTER — Telehealth: Payer: Self-pay | Admitting: *Deleted

## 2014-04-16 DIAGNOSIS — I495 Sick sinus syndrome: Secondary | ICD-10-CM | POA: Diagnosis not present

## 2014-04-16 DIAGNOSIS — N183 Chronic kidney disease, stage 3 (moderate): Secondary | ICD-10-CM | POA: Diagnosis not present

## 2014-04-16 DIAGNOSIS — I482 Chronic atrial fibrillation: Secondary | ICD-10-CM | POA: Diagnosis not present

## 2014-04-16 DIAGNOSIS — D649 Anemia, unspecified: Secondary | ICD-10-CM | POA: Diagnosis not present

## 2014-04-16 DIAGNOSIS — E871 Hypo-osmolality and hyponatremia: Secondary | ICD-10-CM | POA: Diagnosis not present

## 2014-04-16 NOTE — Telephone Encounter (Addendum)
Dr. Pleas Koch called office asking to speak to Dr. Ron Parker today - regarding patient resuming Eliquis management after severe bleed due to extraction.  After review of chart, it does not appear that Dr. Ron Parker has ever seen patient in the office.  Last seen by routine cardiology (Dr. Dannielle Burn) in 2012 & Dr. Rayann Heman in May 2015.  Phone call placed to patient to offer appointment for evaluation in office due to length of time since last being seen in office.  Spoke with wife & OV scheduled for tomorrow at 2:40 with Dr. Harl Bowie.  Dr. Harl Bowie aware & okayed patient to be seen in office.  Most recent notes requested & received from Dr. Lizbeth Bark office.  Also, reminded patient to bring all medications & insurance cards with him to the visit.

## 2014-04-17 ENCOUNTER — Encounter: Payer: Self-pay | Admitting: Cardiology

## 2014-04-17 ENCOUNTER — Ambulatory Visit (INDEPENDENT_AMBULATORY_CARE_PROVIDER_SITE_OTHER): Payer: MEDICARE | Admitting: Cardiology

## 2014-04-17 VITALS — BP 101/62 | HR 71 | Ht 68.0 in | Wt 164.0 lb

## 2014-04-17 DIAGNOSIS — I495 Sick sinus syndrome: Secondary | ICD-10-CM

## 2014-04-17 DIAGNOSIS — I4891 Unspecified atrial fibrillation: Secondary | ICD-10-CM

## 2014-04-17 MED ORDER — APIXABAN 5 MG PO TABS: 5.0000 mg | ORAL_TABLET | Freq: Two times a day (BID) | ORAL | Status: DC

## 2014-04-17 NOTE — Patient Instructions (Signed)
Your physician wants you to follow-up in: 1 year with Dr. Bryna Colander will receive a reminder letter in the mail two months in advance. If you don't receive a letter, please call our office to schedule the follow-up appointment.  Your physician has recommended you make the following change in your medication:   RESTART ELIQUIS 5 MG TWICE DAILY  CONTINUE ALL OTHER MEDICATIONS AS DIRECTED  Thank you for choosing [facility]!!

## 2014-04-17 NOTE — Progress Notes (Signed)
Clinical Summary Wesley Dyer is a 81 y.o.male seen today for follow up of the following medical problems, he was last seen by Dr Rayann Heman, this is our first visit together.  1. Afib - denies any palpitations, compliant with meds - early January patient with recent upper molar removal, developed severe bleeding and presented to [facility] ER. Area packed, monitored in hospital and discharged - Hgb 9.9 on admission, decreased to 8.8 at discharge. Cr 1.22 - from notes does not appear patient made dentist aware he was on anticoagulation.  - from notes hx of severe epistaxis on coumadin but has tolerated eliquis    2. Tachy-brady syndrome - pacemaker followed by Dr Rayann Heman. Last device check 02/2014 normal functon. Afib 2.5%, 6 high reate episodes longest 1 min 13 sec.     Past Medical History  Diagnosis Date  . Paroxysmal atrial fibrillation May 2011  . Chest pain     unspecified  . Gastroesophageal reflux disease   . Hypothyroidism   . Depression   . Transient ischemic attack     high risk for thromboembolic disease, secondary to atrial fibrillation, patient declines Coumadin due to prior history of severe epistaxis. Patient not willing to restart Coumadin therapy  . Drug therapy     Coumadin Therapy  . Chest pain, atypical   . Gastroesophageal reflux disease   . Depression   . Tachycardia-bradycardia syndrome     7 second pauses documented     Allergies  Allergen Reactions  . Warfarin Sodium     REACTION: bleeding     Current Outpatient Prescriptions  Medication Sig Dispense Refill  . ELIQUIS 5 MG TABS tablet take 1 tablet by mouth twice a day 60 tablet 6  . levothyroxine (SYNTHROID, LEVOTHROID) 25 MCG tablet Take 25 mcg by mouth daily.      . Multiple Vitamins-Minerals (OCUVITE PRESERVISION) TABS Take 1 tablet by mouth 2 (two) times daily.      . naproxen sodium (ANAPROX) 220 MG tablet Take 220 mg by mouth as needed.    Marland Kitchen omeprazole (PRILOSEC) 20 MG capsule Take 20  mg by mouth daily.     . simvastatin (ZOCOR) 20 MG tablet Take 20 mg by mouth at bedtime.      . tamsulosin (FLOMAX) 0.4 MG CAPS capsule Take 0.4 mg by mouth daily after supper.      No current facility-administered medications for this visit.     Past Surgical History  Procedure Laterality Date  . Abdominal surgery      Abdominal diverticultis  . Pacemaker insertion  07/06/12    MDT Adapta L implanted by Dr Caryl Comes  . Permanent pacemaker insertion N/A 07/06/2012    Procedure: PERMANENT PACEMAKER INSERTION;  Surgeon: Deboraha Sprang, MD;  Location: [facility] CATH LAB;  Service: Cardiovascular;  Laterality: N/A;     Allergies  Allergen Reactions  . Warfarin Sodium     REACTION: bleeding      Family History  Problem Relation Age of Onset  . Coronary artery disease    . Hyperlipidemia       Social History Wesley Dyer reports that he has quit smoking. His smoking use included Cigarettes. He has a 5 pack-year smoking history. He quit smokeless tobacco use about 9 years ago. His smokeless tobacco use included Chew. Wesley Dyer reports that he does not drink alcohol.   Review of Systems CONSTITUTIONAL: No weight loss, fever, chills, weakness or fatigue.  HEENT: Eyes: No visual loss, blurred vision,  double vision or yellow sclerae.No hearing loss, sneezing, congestion, runny nose or sore throat.  SKIN: No rash or itching.  CARDIOVASCULAR: per HPI RESPIRATORY: No shortness of breath, cough or sputum.  GASTROINTESTINAL: No anorexia, nausea, vomiting or diarrhea. No abdominal pain or blood.  GENITOURINARY: No burning on urination, no polyuria NEUROLOGICAL: No headache, dizziness, syncope, paralysis, ataxia, numbness or tingling in the extremities. No change in bowel or bladder control.  MUSCULOSKELETAL: No muscle, back pain, joint pain or stiffness.  LYMPHATICS: No enlarged nodes. No history of splenectomy.  PSYCHIATRIC: No history of depression or anxiety.  ENDOCRINOLOGIC: No reports of  sweating, cold or heat intolerance. No polyuria or polydipsia.  Marland Kitchen   Physical Examination p 71 bp 101/62 Wt 164 lbs BMI 25 Gen: resting comfortably, no acute distress HEENT: no scleral icterus, pupils equal round and reactive, no palptable cervical adenopathy,  CV: irreg, no m/r/g, no JVD Resp: Clear to auscultation bilaterally GI: abdomen is soft, non-tender, non-distended, normal bowel sounds, no hepatosplenomegaly MSK: extremities are warm, no edema.  Skin: warm, no rash Neuro:  no focal deficits Psych: appropriate affect   Diagnostic Studies 07/2013 Echo Study Conclusions  - Left ventricle: The cavity size was normal. Wall thickness was increased in a pattern of mild LVH. Systolic function was normal. The estimated ejection fraction was in the range of 55% to 60%. Wall motion was normal; there were no regional wall motion abnormalities. Doppler parameters are consistent with abnormal left ventricular relaxation (grade 1 diastolic dysfunction). - Aortic valve: Trileaflet; mildly thickened, mildly calcified leaflets. Trivial regurgitation. - Mitral valve: Calcified annulus. Mildly thickened leaflets . Trivial regurgitation. - Left atrium: The atrium was at the upper limits of normal in size. - Right ventricle: The cavity size was mildly dilated. Pacer wire or catheter noted in right ventricle. - Right atrium: Central venous pressure: 21mm Hg (est). - Tricuspid valve: Mild regurgitation. - Pulmonary arteries: PA peak pressure: 22mm Hg (S). - Pericardium, extracardiac: There was no pericardial effusion. Impressions:  - Mild LVH with LVEF 75-10%, grade 1 diastolic dysfunction. Upper normal left atrial chamber size. MAC with trivial mitral regurgitation. Mildly sclerotic aortic valve with trivial aortic regurgitation. Mild RV enlargement, device wire noted. Mild tricuspid regurgitation with PASP 30 mm mercury.    Assessment and Plan    1. Afib - no current symptoms - recent heavy mouth bleed after oral surgery when he failed to stop taking his eliquis. Educated extensively about risks of anticoagulation and procedures, and asked to contact our office before any future procedures. Bleeding has resolved, will restart eliquis 5mg  bid  2. Tachy-brady syndrome - no current symptoms, normal device check 02/2014. Continue to monitor    F/u 1 year     Arnoldo Lenis, M.D.

## 2014-04-18 ENCOUNTER — Ambulatory Visit (HOSPITAL_COMMUNITY): Payer: MEDICARE | Admitting: Psychiatry

## 2014-04-23 DIAGNOSIS — E871 Hypo-osmolality and hyponatremia: Secondary | ICD-10-CM | POA: Diagnosis not present

## 2014-04-23 DIAGNOSIS — N183 Chronic kidney disease, stage 3 (moderate): Secondary | ICD-10-CM | POA: Diagnosis not present

## 2014-05-13 DIAGNOSIS — L110 Acquired keratosis follicularis: Secondary | ICD-10-CM | POA: Diagnosis not present

## 2014-05-13 DIAGNOSIS — L609 Nail disorder, unspecified: Secondary | ICD-10-CM | POA: Diagnosis not present

## 2014-05-13 DIAGNOSIS — I739 Peripheral vascular disease, unspecified: Secondary | ICD-10-CM | POA: Diagnosis not present

## 2014-05-23 DIAGNOSIS — I495 Sick sinus syndrome: Secondary | ICD-10-CM | POA: Diagnosis not present

## 2014-05-23 DIAGNOSIS — D649 Anemia, unspecified: Secondary | ICD-10-CM | POA: Diagnosis not present

## 2014-05-23 DIAGNOSIS — F419 Anxiety disorder, unspecified: Secondary | ICD-10-CM | POA: Diagnosis not present

## 2014-05-23 DIAGNOSIS — E039 Hypothyroidism, unspecified: Secondary | ICD-10-CM | POA: Diagnosis not present

## 2014-05-23 DIAGNOSIS — R5383 Other fatigue: Secondary | ICD-10-CM | POA: Diagnosis not present

## 2014-05-23 DIAGNOSIS — F0631 Mood disorder due to known physiological condition with depressive features: Secondary | ICD-10-CM | POA: Diagnosis not present

## 2014-05-23 DIAGNOSIS — N183 Chronic kidney disease, stage 3 (moderate): Secondary | ICD-10-CM | POA: Diagnosis not present

## 2014-06-05 DIAGNOSIS — H16142 Punctate keratitis, left eye: Secondary | ICD-10-CM | POA: Diagnosis not present

## 2014-06-20 DIAGNOSIS — N183 Chronic kidney disease, stage 3 (moderate): Secondary | ICD-10-CM | POA: Diagnosis not present

## 2014-06-20 DIAGNOSIS — D649 Anemia, unspecified: Secondary | ICD-10-CM | POA: Diagnosis not present

## 2014-06-20 DIAGNOSIS — R5383 Other fatigue: Secondary | ICD-10-CM | POA: Diagnosis not present

## 2014-07-16 DIAGNOSIS — K219 Gastro-esophageal reflux disease without esophagitis: Secondary | ICD-10-CM | POA: Diagnosis not present

## 2014-07-16 DIAGNOSIS — E039 Hypothyroidism, unspecified: Secondary | ICD-10-CM | POA: Diagnosis not present

## 2014-07-16 DIAGNOSIS — N183 Chronic kidney disease, stage 3 (moderate): Secondary | ICD-10-CM | POA: Diagnosis not present

## 2014-07-19 ENCOUNTER — Encounter: Payer: MEDICARE | Admitting: Internal Medicine

## 2014-07-22 DIAGNOSIS — L110 Acquired keratosis follicularis: Secondary | ICD-10-CM | POA: Diagnosis not present

## 2014-07-22 DIAGNOSIS — L609 Nail disorder, unspecified: Secondary | ICD-10-CM | POA: Diagnosis not present

## 2014-07-22 DIAGNOSIS — I739 Peripheral vascular disease, unspecified: Secondary | ICD-10-CM | POA: Diagnosis not present

## 2014-07-23 DIAGNOSIS — K5900 Constipation, unspecified: Secondary | ICD-10-CM | POA: Diagnosis not present

## 2014-07-23 DIAGNOSIS — I482 Chronic atrial fibrillation: Secondary | ICD-10-CM | POA: Diagnosis not present

## 2014-07-23 DIAGNOSIS — E871 Hypo-osmolality and hyponatremia: Secondary | ICD-10-CM | POA: Diagnosis not present

## 2014-07-23 DIAGNOSIS — F0631 Mood disorder due to known physiological condition with depressive features: Secondary | ICD-10-CM | POA: Diagnosis not present

## 2014-07-23 DIAGNOSIS — F419 Anxiety disorder, unspecified: Secondary | ICD-10-CM | POA: Diagnosis not present

## 2014-07-23 DIAGNOSIS — K219 Gastro-esophageal reflux disease without esophagitis: Secondary | ICD-10-CM | POA: Diagnosis not present

## 2014-07-23 DIAGNOSIS — D649 Anemia, unspecified: Secondary | ICD-10-CM | POA: Diagnosis not present

## 2014-07-23 DIAGNOSIS — K222 Esophageal obstruction: Secondary | ICD-10-CM | POA: Diagnosis not present

## 2014-07-23 DIAGNOSIS — R5383 Other fatigue: Secondary | ICD-10-CM | POA: Diagnosis not present

## 2014-07-23 DIAGNOSIS — E039 Hypothyroidism, unspecified: Secondary | ICD-10-CM | POA: Diagnosis not present

## 2014-07-23 DIAGNOSIS — N183 Chronic kidney disease, stage 3 (moderate): Secondary | ICD-10-CM | POA: Diagnosis not present

## 2014-07-23 DIAGNOSIS — K5730 Diverticulosis of large intestine without perforation or abscess without bleeding: Secondary | ICD-10-CM | POA: Diagnosis not present

## 2014-07-26 ENCOUNTER — Encounter: Payer: Self-pay | Admitting: Internal Medicine

## 2014-07-26 ENCOUNTER — Ambulatory Visit (INDEPENDENT_AMBULATORY_CARE_PROVIDER_SITE_OTHER): Payer: MEDICARE | Admitting: Internal Medicine

## 2014-07-26 VITALS — BP 108/64 | HR 80 | Ht 68.0 in | Wt 172.0 lb

## 2014-07-26 DIAGNOSIS — I480 Paroxysmal atrial fibrillation: Secondary | ICD-10-CM | POA: Diagnosis not present

## 2014-07-26 DIAGNOSIS — I495 Sick sinus syndrome: Secondary | ICD-10-CM

## 2014-07-26 LAB — CUP PACEART INCLINIC DEVICE CHECK
Battery Impedance: 131 Ohm
Battery Remaining Longevity: 136 mo
Battery Voltage: 2.8 V
Brady Statistic AP VP Percent: 0 %
Lead Channel Impedance Value: 601 Ohm
Lead Channel Impedance Value: 719 Ohm
Lead Channel Pacing Threshold Amplitude: 0.5 V
Lead Channel Pacing Threshold Pulse Width: 0.4 ms
Lead Channel Sensing Intrinsic Amplitude: 0.7 mV
Lead Channel Setting Pacing Amplitude: 2 V
Lead Channel Setting Pacing Amplitude: 2.5 V
MDC IDC MSMT LEADCHNL RA PACING THRESHOLD AMPLITUDE: 0.5 V
MDC IDC MSMT LEADCHNL RV PACING THRESHOLD PULSEWIDTH: 0.4 ms
MDC IDC MSMT LEADCHNL RV SENSING INTR AMPL: 11.2 mV
MDC IDC SESS DTM: 20160513101119
MDC IDC SET LEADCHNL RV PACING PULSEWIDTH: 0.4 ms
MDC IDC SET LEADCHNL RV SENSING SENSITIVITY: 4 mV
MDC IDC STAT BRADY AP VS PERCENT: 91 %
MDC IDC STAT BRADY AS VP PERCENT: 0 %
MDC IDC STAT BRADY AS VS PERCENT: 9 %

## 2014-07-26 NOTE — Progress Notes (Signed)
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  His primary concern is with his 36 year old granddaughter (Melissa) who has a 6 year old daughter and has difficulty with social impairment.  She is homeless and has Mr Swindler very worried.  Today, he denies symptoms of palpitations, chest pain, shortness of breath,  lower extremity edema, dizziness, presyncope, or syncope.  The patient is otherwise without complaint today.  His fatigue seems stable.  Past Medical History  Diagnosis Date  . Paroxysmal atrial fibrillation May 2011  . Chest pain     unspecified  . Gastroesophageal reflux disease   . Hypothyroidism   . Depression   . Transient ischemic attack     high risk for thromboembolic disease, secondary to atrial fibrillation, patient declines Coumadin due to prior history of severe epistaxis. Patient not willing to restart Coumadin therapy  . Drug therapy     Coumadin Therapy  . Chest pain, atypical   . Gastroesophageal reflux disease   . Depression   . Tachycardia-bradycardia syndrome     7 second pauses documented   Past Surgical History  Procedure Laterality Date  . Abdominal surgery      Abdominal diverticultis  . Pacemaker insertion  07/06/12    MDT Adapta L implanted by Dr Caryl Comes  . Permanent pacemaker insertion N/A 07/06/2012    Procedure: PERMANENT PACEMAKER INSERTION;  Surgeon: Deboraha Sprang, MD;  Location: [facility] CATH LAB;  Service: Cardiovascular;  Laterality: N/A;    Current Outpatient Prescriptions  Medication Sig Dispense Refill  . apixaban (ELIQUIS) 5 MG TABS tablet Take 1 tablet (5 mg total) by mouth 2 (two) times daily. 60 tablet 6  . levothyroxine (SYNTHROID, LEVOTHROID) 25 MCG tablet Take 25 mcg by mouth daily.      . Multiple Vitamins-Minerals (OCUVITE PRESERVISION) TABS Take 1 tablet by mouth 2 (two) times daily.      Marland Kitchen omeprazole (PRILOSEC) 20 MG capsule  Take 20 mg by mouth daily.     . simvastatin (ZOCOR) 20 MG tablet Take 20 mg by mouth at bedtime.      . tamsulosin (FLOMAX) 0.4 MG CAPS capsule Take 0.4 mg by mouth daily after supper.      No current facility-administered medications for this visit.    Physical Exam: Filed Vitals:   07/26/14 0945  BP: 108/64  Pulse: 80  Height: 5\' 8"  (1.727 m)  Weight: 172 lb (78.019 kg)  SpO2: 96%    GEN- The patient is elderly appearing, alert and oriented x 3 today.   Head- normocephalic, atraumatic Eyes-  Sclera clear, conjunctiva pink, poor vision Ears- hearing is decreased Oropharynx- clear Lungs- Clear to ausculation bilaterally, normal work of breathing Chest- pacemaker pocket is well healed Heart- Regular rate and rhythm, no murmurs, rubs or gallops, PMI not laterally displaced GI- soft, NT, ND, + BS Extremities- no clubbing, cyanosis, or edema  Pacemaker interrogation- reviewed in detail today,  See PACEART report  Assessment and Plan:  1. Symptomatic Bradycardia/ tachybrady syndrome Normal pacemaker function See Pace Art report No changes today  2. Paroxysmal afib afib burden 1% by PPM interrogation today Continue eliquis.  PCP is following BMET and CBC .  Would reduce to 2.5mg  BID if creatinine is >1.5  3. Fatigue stable  Carelink , wireX adapter sent Will see Erasmo Downer in 6 months for further instructions on remote monitoring if he is not  transmitting at that time I will see in a year He is instructed to follow-up with primary care as scheduled

## 2014-07-26 NOTE — Patient Instructions (Signed)
Your physician recommends that you continue on your current medications as directed. Please refer to the Current Medication list given to you today. Your physician recommends that you schedule a follow-up appointment in: 6 months in the device clinic with [facility]. Please bring your equipment with you to this visit. You will receive a reminder letter in the mail in about 4 months reminding you to call and schedule your appointment. If you don't receive this letter, please contact our office. Your physician recommends that you schedule a follow-up appointment in: 1 year with Dr. Rayann Heman. You will receive a reminder letter in the mail in about 10 months reminding you to call and schedule your appointment. If you don't receive this letter, please contact our office.

## 2014-08-13 DIAGNOSIS — S92354A Nondisplaced fracture of fifth metatarsal bone, right foot, initial encounter for closed fracture: Secondary | ICD-10-CM | POA: Diagnosis not present

## 2014-08-13 DIAGNOSIS — M79671 Pain in right foot: Secondary | ICD-10-CM | POA: Diagnosis not present

## 2014-08-17 DIAGNOSIS — I4891 Unspecified atrial fibrillation: Secondary | ICD-10-CM | POA: Diagnosis not present

## 2014-08-17 DIAGNOSIS — E871 Hypo-osmolality and hyponatremia: Secondary | ICD-10-CM | POA: Diagnosis not present

## 2014-08-17 DIAGNOSIS — R112 Nausea with vomiting, unspecified: Secondary | ICD-10-CM | POA: Diagnosis not present

## 2014-08-17 DIAGNOSIS — Z79899 Other long term (current) drug therapy: Secondary | ICD-10-CM | POA: Diagnosis not present

## 2014-08-17 DIAGNOSIS — R110 Nausea: Secondary | ICD-10-CM | POA: Diagnosis not present

## 2014-08-17 DIAGNOSIS — F418 Other specified anxiety disorders: Secondary | ICD-10-CM | POA: Diagnosis not present

## 2014-08-17 DIAGNOSIS — K219 Gastro-esophageal reflux disease without esophagitis: Secondary | ICD-10-CM | POA: Diagnosis not present

## 2014-08-17 DIAGNOSIS — D649 Anemia, unspecified: Secondary | ICD-10-CM | POA: Diagnosis not present

## 2014-08-17 DIAGNOSIS — E780 Pure hypercholesterolemia: Secondary | ICD-10-CM | POA: Diagnosis not present

## 2014-08-17 DIAGNOSIS — H3530 Unspecified macular degeneration: Secondary | ICD-10-CM | POA: Diagnosis not present

## 2014-08-27 ENCOUNTER — Encounter: Payer: Self-pay | Admitting: Internal Medicine

## 2014-09-02 DIAGNOSIS — S92354D Nondisplaced fracture of fifth metatarsal bone, right foot, subsequent encounter for fracture with routine healing: Secondary | ICD-10-CM | POA: Diagnosis not present

## 2014-09-02 DIAGNOSIS — M79671 Pain in right foot: Secondary | ICD-10-CM | POA: Diagnosis not present

## 2014-09-30 DIAGNOSIS — L110 Acquired keratosis follicularis: Secondary | ICD-10-CM | POA: Diagnosis not present

## 2014-09-30 DIAGNOSIS — L609 Nail disorder, unspecified: Secondary | ICD-10-CM | POA: Diagnosis not present

## 2014-09-30 DIAGNOSIS — I739 Peripheral vascular disease, unspecified: Secondary | ICD-10-CM | POA: Diagnosis not present

## 2014-10-16 DIAGNOSIS — I482 Chronic atrial fibrillation: Secondary | ICD-10-CM | POA: Diagnosis not present

## 2014-10-16 DIAGNOSIS — N183 Chronic kidney disease, stage 3 (moderate): Secondary | ICD-10-CM | POA: Diagnosis not present

## 2014-10-16 DIAGNOSIS — E039 Hypothyroidism, unspecified: Secondary | ICD-10-CM | POA: Diagnosis not present

## 2014-10-16 DIAGNOSIS — E871 Hypo-osmolality and hyponatremia: Secondary | ICD-10-CM | POA: Diagnosis not present

## 2014-10-16 DIAGNOSIS — D649 Anemia, unspecified: Secondary | ICD-10-CM | POA: Diagnosis not present

## 2014-10-16 DIAGNOSIS — K219 Gastro-esophageal reflux disease without esophagitis: Secondary | ICD-10-CM | POA: Diagnosis not present

## 2014-10-23 DIAGNOSIS — I482 Chronic atrial fibrillation: Secondary | ICD-10-CM | POA: Diagnosis not present

## 2014-10-23 DIAGNOSIS — K5900 Constipation, unspecified: Secondary | ICD-10-CM | POA: Diagnosis not present

## 2014-10-23 DIAGNOSIS — K5730 Diverticulosis of large intestine without perforation or abscess without bleeding: Secondary | ICD-10-CM | POA: Diagnosis not present

## 2014-10-23 DIAGNOSIS — E871 Hypo-osmolality and hyponatremia: Secondary | ICD-10-CM | POA: Diagnosis not present

## 2014-10-23 DIAGNOSIS — D649 Anemia, unspecified: Secondary | ICD-10-CM | POA: Diagnosis not present

## 2014-10-23 DIAGNOSIS — I495 Sick sinus syndrome: Secondary | ICD-10-CM | POA: Diagnosis not present

## 2014-10-23 DIAGNOSIS — K219 Gastro-esophageal reflux disease without esophagitis: Secondary | ICD-10-CM | POA: Diagnosis not present

## 2014-10-23 DIAGNOSIS — F0631 Mood disorder due to known physiological condition with depressive features: Secondary | ICD-10-CM | POA: Diagnosis not present

## 2014-10-23 DIAGNOSIS — F419 Anxiety disorder, unspecified: Secondary | ICD-10-CM | POA: Diagnosis not present

## 2014-10-23 DIAGNOSIS — E039 Hypothyroidism, unspecified: Secondary | ICD-10-CM | POA: Diagnosis not present

## 2014-10-23 DIAGNOSIS — F0390 Unspecified dementia without behavioral disturbance: Secondary | ICD-10-CM | POA: Diagnosis not present

## 2014-10-23 DIAGNOSIS — K222 Esophageal obstruction: Secondary | ICD-10-CM | POA: Diagnosis not present

## 2014-12-04 ENCOUNTER — Encounter (INDEPENDENT_AMBULATORY_CARE_PROVIDER_SITE_OTHER): Payer: MEDICARE | Admitting: Ophthalmology

## 2014-12-04 DIAGNOSIS — H43813 Vitreous degeneration, bilateral: Secondary | ICD-10-CM | POA: Diagnosis not present

## 2014-12-04 DIAGNOSIS — H3531 Nonexudative age-related macular degeneration: Secondary | ICD-10-CM

## 2014-12-04 DIAGNOSIS — H318 Other specified disorders of choroid: Secondary | ICD-10-CM | POA: Diagnosis not present

## 2014-12-09 DIAGNOSIS — L609 Nail disorder, unspecified: Secondary | ICD-10-CM | POA: Diagnosis not present

## 2014-12-09 DIAGNOSIS — I739 Peripheral vascular disease, unspecified: Secondary | ICD-10-CM | POA: Diagnosis not present

## 2014-12-09 DIAGNOSIS — L110 Acquired keratosis follicularis: Secondary | ICD-10-CM | POA: Diagnosis not present

## 2014-12-11 ENCOUNTER — Other Ambulatory Visit: Payer: Self-pay | Admitting: Cardiology

## 2014-12-12 DIAGNOSIS — R1319 Other dysphagia: Secondary | ICD-10-CM | POA: Diagnosis not present

## 2014-12-18 ENCOUNTER — Ambulatory Visit (INDEPENDENT_AMBULATORY_CARE_PROVIDER_SITE_OTHER): Payer: MEDICARE | Admitting: Ophthalmology

## 2014-12-18 DIAGNOSIS — H353114 Nonexudative age-related macular degeneration, right eye, advanced atrophic with subfoveal involvement: Secondary | ICD-10-CM

## 2014-12-18 DIAGNOSIS — H353221 Exudative age-related macular degeneration, left eye, with active choroidal neovascularization: Secondary | ICD-10-CM

## 2014-12-18 DIAGNOSIS — H353212 Exudative age-related macular degeneration, right eye, with inactive choroidal neovascularization: Secondary | ICD-10-CM

## 2014-12-19 DIAGNOSIS — I495 Sick sinus syndrome: Secondary | ICD-10-CM | POA: Diagnosis not present

## 2014-12-19 DIAGNOSIS — N183 Chronic kidney disease, stage 3 (moderate): Secondary | ICD-10-CM | POA: Diagnosis not present

## 2014-12-19 DIAGNOSIS — S83242A Other tear of medial meniscus, current injury, left knee, initial encounter: Secondary | ICD-10-CM | POA: Diagnosis not present

## 2014-12-19 DIAGNOSIS — M1712 Unilateral primary osteoarthritis, left knee: Secondary | ICD-10-CM | POA: Diagnosis not present

## 2014-12-19 DIAGNOSIS — D649 Anemia, unspecified: Secondary | ICD-10-CM | POA: Diagnosis not present

## 2014-12-19 DIAGNOSIS — I482 Chronic atrial fibrillation: Secondary | ICD-10-CM | POA: Diagnosis not present

## 2014-12-19 DIAGNOSIS — Z23 Encounter for immunization: Secondary | ICD-10-CM | POA: Diagnosis not present

## 2015-01-03 DIAGNOSIS — I482 Chronic atrial fibrillation: Secondary | ICD-10-CM | POA: Diagnosis not present

## 2015-01-03 DIAGNOSIS — N183 Chronic kidney disease, stage 3 (moderate): Secondary | ICD-10-CM | POA: Diagnosis not present

## 2015-01-03 DIAGNOSIS — J019 Acute sinusitis, unspecified: Secondary | ICD-10-CM | POA: Diagnosis not present

## 2015-01-03 DIAGNOSIS — K219 Gastro-esophageal reflux disease without esophagitis: Secondary | ICD-10-CM | POA: Diagnosis not present

## 2015-01-03 DIAGNOSIS — R630 Anorexia: Secondary | ICD-10-CM | POA: Diagnosis not present

## 2015-01-03 DIAGNOSIS — S83242A Other tear of medial meniscus, current injury, left knee, initial encounter: Secondary | ICD-10-CM | POA: Diagnosis not present

## 2015-01-03 DIAGNOSIS — M1712 Unilateral primary osteoarthritis, left knee: Secondary | ICD-10-CM | POA: Diagnosis not present

## 2015-01-03 DIAGNOSIS — I495 Sick sinus syndrome: Secondary | ICD-10-CM | POA: Diagnosis not present

## 2015-01-09 DIAGNOSIS — R110 Nausea: Secondary | ICD-10-CM | POA: Diagnosis not present

## 2015-01-15 DIAGNOSIS — K219 Gastro-esophageal reflux disease without esophagitis: Secondary | ICD-10-CM | POA: Diagnosis not present

## 2015-01-15 DIAGNOSIS — R110 Nausea: Secondary | ICD-10-CM | POA: Diagnosis not present

## 2015-01-15 DIAGNOSIS — Z9889 Other specified postprocedural states: Secondary | ICD-10-CM | POA: Diagnosis not present

## 2015-01-15 DIAGNOSIS — K224 Dyskinesia of esophagus: Secondary | ICD-10-CM | POA: Diagnosis not present

## 2015-01-15 DIAGNOSIS — K449 Diaphragmatic hernia without obstruction or gangrene: Secondary | ICD-10-CM | POA: Diagnosis not present

## 2015-01-17 ENCOUNTER — Ambulatory Visit (INDEPENDENT_AMBULATORY_CARE_PROVIDER_SITE_OTHER): Payer: MEDICARE | Admitting: *Deleted

## 2015-01-17 DIAGNOSIS — I480 Paroxysmal atrial fibrillation: Secondary | ICD-10-CM

## 2015-01-17 DIAGNOSIS — I495 Sick sinus syndrome: Secondary | ICD-10-CM

## 2015-01-17 LAB — CUP PACEART INCLINIC DEVICE CHECK
Battery Impedance: 154 Ohm
Battery Remaining Longevity: 130 mo
Battery Voltage: 2.79 V
Brady Statistic AP VS Percent: 92 %
Date Time Interrogation Session: 20161104162150
Implantable Lead Implant Date: 20140424
Implantable Lead Implant Date: 20140424
Implantable Lead Location: 753859
Implantable Lead Location: 753860
Implantable Lead Model: 5076
Lead Channel Impedance Value: 591 Ohm
Lead Channel Impedance Value: 696 Ohm
Lead Channel Pacing Threshold Pulse Width: 0.4 ms
Lead Channel Sensing Intrinsic Amplitude: 1.4 mV
Lead Channel Setting Pacing Amplitude: 2 V
Lead Channel Setting Pacing Amplitude: 2.5 V
Lead Channel Setting Pacing Pulse Width: 0.4 ms
MDC IDC LEAD MODEL: 1948
MDC IDC MSMT LEADCHNL RA PACING THRESHOLD AMPLITUDE: 0.5 V
MDC IDC MSMT LEADCHNL RA PACING THRESHOLD PULSEWIDTH: 0.4 ms
MDC IDC MSMT LEADCHNL RV PACING THRESHOLD AMPLITUDE: 0.5 V
MDC IDC MSMT LEADCHNL RV SENSING INTR AMPL: 11.2 mV
MDC IDC SET LEADCHNL RV SENSING SENSITIVITY: 4 mV
MDC IDC STAT BRADY AP VP PERCENT: 0 %
MDC IDC STAT BRADY AS VP PERCENT: 0 %
MDC IDC STAT BRADY AS VS PERCENT: 8 %

## 2015-01-17 NOTE — Progress Notes (Signed)
Pacemaker check in clinic. Normal device function. Thresholds, sensing, impedances consistent with previous measurements. Device programmed to maximize longevity. 2.9+% mode switched +eliquis. 12 high ventricular rates noted- longest 17 seconds, pk V 240 (markers only). Device programmed at appropriate safety margins. Histogram distribution appropriate for patient activity level. Device programmed to optimize intrinsic conduction. Estimated longevity 9-12.5 years. Patient enrolled in remote follow-up. Carelink 04/21/15, ROV with JA/[place] 07/18/15.

## 2015-01-22 DIAGNOSIS — D649 Anemia, unspecified: Secondary | ICD-10-CM | POA: Diagnosis not present

## 2015-01-22 DIAGNOSIS — N183 Chronic kidney disease, stage 3 (moderate): Secondary | ICD-10-CM | POA: Diagnosis not present

## 2015-01-22 DIAGNOSIS — R5383 Other fatigue: Secondary | ICD-10-CM | POA: Diagnosis not present

## 2015-01-22 DIAGNOSIS — K219 Gastro-esophageal reflux disease without esophagitis: Secondary | ICD-10-CM | POA: Diagnosis not present

## 2015-01-23 DIAGNOSIS — R110 Nausea: Secondary | ICD-10-CM | POA: Diagnosis not present

## 2015-01-27 ENCOUNTER — Encounter: Payer: Self-pay | Admitting: Internal Medicine

## 2015-01-29 ENCOUNTER — Encounter (INDEPENDENT_AMBULATORY_CARE_PROVIDER_SITE_OTHER): Payer: MEDICARE | Admitting: Ophthalmology

## 2015-01-29 DIAGNOSIS — H353114 Nonexudative age-related macular degeneration, right eye, advanced atrophic with subfoveal involvement: Secondary | ICD-10-CM | POA: Diagnosis not present

## 2015-01-29 DIAGNOSIS — H318 Other specified disorders of choroid: Secondary | ICD-10-CM | POA: Diagnosis not present

## 2015-01-30 DIAGNOSIS — D649 Anemia, unspecified: Secondary | ICD-10-CM | POA: Diagnosis not present

## 2015-01-30 DIAGNOSIS — K219 Gastro-esophageal reflux disease without esophagitis: Secondary | ICD-10-CM | POA: Diagnosis not present

## 2015-01-30 DIAGNOSIS — K5730 Diverticulosis of large intestine without perforation or abscess without bleeding: Secondary | ICD-10-CM | POA: Diagnosis not present

## 2015-01-30 DIAGNOSIS — R630 Anorexia: Secondary | ICD-10-CM | POA: Diagnosis not present

## 2015-01-30 DIAGNOSIS — E039 Hypothyroidism, unspecified: Secondary | ICD-10-CM | POA: Diagnosis not present

## 2015-01-30 DIAGNOSIS — I482 Chronic atrial fibrillation: Secondary | ICD-10-CM | POA: Diagnosis not present

## 2015-01-30 DIAGNOSIS — I495 Sick sinus syndrome: Secondary | ICD-10-CM | POA: Diagnosis not present

## 2015-01-30 DIAGNOSIS — F0390 Unspecified dementia without behavioral disturbance: Secondary | ICD-10-CM | POA: Diagnosis not present

## 2015-01-30 DIAGNOSIS — K222 Esophageal obstruction: Secondary | ICD-10-CM | POA: Diagnosis not present

## 2015-01-30 DIAGNOSIS — R110 Nausea: Secondary | ICD-10-CM | POA: Diagnosis not present

## 2015-01-30 DIAGNOSIS — F419 Anxiety disorder, unspecified: Secondary | ICD-10-CM | POA: Diagnosis not present

## 2015-01-30 DIAGNOSIS — F0631 Mood disorder due to known physiological condition with depressive features: Secondary | ICD-10-CM | POA: Diagnosis not present

## 2015-02-04 DIAGNOSIS — R110 Nausea: Secondary | ICD-10-CM | POA: Diagnosis not present

## 2015-02-17 DIAGNOSIS — L609 Nail disorder, unspecified: Secondary | ICD-10-CM | POA: Diagnosis not present

## 2015-02-17 DIAGNOSIS — L110 Acquired keratosis follicularis: Secondary | ICD-10-CM | POA: Diagnosis not present

## 2015-02-17 DIAGNOSIS — I739 Peripheral vascular disease, unspecified: Secondary | ICD-10-CM | POA: Diagnosis not present

## 2015-04-21 ENCOUNTER — Telehealth: Payer: Self-pay | Admitting: Cardiology

## 2015-04-21 ENCOUNTER — Ambulatory Visit (INDEPENDENT_AMBULATORY_CARE_PROVIDER_SITE_OTHER): Payer: MEDICARE | Admitting: *Deleted

## 2015-04-21 DIAGNOSIS — I495 Sick sinus syndrome: Secondary | ICD-10-CM

## 2015-04-21 NOTE — Telephone Encounter (Signed)
Confirmed remote transmission w/ pt wife.   

## 2015-04-22 NOTE — Progress Notes (Signed)
Remote pacemaker transmission.   

## 2015-04-28 DIAGNOSIS — I739 Peripheral vascular disease, unspecified: Secondary | ICD-10-CM | POA: Diagnosis not present

## 2015-04-28 DIAGNOSIS — L609 Nail disorder, unspecified: Secondary | ICD-10-CM | POA: Diagnosis not present

## 2015-04-28 DIAGNOSIS — L110 Acquired keratosis follicularis: Secondary | ICD-10-CM | POA: Diagnosis not present

## 2015-05-01 ENCOUNTER — Encounter (INDEPENDENT_AMBULATORY_CARE_PROVIDER_SITE_OTHER): Payer: MEDICARE | Admitting: Ophthalmology

## 2015-05-01 DIAGNOSIS — H43813 Vitreous degeneration, bilateral: Secondary | ICD-10-CM

## 2015-05-01 DIAGNOSIS — H318 Other specified disorders of choroid: Secondary | ICD-10-CM

## 2015-05-01 DIAGNOSIS — H353114 Nonexudative age-related macular degeneration, right eye, advanced atrophic with subfoveal involvement: Secondary | ICD-10-CM

## 2015-05-01 DIAGNOSIS — H353221 Exudative age-related macular degeneration, left eye, with active choroidal neovascularization: Secondary | ICD-10-CM

## 2015-05-06 DIAGNOSIS — N183 Chronic kidney disease, stage 3 (moderate): Secondary | ICD-10-CM | POA: Diagnosis not present

## 2015-05-06 DIAGNOSIS — E039 Hypothyroidism, unspecified: Secondary | ICD-10-CM | POA: Diagnosis not present

## 2015-05-06 DIAGNOSIS — E871 Hypo-osmolality and hyponatremia: Secondary | ICD-10-CM | POA: Diagnosis not present

## 2015-05-06 DIAGNOSIS — I482 Chronic atrial fibrillation: Secondary | ICD-10-CM | POA: Diagnosis not present

## 2015-05-06 DIAGNOSIS — D649 Anemia, unspecified: Secondary | ICD-10-CM | POA: Diagnosis not present

## 2015-05-06 DIAGNOSIS — K219 Gastro-esophageal reflux disease without esophagitis: Secondary | ICD-10-CM | POA: Diagnosis not present

## 2015-05-11 LAB — CUP PACEART REMOTE DEVICE CHECK
Battery Impedance: 155 Ohm
Battery Remaining Longevity: 130 mo
Brady Statistic AP VP Percent: 0 %
Brady Statistic AS VP Percent: 0 %
Brady Statistic AS VS Percent: 7 %
Date Time Interrogation Session: 20170206185320
Implantable Lead Location: 753859
Implantable Lead Location: 753860
Implantable Lead Model: 1948
Implantable Lead Model: 5076
Lead Channel Pacing Threshold Amplitude: 0.5 V
Lead Channel Pacing Threshold Amplitude: 0.625 V
Lead Channel Sensing Intrinsic Amplitude: 11.2 mV
Lead Channel Setting Pacing Pulse Width: 0.4 ms
MDC IDC LEAD IMPLANT DT: 20140424
MDC IDC LEAD IMPLANT DT: 20140424
MDC IDC MSMT BATTERY VOLTAGE: 2.79 V
MDC IDC MSMT LEADCHNL RA IMPEDANCE VALUE: 609 Ohm
MDC IDC MSMT LEADCHNL RA PACING THRESHOLD PULSEWIDTH: 0.4 ms
MDC IDC MSMT LEADCHNL RV IMPEDANCE VALUE: 765 Ohm
MDC IDC MSMT LEADCHNL RV PACING THRESHOLD PULSEWIDTH: 0.4 ms
MDC IDC SET LEADCHNL RA PACING AMPLITUDE: 2 V
MDC IDC SET LEADCHNL RV PACING AMPLITUDE: 2.5 V
MDC IDC SET LEADCHNL RV SENSING SENSITIVITY: 4 mV
MDC IDC STAT BRADY AP VS PERCENT: 93 %

## 2015-05-13 DIAGNOSIS — E039 Hypothyroidism, unspecified: Secondary | ICD-10-CM | POA: Diagnosis not present

## 2015-05-13 DIAGNOSIS — F0390 Unspecified dementia without behavioral disturbance: Secondary | ICD-10-CM | POA: Diagnosis not present

## 2015-05-13 DIAGNOSIS — I495 Sick sinus syndrome: Secondary | ICD-10-CM | POA: Diagnosis not present

## 2015-05-13 DIAGNOSIS — E871 Hypo-osmolality and hyponatremia: Secondary | ICD-10-CM | POA: Diagnosis not present

## 2015-05-13 DIAGNOSIS — K219 Gastro-esophageal reflux disease without esophagitis: Secondary | ICD-10-CM | POA: Diagnosis not present

## 2015-05-13 DIAGNOSIS — K5900 Constipation, unspecified: Secondary | ICD-10-CM | POA: Diagnosis not present

## 2015-05-13 DIAGNOSIS — F419 Anxiety disorder, unspecified: Secondary | ICD-10-CM | POA: Diagnosis not present

## 2015-05-13 DIAGNOSIS — K5730 Diverticulosis of large intestine without perforation or abscess without bleeding: Secondary | ICD-10-CM | POA: Diagnosis not present

## 2015-05-13 DIAGNOSIS — D649 Anemia, unspecified: Secondary | ICD-10-CM | POA: Diagnosis not present

## 2015-05-13 DIAGNOSIS — N183 Chronic kidney disease, stage 3 (moderate): Secondary | ICD-10-CM | POA: Diagnosis not present

## 2015-05-13 DIAGNOSIS — I482 Chronic atrial fibrillation: Secondary | ICD-10-CM | POA: Diagnosis not present

## 2015-05-13 DIAGNOSIS — K222 Esophageal obstruction: Secondary | ICD-10-CM | POA: Diagnosis not present

## 2015-05-14 ENCOUNTER — Encounter: Payer: Self-pay | Admitting: Cardiology

## 2015-05-20 DIAGNOSIS — M25579 Pain in unspecified ankle and joints of unspecified foot: Secondary | ICD-10-CM | POA: Diagnosis not present

## 2015-05-20 DIAGNOSIS — M79672 Pain in left foot: Secondary | ICD-10-CM | POA: Diagnosis not present

## 2015-05-27 DIAGNOSIS — M25579 Pain in unspecified ankle and joints of unspecified foot: Secondary | ICD-10-CM | POA: Diagnosis not present

## 2015-05-27 DIAGNOSIS — M79672 Pain in left foot: Secondary | ICD-10-CM | POA: Diagnosis not present

## 2015-07-14 DIAGNOSIS — L110 Acquired keratosis follicularis: Secondary | ICD-10-CM | POA: Diagnosis not present

## 2015-07-14 DIAGNOSIS — L609 Nail disorder, unspecified: Secondary | ICD-10-CM | POA: Diagnosis not present

## 2015-07-14 DIAGNOSIS — I739 Peripheral vascular disease, unspecified: Secondary | ICD-10-CM | POA: Diagnosis not present

## 2015-07-18 ENCOUNTER — Encounter: Payer: Self-pay | Admitting: Internal Medicine

## 2015-07-18 ENCOUNTER — Ambulatory Visit (INDEPENDENT_AMBULATORY_CARE_PROVIDER_SITE_OTHER): Payer: MEDICARE | Admitting: Internal Medicine

## 2015-07-18 VITALS — BP 128/76 | HR 72 | Ht 68.0 in | Wt 179.0 lb

## 2015-07-18 DIAGNOSIS — I480 Paroxysmal atrial fibrillation: Secondary | ICD-10-CM

## 2015-07-18 DIAGNOSIS — I495 Sick sinus syndrome: Secondary | ICD-10-CM | POA: Diagnosis not present

## 2015-07-18 LAB — CUP PACEART INCLINIC DEVICE CHECK
Battery Remaining Longevity: 125 mo
Brady Statistic AP VP Percent: 0 %
Brady Statistic AP VS Percent: 92 %
Brady Statistic AS VP Percent: 0 %
Brady Statistic AS VS Percent: 8 %
Implantable Lead Implant Date: 20140424
Implantable Lead Location: 753860
Implantable Lead Model: 1948
Lead Channel Pacing Threshold Amplitude: 0.625 V
Lead Channel Pacing Threshold Amplitude: 0.75 V
Lead Channel Pacing Threshold Pulse Width: 0.4 ms
Lead Channel Pacing Threshold Pulse Width: 0.4 ms
Lead Channel Pacing Threshold Pulse Width: 0.4 ms
Lead Channel Sensing Intrinsic Amplitude: 0.7 mV
MDC IDC LEAD IMPLANT DT: 20140424
MDC IDC LEAD LOCATION: 753859
MDC IDC MSMT BATTERY IMPEDANCE: 178 Ohm
MDC IDC MSMT BATTERY VOLTAGE: 2.8 V
MDC IDC MSMT LEADCHNL RA IMPEDANCE VALUE: 609 Ohm
MDC IDC MSMT LEADCHNL RV IMPEDANCE VALUE: 661 Ohm
MDC IDC MSMT LEADCHNL RV PACING THRESHOLD AMPLITUDE: 0.5 V
MDC IDC MSMT LEADCHNL RV PACING THRESHOLD AMPLITUDE: 0.5 V
MDC IDC MSMT LEADCHNL RV PACING THRESHOLD PULSEWIDTH: 0.4 ms
MDC IDC MSMT LEADCHNL RV SENSING INTR AMPL: 11.2 mV
MDC IDC SESS DTM: 20170505122640
MDC IDC SET LEADCHNL RA PACING AMPLITUDE: 2 V
MDC IDC SET LEADCHNL RV PACING AMPLITUDE: 2.5 V
MDC IDC SET LEADCHNL RV PACING PULSEWIDTH: 0.4 ms
MDC IDC SET LEADCHNL RV SENSING SENSITIVITY: 4 mV

## 2015-07-18 NOTE — Progress Notes (Signed)
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  He is not as active.  Today, he denies symptoms of palpitations, chest pain, shortness of breath,  lower extremity edema, dizziness, presyncope, or syncope.  The patient is otherwise without complaint today.  His fatigue and depression seems stable.  Past Medical History  Diagnosis Date  . Paroxysmal atrial fibrillation [facility]) May 2011  . Chest pain     unspecified  . Gastroesophageal reflux disease   . Hypothyroidism   . Depression   . Transient ischemic attack     high risk for thromboembolic disease, secondary to atrial fibrillation, patient declines Coumadin due to prior history of severe epistaxis. Patient not willing to restart Coumadin therapy  . Drug therapy     Coumadin Therapy  . Chest pain, atypical   . Gastroesophageal reflux disease   . Depression   . Tachycardia-bradycardia syndrome ([place])     7 second pauses documented   Past Surgical History  Procedure Laterality Date  . Abdominal surgery      Abdominal diverticultis  . Pacemaker insertion  07/06/12    MDT Adapta L implanted by Dr Caryl Comes  . Permanent pacemaker insertion N/A 07/06/2012    Procedure: PERMANENT PACEMAKER INSERTION;  Surgeon: Deboraha Sprang, MD;  Location: [facility] CATH LAB;  Service: Cardiovascular;  Laterality: N/A;    Current Outpatient Prescriptions  Medication Sig Dispense Refill  . ELIQUIS 5 MG TABS tablet take 1 tablet by mouth twice a day 60 tablet 6  . levothyroxine (SYNTHROID, LEVOTHROID) 25 MCG tablet Take 25 mcg by mouth daily.      . memantine (NAMENDA) 10 MG tablet Take 1 tablet by mouth 2 (two) times daily.  0  . Multiple Vitamins-Minerals (OCUVITE PRESERVISION) TABS Take 1 tablet by mouth 2 (two) times daily.      Marland Kitchen omeprazole (PRILOSEC) 40 MG capsule Take 1 capsule by mouth daily.  0  . simvastatin (ZOCOR) 20 MG tablet Take 20 mg  by mouth at bedtime.      . tamsulosin (FLOMAX) 0.4 MG CAPS capsule Take 0.4 mg by mouth daily after supper.      No current facility-administered medications for this visit.    Physical Exam: Filed Vitals:   07/18/15 0808  BP: 128/76  Pulse: 72  Height: 5\' 8"  (1.727 m)  Weight: 179 lb (81.194 kg)    GEN- The patient is elderly appearing, alert and oriented x 3 today.   Head- normocephalic, atraumatic Eyes-  Sclera clear, conjunctiva pink, poor vision Ears- hearing is decreased Oropharynx- clear Lungs- Clear to ausculation bilaterally, normal work of breathing Chest- pacemaker pocket is well healed Heart- Regular rate and rhythm, no murmurs, rubs or gallops, PMI not laterally displaced GI- soft, NT, ND, + BS Extremities- no clubbing, cyanosis, or edema  Pacemaker interrogation- reviewed in detail today,  See PACEART report  Assessment and Plan:  1. Symptomatic Bradycardia/ tachybrady syndrome Normal pacemaker function See Pace Art report No changes today  2. Paroxysmal afib afib burden 3% by PPM interrogation today Continue eliquis.  PCP is following BMET and CBC .  Would reduce to 2.5mg  BID if creatinine is >1.5 Some elevated V rates are noted.  He is asymptomatic.  Does not wish to add rate control at this time but may be willing to consider if symptoms occur.  3. Fatigue stable  Carelink Return to see me in  1 year  Thompson Grayer MD, [facility] 07/18/2015 8:47 AM

## 2015-07-18 NOTE — Patient Instructions (Signed)
Your physician recommends that you continue on your current medications as directed. Please refer to the Current Medication list given to you today. Device check on 10/20/15. Your physician recommends that you schedule a follow-up appointment in: 1 year with Dr. Allred. Please schedule this appointment today before leaving the office.  

## 2015-08-07 DIAGNOSIS — I495 Sick sinus syndrome: Secondary | ICD-10-CM | POA: Diagnosis not present

## 2015-08-07 DIAGNOSIS — F0390 Unspecified dementia without behavioral disturbance: Secondary | ICD-10-CM | POA: Diagnosis not present

## 2015-08-07 DIAGNOSIS — H6122 Impacted cerumen, left ear: Secondary | ICD-10-CM | POA: Diagnosis not present

## 2015-08-07 DIAGNOSIS — F0631 Mood disorder due to known physiological condition with depressive features: Secondary | ICD-10-CM | POA: Diagnosis not present

## 2015-08-07 DIAGNOSIS — F419 Anxiety disorder, unspecified: Secondary | ICD-10-CM | POA: Diagnosis not present

## 2015-08-07 DIAGNOSIS — K219 Gastro-esophageal reflux disease without esophagitis: Secondary | ICD-10-CM | POA: Diagnosis not present

## 2015-08-07 DIAGNOSIS — E039 Hypothyroidism, unspecified: Secondary | ICD-10-CM | POA: Diagnosis not present

## 2015-08-07 DIAGNOSIS — I482 Chronic atrial fibrillation: Secondary | ICD-10-CM | POA: Diagnosis not present

## 2015-08-12 ENCOUNTER — Other Ambulatory Visit: Payer: Self-pay | Admitting: Cardiology

## 2015-09-16 ENCOUNTER — Other Ambulatory Visit: Payer: Self-pay | Admitting: Cardiology

## 2015-09-22 DIAGNOSIS — L110 Acquired keratosis follicularis: Secondary | ICD-10-CM | POA: Diagnosis not present

## 2015-09-22 DIAGNOSIS — I739 Peripheral vascular disease, unspecified: Secondary | ICD-10-CM | POA: Diagnosis not present

## 2015-09-22 DIAGNOSIS — L609 Nail disorder, unspecified: Secondary | ICD-10-CM | POA: Diagnosis not present

## 2015-10-16 ENCOUNTER — Other Ambulatory Visit: Payer: Self-pay | Admitting: Cardiology

## 2015-10-20 ENCOUNTER — Telehealth: Payer: Self-pay | Admitting: Cardiology

## 2015-10-20 ENCOUNTER — Ambulatory Visit (INDEPENDENT_AMBULATORY_CARE_PROVIDER_SITE_OTHER): Payer: MEDICARE | Admitting: *Deleted

## 2015-10-20 DIAGNOSIS — I495 Sick sinus syndrome: Secondary | ICD-10-CM

## 2015-10-20 NOTE — Progress Notes (Signed)
Remote pacemaker transmission.   

## 2015-10-20 NOTE — Telephone Encounter (Signed)
Confirmed remote transmission w/ pt wife.   

## 2015-10-22 ENCOUNTER — Encounter: Payer: Self-pay | Admitting: Cardiology

## 2015-10-22 NOTE — Progress Notes (Signed)
Letter  

## 2015-10-23 LAB — CUP PACEART REMOTE DEVICE CHECK
Battery Impedance: 178 Ohm
Battery Remaining Longevity: 125 mo
Battery Voltage: 2.8 V
Brady Statistic AP VP Percent: 0 %
Brady Statistic AS VP Percent: 0 %
Brady Statistic AS VS Percent: 10 %
Implantable Lead Implant Date: 20140424
Implantable Lead Implant Date: 20140424
Implantable Lead Location: 753859
Implantable Lead Model: 1948
Implantable Lead Model: 5076
Lead Channel Impedance Value: 619 Ohm
Lead Channel Impedance Value: 781 Ohm
Lead Channel Pacing Threshold Amplitude: 0.375 V
Lead Channel Pacing Threshold Amplitude: 0.625 V
Lead Channel Setting Pacing Amplitude: 2 V
Lead Channel Setting Pacing Amplitude: 2.5 V
MDC IDC LEAD LOCATION: 753860
MDC IDC MSMT LEADCHNL RA PACING THRESHOLD PULSEWIDTH: 0.4 ms
MDC IDC MSMT LEADCHNL RV PACING THRESHOLD PULSEWIDTH: 0.4 ms
MDC IDC SESS DTM: 20170807174118
MDC IDC SET LEADCHNL RV PACING PULSEWIDTH: 0.46 ms
MDC IDC SET LEADCHNL RV SENSING SENSITIVITY: 4 mV
MDC IDC STAT BRADY AP VS PERCENT: 89 %

## 2015-10-29 ENCOUNTER — Encounter (INDEPENDENT_AMBULATORY_CARE_PROVIDER_SITE_OTHER): Payer: Self-pay | Admitting: Ophthalmology

## 2015-11-04 DIAGNOSIS — K219 Gastro-esophageal reflux disease without esophagitis: Secondary | ICD-10-CM | POA: Diagnosis not present

## 2015-11-04 DIAGNOSIS — E039 Hypothyroidism, unspecified: Secondary | ICD-10-CM | POA: Diagnosis not present

## 2015-11-04 DIAGNOSIS — N183 Chronic kidney disease, stage 3 (moderate): Secondary | ICD-10-CM | POA: Diagnosis not present

## 2015-11-04 DIAGNOSIS — R5383 Other fatigue: Secondary | ICD-10-CM | POA: Diagnosis not present

## 2015-11-04 DIAGNOSIS — D649 Anemia, unspecified: Secondary | ICD-10-CM | POA: Diagnosis not present

## 2015-11-06 DIAGNOSIS — E039 Hypothyroidism, unspecified: Secondary | ICD-10-CM | POA: Diagnosis not present

## 2015-11-06 DIAGNOSIS — Z6826 Body mass index (BMI) 26.0-26.9, adult: Secondary | ICD-10-CM | POA: Diagnosis not present

## 2015-11-06 DIAGNOSIS — F419 Anxiety disorder, unspecified: Secondary | ICD-10-CM | POA: Diagnosis not present

## 2015-11-06 DIAGNOSIS — D649 Anemia, unspecified: Secondary | ICD-10-CM | POA: Diagnosis not present

## 2015-11-06 DIAGNOSIS — F0390 Unspecified dementia without behavioral disturbance: Secondary | ICD-10-CM | POA: Diagnosis not present

## 2015-11-06 DIAGNOSIS — F0631 Mood disorder due to known physiological condition with depressive features: Secondary | ICD-10-CM | POA: Diagnosis not present

## 2015-11-06 DIAGNOSIS — E871 Hypo-osmolality and hyponatremia: Secondary | ICD-10-CM | POA: Diagnosis not present

## 2015-11-06 DIAGNOSIS — N183 Chronic kidney disease, stage 3 (moderate): Secondary | ICD-10-CM | POA: Diagnosis not present

## 2015-11-15 ENCOUNTER — Other Ambulatory Visit: Payer: Self-pay | Admitting: Cardiology

## 2015-11-18 ENCOUNTER — Other Ambulatory Visit: Payer: Self-pay | Admitting: *Deleted

## 2015-11-18 MED ORDER — APIXABAN 5 MG PO TABS
5.0000 mg | ORAL_TABLET | Freq: Two times a day (BID) | ORAL | 0 refills | Status: DC
Start: 2015-11-18 — End: 2015-12-15

## 2015-11-25 DIAGNOSIS — Z23 Encounter for immunization: Secondary | ICD-10-CM | POA: Diagnosis not present

## 2015-12-02 DIAGNOSIS — L609 Nail disorder, unspecified: Secondary | ICD-10-CM | POA: Diagnosis not present

## 2015-12-02 DIAGNOSIS — L110 Acquired keratosis follicularis: Secondary | ICD-10-CM | POA: Diagnosis not present

## 2015-12-02 DIAGNOSIS — I739 Peripheral vascular disease, unspecified: Secondary | ICD-10-CM | POA: Diagnosis not present

## 2015-12-15 ENCOUNTER — Other Ambulatory Visit: Payer: Self-pay | Admitting: Cardiology

## 2015-12-30 ENCOUNTER — Encounter: Payer: Self-pay | Admitting: Cardiology

## 2015-12-30 ENCOUNTER — Ambulatory Visit (INDEPENDENT_AMBULATORY_CARE_PROVIDER_SITE_OTHER): Payer: MEDICARE | Admitting: Cardiology

## 2015-12-30 ENCOUNTER — Encounter: Payer: Self-pay | Admitting: *Deleted

## 2015-12-30 VITALS — BP 121/72 | HR 72 | Ht 68.0 in | Wt 176.6 lb

## 2015-12-30 DIAGNOSIS — I495 Sick sinus syndrome: Secondary | ICD-10-CM

## 2015-12-30 DIAGNOSIS — I4891 Unspecified atrial fibrillation: Secondary | ICD-10-CM | POA: Diagnosis not present

## 2015-12-30 MED ORDER — APIXABAN 5 MG PO TABS: 5.0000 mg | ORAL_TABLET | Freq: Two times a day (BID) | ORAL | 3 refills | Status: DC

## 2015-12-30 MED ORDER — METOPROLOL TARTRATE 25 MG PO TABS: 12.5000 mg | ORAL_TABLET | Freq: Two times a day (BID) | ORAL | 3 refills | Status: DC

## 2015-12-30 NOTE — Progress Notes (Signed)
Clinical Summary Mr. Folwell is a 82 y.o.male seen today for follow up of the following medical problems.   1. Afib - occasional palpitations - compliant with meds. No bleeding troubles on eliquis.    2. Tachy-brady syndrome - pacemaker followed by Dr Rayann Heman.  - normal evice check 10/2015, did have some high heart rates Past Medical History:  Diagnosis Date  . Chest pain    unspecified  . Chest pain, atypical   . Depression   . Depression   . Drug therapy    Coumadin Therapy  . Gastroesophageal reflux disease   . Gastroesophageal reflux disease   . Hypothyroidism   . Paroxysmal atrial fibrillation [facility]) May 2011  . Tachycardia-bradycardia syndrome ([place])    7 second pauses documented  . Transient ischemic attack    high risk for thromboembolic disease, secondary to atrial fibrillation, patient declines Coumadin due to prior history of severe epistaxis. Patient not willing to restart Coumadin therapy     Allergies  Allergen Reactions  . Warfarin Sodium     REACTION: bleeding     Current Outpatient Prescriptions  Medication Sig Dispense Refill  . ELIQUIS 5 MG TABS tablet take 1 tablet by mouth twice a day 30 tablet 0  . levothyroxine (SYNTHROID, LEVOTHROID) 25 MCG tablet Take 25 mcg by mouth daily.      . memantine (NAMENDA) 10 MG tablet Take 1 tablet by mouth 2 (two) times daily.  0  . Multiple Vitamins-Minerals (OCUVITE PRESERVISION) TABS Take 1 tablet by mouth 2 (two) times daily.      Marland Kitchen omeprazole (PRILOSEC) 40 MG capsule Take 1 capsule by mouth daily.  0  . simvastatin (ZOCOR) 20 MG tablet Take 20 mg by mouth at bedtime.      . tamsulosin (FLOMAX) 0.4 MG CAPS capsule Take 0.4 mg by mouth daily after supper.      No current facility-administered medications for this visit.      Past Surgical History:  Procedure Laterality Date  . ABDOMINAL SURGERY     Abdominal diverticultis  . PACEMAKER INSERTION  07/06/12   MDT Adapta L implanted by Dr Caryl Comes  .  PERMANENT PACEMAKER INSERTION N/A 07/06/2012   Procedure: PERMANENT PACEMAKER INSERTION;  Surgeon: Deboraha Sprang, MD;  Location: [facility] CATH LAB;  Service: Cardiovascular;  Laterality: N/A;     Allergies  Allergen Reactions  . Warfarin Sodium     REACTION: bleeding      Family History  Problem Relation Age of Onset  . Coronary artery disease    . Hyperlipidemia       Social History Mr. Paller reports that he quit smoking about 74 years ago. His smoking use included Cigarettes. He started smoking about 84 years ago. He has a 5.00 pack-year smoking history. He quit smokeless tobacco use about 10 years ago. His smokeless tobacco use included Chew. Mr. Mandella reports that he does not drink alcohol.   Review of Systems CONSTITUTIONAL: No weight loss, fever, chills, weakness or fatigue.  HEENT: Eyes: No visual loss, blurred vision, double vision or yellow sclerae.No hearing loss, sneezing, congestion, runny nose or sore throat.  SKIN: No rash or itching.  CARDIOVASCULAR: per HPI RESPIRATORY: No shortness of breath, cough or sputum.  GASTROINTESTINAL: No anorexia, nausea, vomiting or diarrhea. No abdominal pain or blood.  GENITOURINARY: No burning on urination, no polyuria NEUROLOGICAL: No headache, dizziness, syncope, paralysis, ataxia, numbness or tingling in the extremities. No change in bowel or bladder control.  MUSCULOSKELETAL: No muscle, back pain, joint pain or stiffness.  LYMPHATICS: No enlarged nodes. No history of splenectomy.  PSYCHIATRIC: No history of depression or anxiety.  ENDOCRINOLOGIC: No reports of sweating, cold or heat intolerance. No polyuria or polydipsia.  Marland Kitchen   Physical Examination Vitals:   12/30/15 0959  BP: 121/72  Pulse: 72   Vitals:   12/30/15 0959  Weight: 176 lb 9.6 oz (80.1 kg)  Height: 5\' 8"  (1.727 m)    Gen: resting comfortably, no acute distress HEENT: no scleral icterus, pupils equal round and reactive, no palptable cervical adenopathy,   CV: RRR, no m/r/v, no jvd Resp: Clear to auscultation bilaterally GI: abdomen is soft, non-tender, non-distended, normal bowel sounds, no hepatosplenomegaly MSK: extremities are warm, no edema.  Skin: warm, no rash Neuro:  no focal deficits Psych: appropriate affect   Diagnostic Studies 07/2013 Echo Study Conclusions  - Left ventricle: The cavity size was normal. Wall thickness was increased in a pattern of mild LVH. Systolic function was normal. The estimated ejection fraction was in the range of 55% to 60%. Wall motion was normal; there were no regional wall motion abnormalities. Doppler parameters are consistent with abnormal left ventricular relaxation (grade 1 diastolic dysfunction). - Aortic valve: Trileaflet; mildly thickened, mildly calcified leaflets. Trivial regurgitation. - Mitral valve: Calcified annulus. Mildly thickened leaflets . Trivial regurgitation. - Left atrium: The atrium was at the upper limits of normal in size. - Right ventricle: The cavity size was mildly dilated. Pacer wire or catheter noted in right ventricle. - Right atrium: Central venous pressure: 10mm Hg (est). - Tricuspid valve: Mild regurgitation. - Pulmonary arteries: PA peak pressure: 2mm Hg (S). - Pericardium, extracardiac: There was no pericardial effusion. Impressions:  - Mild LVH with LVEF 0000000, grade 1 diastolic dysfunction. Upper normal left atrial chamber size. MAC with trivial mitral regurgitation. Mildly sclerotic aortic valve with trivial aortic regurgitation. Mild RV enlargement, device wire noted. Mild tricuspid regurgitation with PASP 30 mm mercury.     Assessment and Plan   1. Afib - occasional palpitations, episodes of fatigue. Had some elevated HR episodes on last device check. Will try starting lopressor 12.5mg  bid.  - CHADS2Vasc score is 4, continue eliquis.   2. Tachy-brady syndrome - no current symptoms, normal device check  recently  - continue to monitor.     F/u 4 months. Request labs from pcp     Arnoldo Lenis, M.D.

## 2015-12-30 NOTE — Patient Instructions (Signed)
Your physician recommends that you schedule a follow-up appointment in: [place]. [place]   Your physician has recommended you make the following change in your medication:   START LOPRESSOR (METOPROLOL) 12.5 MG TWICE DAILY  Thank you for choosing [facility]!!

## 2016-01-02 DIAGNOSIS — R5383 Other fatigue: Secondary | ICD-10-CM | POA: Diagnosis not present

## 2016-01-02 DIAGNOSIS — Z6826 Body mass index (BMI) 26.0-26.9, adult: Secondary | ICD-10-CM | POA: Diagnosis not present

## 2016-01-02 DIAGNOSIS — F419 Anxiety disorder, unspecified: Secondary | ICD-10-CM | POA: Diagnosis not present

## 2016-01-02 DIAGNOSIS — F0631 Mood disorder due to known physiological condition with depressive features: Secondary | ICD-10-CM | POA: Diagnosis not present

## 2016-01-02 DIAGNOSIS — F0390 Unspecified dementia without behavioral disturbance: Secondary | ICD-10-CM | POA: Diagnosis not present

## 2016-01-19 ENCOUNTER — Telehealth: Payer: Self-pay | Admitting: Cardiology

## 2016-01-19 ENCOUNTER — Ambulatory Visit (INDEPENDENT_AMBULATORY_CARE_PROVIDER_SITE_OTHER): Payer: MEDICARE | Admitting: *Deleted

## 2016-01-19 DIAGNOSIS — I495 Sick sinus syndrome: Secondary | ICD-10-CM | POA: Diagnosis not present

## 2016-01-19 NOTE — Telephone Encounter (Signed)
Confirmed remote transmission w/ pt wife.   

## 2016-01-20 NOTE — Progress Notes (Signed)
Remote pacemaker transmission.   

## 2016-01-21 ENCOUNTER — Encounter: Payer: Self-pay | Admitting: Cardiology

## 2016-02-04 LAB — CUP PACEART REMOTE DEVICE CHECK
Battery Impedance: 178 Ohm
Battery Remaining Longevity: 124 mo
Battery Voltage: 2.79 V
Brady Statistic AP VP Percent: 0 %
Implantable Lead Implant Date: 20140424
Implantable Lead Location: 753859
Implantable Lead Model: 1948
Implantable Lead Model: 5076
Implantable Pulse Generator Implant Date: 20140424
Lead Channel Impedance Value: 554 Ohm
Lead Channel Pacing Threshold Amplitude: 0.5 V
Lead Channel Pacing Threshold Pulse Width: 0.4 ms
Lead Channel Setting Pacing Amplitude: 2 V
Lead Channel Setting Pacing Amplitude: 2.5 V
Lead Channel Setting Pacing Pulse Width: 0.4 ms
MDC IDC LEAD IMPLANT DT: 20140424
MDC IDC LEAD LOCATION: 753860
MDC IDC MSMT LEADCHNL RA PACING THRESHOLD PULSEWIDTH: 0.4 ms
MDC IDC MSMT LEADCHNL RV IMPEDANCE VALUE: 781 Ohm
MDC IDC MSMT LEADCHNL RV PACING THRESHOLD AMPLITUDE: 0.375 V
MDC IDC MSMT LEADCHNL RV SENSING INTR AMPL: 11.2 mV
MDC IDC SESS DTM: 20171106201710
MDC IDC SET LEADCHNL RV SENSING SENSITIVITY: 4 mV
MDC IDC STAT BRADY AP VS PERCENT: 91 %
MDC IDC STAT BRADY AS VP PERCENT: 0 %
MDC IDC STAT BRADY AS VS PERCENT: 8 %

## 2016-02-09 DIAGNOSIS — D649 Anemia, unspecified: Secondary | ICD-10-CM | POA: Diagnosis not present

## 2016-02-09 DIAGNOSIS — I739 Peripheral vascular disease, unspecified: Secondary | ICD-10-CM | POA: Diagnosis not present

## 2016-02-09 DIAGNOSIS — R5383 Other fatigue: Secondary | ICD-10-CM | POA: Diagnosis not present

## 2016-02-09 DIAGNOSIS — L609 Nail disorder, unspecified: Secondary | ICD-10-CM | POA: Diagnosis not present

## 2016-02-09 DIAGNOSIS — L110 Acquired keratosis follicularis: Secondary | ICD-10-CM | POA: Diagnosis not present

## 2016-02-09 DIAGNOSIS — E039 Hypothyroidism, unspecified: Secondary | ICD-10-CM | POA: Diagnosis not present

## 2016-02-09 DIAGNOSIS — N183 Chronic kidney disease, stage 3 (moderate): Secondary | ICD-10-CM | POA: Diagnosis not present

## 2016-02-09 DIAGNOSIS — K219 Gastro-esophageal reflux disease without esophagitis: Secondary | ICD-10-CM | POA: Diagnosis not present

## 2016-02-16 DIAGNOSIS — N183 Chronic kidney disease, stage 3 (moderate): Secondary | ICD-10-CM | POA: Diagnosis not present

## 2016-02-16 DIAGNOSIS — D649 Anemia, unspecified: Secondary | ICD-10-CM | POA: Diagnosis not present

## 2016-02-16 DIAGNOSIS — K219 Gastro-esophageal reflux disease without esophagitis: Secondary | ICD-10-CM | POA: Diagnosis not present

## 2016-02-16 DIAGNOSIS — F419 Anxiety disorder, unspecified: Secondary | ICD-10-CM | POA: Diagnosis not present

## 2016-02-16 DIAGNOSIS — I482 Chronic atrial fibrillation: Secondary | ICD-10-CM | POA: Diagnosis not present

## 2016-02-16 DIAGNOSIS — G629 Polyneuropathy, unspecified: Secondary | ICD-10-CM | POA: Diagnosis not present

## 2016-02-16 DIAGNOSIS — E039 Hypothyroidism, unspecified: Secondary | ICD-10-CM | POA: Diagnosis not present

## 2016-02-16 DIAGNOSIS — F0631 Mood disorder due to known physiological condition with depressive features: Secondary | ICD-10-CM | POA: Diagnosis not present

## 2016-04-19 ENCOUNTER — Encounter: Payer: MEDICARE | Admitting: *Deleted

## 2016-04-19 ENCOUNTER — Telehealth: Payer: Self-pay | Admitting: Cardiology

## 2016-04-19 NOTE — Telephone Encounter (Signed)
Spoke with pt and reminded pt of remote transmission that is due today. Pt verbalized understanding.   

## 2016-04-23 ENCOUNTER — Encounter: Payer: Self-pay | Admitting: Cardiology

## 2016-05-03 ENCOUNTER — Encounter: Payer: Self-pay | Admitting: Cardiology

## 2016-05-03 ENCOUNTER — Ambulatory Visit (INDEPENDENT_AMBULATORY_CARE_PROVIDER_SITE_OTHER): Payer: MEDICARE | Admitting: Cardiology

## 2016-05-03 VITALS — BP 117/75 | HR 73 | Ht 68.0 in | Wt 181.8 lb

## 2016-05-03 DIAGNOSIS — I4891 Unspecified atrial fibrillation: Secondary | ICD-10-CM | POA: Diagnosis not present

## 2016-05-03 DIAGNOSIS — I495 Sick sinus syndrome: Secondary | ICD-10-CM | POA: Diagnosis not present

## 2016-05-03 NOTE — Patient Instructions (Signed)

## 2016-05-03 NOTE — Progress Notes (Signed)
Clinical Summary Mr. Coger is a 83 y.o.male seen today for follow up of the following medical problems.   1. Afib - last visit he had some elevated heart rates on recent device check. He reported occasional palpitations, fatigue. We started lopressor 12.48m bid.  - denies any recent palpitations since med change.   2. Tachy-brady syndrome - pacemaker followed by Dr ARayann Heman  - normal evice check 01/2016.   3.Hypothyroidism - TSH 10.4 back in 10/2015 - followed by pcp  Past Medical History:  Diagnosis Date  . Chest pain    unspecified  . Chest pain, atypical   . Depression   . Depression   . Drug therapy    Coumadin Therapy  . Gastroesophageal reflux disease   . Gastroesophageal reflux disease   . Hypothyroidism   . Paroxysmal atrial fibrillation ([facility] May 2011  . [place]    7 second pauses documented  . Transient ischemic attack    high risk for thromboembolic disease, secondary to atrial fibrillation, patient declines Coumadin due to prior history of severe epistaxis. Patient not willing to restart Coumadin therapy     Allergies  Allergen Reactions  . Warfarin Sodium     REACTION: bleeding     Current Outpatient Prescriptions  Medication Sig Dispense Refill  . apixaban (ELIQUIS) 5 MG TABS tablet Take 1 tablet (5 mg total) by mouth 2 (two) times daily. 180 tablet 3  . levothyroxine (SYNTHROID, LEVOTHROID) 25 MCG tablet Take 25 mcg by mouth daily.      . memantine (NAMENDA) 10 MG tablet Take 1 tablet by mouth 2 (two) times daily.  0  . metoprolol tartrate (LOPRESSOR) 25 MG tablet Take 0.5 tablets (12.5 mg total) by mouth 2 (two) times daily. 90 tablet 3  . Multiple Vitamins-Minerals (OCUVITE PRESERVISION) TABS Take 1 tablet by mouth 2 (two) times daily.      .Marland Kitchenomeprazole (PRILOSEC) 40 MG capsule Take 1 capsule by mouth daily.  0  . simvastatin (ZOCOR) 20 MG tablet Take 20 mg by mouth at bedtime.      . tamsulosin (FLOMAX)  0.4 MG CAPS capsule Take 0.4 mg by mouth daily after supper.      No current facility-administered medications for this visit.      Past Surgical History:  Procedure Laterality Date  . ABDOMINAL SURGERY     Abdominal diverticultis  . PACEMAKER INSERTION  07/06/12   MDT Adapta L implanted by Dr KCaryl Comes . PERMANENT PACEMAKER INSERTION N/A 07/06/2012   Procedure: PERMANENT PACEMAKER INSERTION;  Surgeon: SDeboraha Sprang MD;  [facility];  Service: Cardiovascular;  Laterality: N/A;     Allergies  Allergen Reactions  . Warfarin Sodium     REACTION: bleeding      Family History  Problem Relation Age of Onset  . Coronary artery disease    . Hyperlipidemia       Social History Mr. WHeemstrareports that he quit smoking about 74 years ago. His smoking use included Cigarettes. He started smoking about 84 years ago. He has a 5.00 pack-year smoking history. He quit smokeless tobacco use about 11 years ago. His smokeless tobacco use included Chew. Mr. WHollimanreports that he does not drink alcohol.   Review of Systems CONSTITUTIONAL: No weight loss, fever, chills, weakness or fatigue.  HEENT: Eyes: No visual loss, blurred vision, double vision or yellow sclerae.No hearing loss, sneezing, congestion, runny nose or sore throat.  SKIN: No rash or  itching.  CARDIOVASCULAR: per HPI RESPIRATORY: No shortness of breath, cough or sputum.  GASTROINTESTINAL: No anorexia, nausea, vomiting or diarrhea. No abdominal pain or blood.  GENITOURINARY: No burning on urination, no polyuria NEUROLOGICAL: No headache, dizziness, syncope, paralysis, ataxia, numbness or tingling in the extremities. No change in bowel or bladder control.  MUSCULOSKELETAL: No muscle, back pain, joint pain or stiffness.  LYMPHATICS: No enlarged nodes. No history of splenectomy.  PSYCHIATRIC: No history of depression or anxiety.  ENDOCRINOLOGIC: No reports of sweating, cold or heat intolerance. No polyuria or polydipsia.   Marland Kitchen   Physical Examination Vitals:   05/03/16 0948  BP: 117/75  Pulse: 73   Vitals:   05/03/16 0948  Weight: 181 lb 12.8 oz (82.5 kg)  Height: _0  (1.727 m)    Gen: resting comfortably, no acute distress HEENT: no scleral icterus, pupils equal round and reactive, no palptable cervical adenopathy,  CV: RRR, 2/6 systolic murmur rusb, no jvd Resp: Clear to auscultation bilaterally GI: abdomen is soft, non-tender, non-distended, normal bowel sounds, no hepatosplenomegaly MSK: extremities are warm, no edema.  Skin: warm, no rash Neuro:  no focal deficits Psych: appropriate affect   Diagnostic Studies 07/2013 Echo Study Conclusions  - Left ventricle: The cavity size was normal. Wall thickness was increased in a pattern of mild LVH. Systolic function was normal. The estimated ejection fraction was in the range of 55% to 60%. Wall motion was normal; there were no regional wall motion abnormalities. Doppler parameters are consistent with abnormal left ventricular relaxation (grade 1 diastolic dysfunction). - Aortic valve: Trileaflet; mildly thickened, mildly calcified leaflets. Trivial regurgitation. - Mitral valve: Calcified annulus. Mildly thickened leaflets . Trivial regurgitation. - Left atrium: The atrium was at the upper limits of normal in size. - Right ventricle: The cavity size was mildly dilated. Pacer wire or catheter noted in right ventricle. - Right atrium: Central venous pressure: 75m Hg (est). - Tricuspid valve: Mild regurgitation. - Pulmonary arteries: PA peak pressure: 37mHg (S). - Pericardium, extracardiac: There was no pericardial effusion. Impressions:  - Mild LVH with LVEF 5514-48%grade 1 diastolic dysfunction. Upper normal left atrial chamber size. MAC with trivial mitral regurgitation. Mildly sclerotic aortic valve with trivial aortic regurgitation. Mild RV enlargement, device wire noted. Mild tricuspid  regurgitation with PASP 30 mm mercury.      Assessment and Plan  1. Afib - palpitations resolved with lopressor, continue current meds - CHADS2Vasc score is 4, continue eliquis.   2. Tachy-brady syndrome - no current symptoms - continue to monitor pacemaker   F/u 6 months      JoArnoldo LenisM.D

## 2016-05-18 DIAGNOSIS — E039 Hypothyroidism, unspecified: Secondary | ICD-10-CM | POA: Diagnosis not present

## 2016-05-18 DIAGNOSIS — N183 Chronic kidney disease, stage 3 (moderate): Secondary | ICD-10-CM | POA: Diagnosis not present

## 2016-05-18 DIAGNOSIS — D649 Anemia, unspecified: Secondary | ICD-10-CM | POA: Diagnosis not present

## 2016-05-21 DIAGNOSIS — I495 Sick sinus syndrome: Secondary | ICD-10-CM | POA: Diagnosis not present

## 2016-05-21 DIAGNOSIS — G629 Polyneuropathy, unspecified: Secondary | ICD-10-CM | POA: Diagnosis not present

## 2016-05-21 DIAGNOSIS — I482 Chronic atrial fibrillation: Secondary | ICD-10-CM | POA: Diagnosis not present

## 2016-05-21 DIAGNOSIS — D649 Anemia, unspecified: Secondary | ICD-10-CM | POA: Diagnosis not present

## 2016-05-21 DIAGNOSIS — H6122 Impacted cerumen, left ear: Secondary | ICD-10-CM | POA: Diagnosis not present

## 2016-05-21 DIAGNOSIS — E039 Hypothyroidism, unspecified: Secondary | ICD-10-CM | POA: Diagnosis not present

## 2016-05-21 DIAGNOSIS — F419 Anxiety disorder, unspecified: Secondary | ICD-10-CM | POA: Diagnosis not present

## 2016-05-21 DIAGNOSIS — F0390 Unspecified dementia without behavioral disturbance: Secondary | ICD-10-CM | POA: Diagnosis not present

## 2016-06-09 DIAGNOSIS — K219 Gastro-esophageal reflux disease without esophagitis: Secondary | ICD-10-CM | POA: Diagnosis not present

## 2016-07-05 DIAGNOSIS — I739 Peripheral vascular disease, unspecified: Secondary | ICD-10-CM | POA: Diagnosis not present

## 2016-07-05 DIAGNOSIS — L609 Nail disorder, unspecified: Secondary | ICD-10-CM | POA: Diagnosis not present

## 2016-07-05 DIAGNOSIS — L110 Acquired keratosis follicularis: Secondary | ICD-10-CM | POA: Diagnosis not present

## 2016-07-06 ENCOUNTER — Ambulatory Visit (INDEPENDENT_AMBULATORY_CARE_PROVIDER_SITE_OTHER): Payer: MEDICARE | Admitting: Internal Medicine

## 2016-07-06 ENCOUNTER — Encounter: Payer: Self-pay | Admitting: *Deleted

## 2016-07-06 VITALS — BP 102/66 | HR 74 | Ht 68.0 in | Wt 180.0 lb

## 2016-07-06 DIAGNOSIS — I495 Sick sinus syndrome: Secondary | ICD-10-CM

## 2016-07-06 DIAGNOSIS — I480 Paroxysmal atrial fibrillation: Secondary | ICD-10-CM | POA: Diagnosis not present

## 2016-07-06 NOTE — Patient Instructions (Signed)
Medication Instructions:  Continue all current medications.  Labwork: none  Testing/Procedures: none  Follow-Up: Your physician wants you to follow up in:  1 year.  You will receive a reminder letter in the mail one-two months in advance.  If you don't receive a letter, please call our office to schedule the follow up appointment - Dr. Allred.   Any Other Special Instructions Will Be Listed Below (If Applicable). Remote monitoring is used to monitor your Pacemaker of ICD from home. This monitoring reduces the number of office visits required to check your device to one time per year. It allows us to keep an eye on the functioning of your device to ensure it is working properly. You are scheduled for a device check from home on 10/05/2016.  You may send your transmission at any time that day. If you have a wireless device, the transmission will be sent automatically. After your physician reviews your transmission, you will receive a postcard with your next transmission date.  If you need a refill on your cardiac medications before your next appointment, please call your pharmacy.  

## 2016-07-06 NOTE — Progress Notes (Signed)
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 male who presents today for routine electrophysiology followup.  He has chronic difficulty with weakness.  Today, he denies symptoms of palpitations, chest pain, shortness of breath,  lower extremity edema, dizziness, presyncope, or syncope.  The patient is otherwise without complaint today.  His fatigue and depression seems stable.  Past Medical History:  Diagnosis Date  . Chest pain    unspecified  . Chest pain, atypical   . Depression   . Depression   . Drug therapy    Coumadin Therapy  . Gastroesophageal reflux disease   . Gastroesophageal reflux disease   . Hypothyroidism   . Paroxysmal atrial fibrillation [facility]) May 2011  . Tachycardia-bradycardia syndrome ([place])    7 second pauses documented  . Transient ischemic attack    high risk for thromboembolic disease, secondary to atrial fibrillation, patient declines Coumadin due to prior history of severe epistaxis. Patient not willing to restart Coumadin therapy   Past Surgical History:  Procedure Laterality Date  . ABDOMINAL SURGERY     Abdominal diverticultis  . PACEMAKER INSERTION  07/06/12   MDT Adapta L implanted by Dr Caryl Comes  . PERMANENT PACEMAKER INSERTION N/A 07/06/2012   Procedure: PERMANENT PACEMAKER INSERTION;  Surgeon: Deboraha Sprang, MD;  Location: [facility] CATH LAB;  Service: Cardiovascular;  Laterality: N/A;    Current Outpatient Prescriptions  Medication Sig Dispense Refill  . ALPRAZolam (XANAX) 0.25 MG tablet Take 1 tablet by mouth daily as needed.    Marland Kitchen apixaban (ELIQUIS) 5 MG TABS tablet Take 1 tablet (5 mg total) by mouth 2 (two) times daily. 180 tablet 3  . levothyroxine (SYNTHROID, LEVOTHROID) 75 MCG tablet Take 1 tablet by mouth daily.    . memantine (NAMENDA) 10 MG tablet Take 1 tablet by mouth 2 (two) times daily.  0  . metoprolol tartrate (LOPRESSOR) 25 MG tablet Take 0.5 tablets (12.5 mg total) by mouth 2  (two) times daily. 90 tablet 3  . Multiple Vitamins-Minerals (OCUVITE PRESERVISION) TABS Take 1 tablet by mouth 2 (two) times daily.      . pantoprazole (PROTONIX) 40 MG tablet Take 1 tablet by mouth daily.    . simvastatin (ZOCOR) 20 MG tablet Take 20 mg by mouth at bedtime.      . tamsulosin (FLOMAX) 0.4 MG CAPS capsule Take 0.4 mg by mouth daily after supper.      No current facility-administered medications for this visit.     Physical Exam: Vitals:   07/06/16 0815  BP: 102/66  Pulse: 74  Weight: 180 lb (81.6 kg)  Height: 5\' 8"  (1.727 m)    GEN- The patient is elderly appearing, alert and oriented x 3 today.   Head- normocephalic, atraumatic Eyes-  Sclera clear, conjunctiva pink, poor vision Ears- hearing is decreased Oropharynx- clear Lungs- Clear to ausculation bilaterally, normal work of breathing Chest- pacemaker pocket is well healed Heart- Regular rate and rhythm, no murmurs, rubs or gallops  GI- soft, NT, ND, + BS Extremities- no clubbing, cyanosis, or edema  Pacemaker interrogation- personally reviewed in detail today,  See PACEART report  Assessment and Plan:  1. Symptomatic Bradycardia/ tachybrady syndrome Normal pacemaker function See Pace Art report No changes today  2. Paroxysmal afib afib burden 3% by PPM interrogation today (unchanged from last year) Continue eliquis.  PCP is following BMET and CBC .  Would reduce to 2.5mg  BID if creatinine is >1.5 (  last labs that I have access to reveal Creat of 1.4)    [facility] Return to see me in 1 year  Thompson Grayer MD, [facility] 07/06/2016 8:32 AM

## 2016-07-12 LAB — CUP PACEART INCLINIC DEVICE CHECK
Brady Statistic AP VP Percent: 0.2 %
Brady Statistic AS VP Percent: 0.1 %
Date Time Interrogation Session: 20180430085214
Implantable Lead Implant Date: 20140424
Implantable Lead Location: 753860
Implantable Pulse Generator Implant Date: 20140424
Lead Channel Pacing Threshold Amplitude: 0.5 V
Lead Channel Pacing Threshold Pulse Width: 0.4 ms
Lead Channel Pacing Threshold Pulse Width: 0.4 ms
Lead Channel Sensing Intrinsic Amplitude: 11.2 mV
MDC IDC LEAD IMPLANT DT: 20140424
MDC IDC LEAD LOCATION: 753859
MDC IDC MSMT LEADCHNL RA IMPEDANCE VALUE: 601 Ohm
MDC IDC MSMT LEADCHNL RA PACING THRESHOLD AMPLITUDE: 0.75 V
MDC IDC MSMT LEADCHNL RA SENSING INTR AMPL: 1 mV
MDC IDC MSMT LEADCHNL RV IMPEDANCE VALUE: 748 Ohm
MDC IDC STAT BRADY AP VS PERCENT: 93.6 %
MDC IDC STAT BRADY AS VS PERCENT: 6.2 %

## 2016-07-16 ENCOUNTER — Encounter: Payer: Self-pay | Admitting: Internal Medicine

## 2016-07-19 ENCOUNTER — Other Ambulatory Visit: Payer: Self-pay | Admitting: Internal Medicine

## 2016-07-20 DIAGNOSIS — R5383 Other fatigue: Secondary | ICD-10-CM | POA: Diagnosis not present

## 2016-07-20 DIAGNOSIS — Z6826 Body mass index (BMI) 26.0-26.9, adult: Secondary | ICD-10-CM | POA: Diagnosis not present

## 2016-07-20 DIAGNOSIS — J019 Acute sinusitis, unspecified: Secondary | ICD-10-CM | POA: Diagnosis not present

## 2016-08-13 DIAGNOSIS — I482 Chronic atrial fibrillation: Secondary | ICD-10-CM | POA: Diagnosis not present

## 2016-08-13 DIAGNOSIS — K219 Gastro-esophageal reflux disease without esophagitis: Secondary | ICD-10-CM | POA: Diagnosis not present

## 2016-08-13 DIAGNOSIS — D649 Anemia, unspecified: Secondary | ICD-10-CM | POA: Diagnosis not present

## 2016-08-13 DIAGNOSIS — N183 Chronic kidney disease, stage 3 (moderate): Secondary | ICD-10-CM | POA: Diagnosis not present

## 2016-08-13 DIAGNOSIS — E039 Hypothyroidism, unspecified: Secondary | ICD-10-CM | POA: Diagnosis not present

## 2016-08-13 DIAGNOSIS — E871 Hypo-osmolality and hyponatremia: Secondary | ICD-10-CM | POA: Diagnosis not present

## 2016-08-13 DIAGNOSIS — F0390 Unspecified dementia without behavioral disturbance: Secondary | ICD-10-CM | POA: Diagnosis not present

## 2016-08-17 DIAGNOSIS — Z6825 Body mass index (BMI) 25.0-25.9, adult: Secondary | ICD-10-CM | POA: Diagnosis not present

## 2016-08-17 DIAGNOSIS — F0390 Unspecified dementia without behavioral disturbance: Secondary | ICD-10-CM | POA: Diagnosis not present

## 2016-08-17 DIAGNOSIS — E039 Hypothyroidism, unspecified: Secondary | ICD-10-CM | POA: Diagnosis not present

## 2016-08-17 DIAGNOSIS — M1712 Unilateral primary osteoarthritis, left knee: Secondary | ICD-10-CM | POA: Diagnosis not present

## 2016-08-17 DIAGNOSIS — E871 Hypo-osmolality and hyponatremia: Secondary | ICD-10-CM | POA: Diagnosis not present

## 2016-08-17 DIAGNOSIS — I482 Chronic atrial fibrillation: Secondary | ICD-10-CM | POA: Diagnosis not present

## 2016-08-17 DIAGNOSIS — D649 Anemia, unspecified: Secondary | ICD-10-CM | POA: Diagnosis not present

## 2016-08-17 DIAGNOSIS — G629 Polyneuropathy, unspecified: Secondary | ICD-10-CM | POA: Diagnosis not present

## 2016-09-17 DIAGNOSIS — Z6825 Body mass index (BMI) 25.0-25.9, adult: Secondary | ICD-10-CM | POA: Diagnosis not present

## 2016-09-17 DIAGNOSIS — N39490 Overflow incontinence: Secondary | ICD-10-CM | POA: Diagnosis not present

## 2016-09-17 DIAGNOSIS — N401 Enlarged prostate with lower urinary tract symptoms: Secondary | ICD-10-CM | POA: Diagnosis not present

## 2016-09-27 DIAGNOSIS — L110 Acquired keratosis follicularis: Secondary | ICD-10-CM | POA: Diagnosis not present

## 2016-09-27 DIAGNOSIS — L609 Nail disorder, unspecified: Secondary | ICD-10-CM | POA: Diagnosis not present

## 2016-09-27 DIAGNOSIS — I739 Peripheral vascular disease, unspecified: Secondary | ICD-10-CM | POA: Diagnosis not present

## 2016-10-05 ENCOUNTER — Telehealth: Payer: Self-pay | Admitting: Cardiology

## 2016-10-05 ENCOUNTER — Ambulatory Visit (INDEPENDENT_AMBULATORY_CARE_PROVIDER_SITE_OTHER): Payer: MEDICARE | Admitting: *Deleted

## 2016-10-05 DIAGNOSIS — I495 Sick sinus syndrome: Secondary | ICD-10-CM | POA: Diagnosis not present

## 2016-10-05 NOTE — Telephone Encounter (Signed)
Confirmed remote transmission w/ pt wife.   

## 2016-10-06 NOTE — Progress Notes (Signed)
Remote pacemaker transmission.   

## 2016-10-07 ENCOUNTER — Encounter: Payer: Self-pay | Admitting: Cardiology

## 2016-10-25 DIAGNOSIS — H04123 Dry eye syndrome of bilateral lacrimal glands: Secondary | ICD-10-CM | POA: Diagnosis not present

## 2016-10-28 DIAGNOSIS — Z79899 Other long term (current) drug therapy: Secondary | ICD-10-CM | POA: Diagnosis not present

## 2016-10-28 DIAGNOSIS — I700 Atherosclerosis of aorta: Secondary | ICD-10-CM | POA: Diagnosis not present

## 2016-10-28 DIAGNOSIS — E7800 Pure hypercholesterolemia, unspecified: Secondary | ICD-10-CM | POA: Diagnosis not present

## 2016-10-28 DIAGNOSIS — Z7902 Long term (current) use of antithrombotics/antiplatelets: Secondary | ICD-10-CM | POA: Diagnosis not present

## 2016-10-28 DIAGNOSIS — T18108A Unspecified foreign body in esophagus causing other injury, initial encounter: Secondary | ICD-10-CM | POA: Diagnosis not present

## 2016-10-28 DIAGNOSIS — S199XXA Unspecified injury of neck, initial encounter: Secondary | ICD-10-CM | POA: Diagnosis not present

## 2016-10-28 DIAGNOSIS — K219 Gastro-esophageal reflux disease without esophagitis: Secondary | ICD-10-CM | POA: Diagnosis not present

## 2016-10-28 DIAGNOSIS — T17320A Food in larynx causing asphyxiation, initial encounter: Secondary | ICD-10-CM | POA: Diagnosis not present

## 2016-10-28 DIAGNOSIS — I4891 Unspecified atrial fibrillation: Secondary | ICD-10-CM | POA: Diagnosis not present

## 2016-11-02 LAB — CUP PACEART REMOTE DEVICE CHECK
Battery Impedance: 225 Ohm
Battery Remaining Longevity: 116 mo
Brady Statistic AS VS Percent: 5 %
Implantable Lead Implant Date: 20140424
Implantable Lead Location: 753859
Implantable Lead Model: 1948
Implantable Lead Model: 5076
Implantable Pulse Generator Implant Date: 20140424
Lead Channel Impedance Value: 556 Ohm
Lead Channel Impedance Value: 800 Ohm
Lead Channel Pacing Threshold Amplitude: 0.5 V
Lead Channel Setting Pacing Amplitude: 2.5 V
MDC IDC LEAD IMPLANT DT: 20140424
MDC IDC LEAD LOCATION: 753860
MDC IDC MSMT BATTERY VOLTAGE: 2.8 V
MDC IDC MSMT LEADCHNL RA PACING THRESHOLD PULSEWIDTH: 0.4 ms
MDC IDC MSMT LEADCHNL RV PACING THRESHOLD AMPLITUDE: 0.375 V
MDC IDC MSMT LEADCHNL RV PACING THRESHOLD PULSEWIDTH: 0.4 ms
MDC IDC SESS DTM: 20180724170546
MDC IDC SET LEADCHNL RA PACING AMPLITUDE: 2 V
MDC IDC SET LEADCHNL RV PACING PULSEWIDTH: 0.4 ms
MDC IDC SET LEADCHNL RV SENSING SENSITIVITY: 4 mV
MDC IDC STAT BRADY AP VP PERCENT: 0 %
MDC IDC STAT BRADY AP VS PERCENT: 95 %
MDC IDC STAT BRADY AS VP PERCENT: 0 %

## 2016-11-16 DIAGNOSIS — E871 Hypo-osmolality and hyponatremia: Secondary | ICD-10-CM | POA: Diagnosis not present

## 2016-11-18 DIAGNOSIS — I495 Sick sinus syndrome: Secondary | ICD-10-CM | POA: Diagnosis not present

## 2016-11-18 DIAGNOSIS — E039 Hypothyroidism, unspecified: Secondary | ICD-10-CM | POA: Diagnosis not present

## 2016-11-18 DIAGNOSIS — D649 Anemia, unspecified: Secondary | ICD-10-CM | POA: Diagnosis not present

## 2016-11-18 DIAGNOSIS — F0390 Unspecified dementia without behavioral disturbance: Secondary | ICD-10-CM | POA: Diagnosis not present

## 2016-11-18 DIAGNOSIS — Z6825 Body mass index (BMI) 25.0-25.9, adult: Secondary | ICD-10-CM | POA: Diagnosis not present

## 2016-11-18 DIAGNOSIS — F0631 Mood disorder due to known physiological condition with depressive features: Secondary | ICD-10-CM | POA: Diagnosis not present

## 2016-11-18 DIAGNOSIS — E871 Hypo-osmolality and hyponatremia: Secondary | ICD-10-CM | POA: Diagnosis not present

## 2016-11-18 DIAGNOSIS — I482 Chronic atrial fibrillation: Secondary | ICD-10-CM | POA: Diagnosis not present

## 2016-12-13 DIAGNOSIS — L110 Acquired keratosis follicularis: Secondary | ICD-10-CM | POA: Diagnosis not present

## 2016-12-13 DIAGNOSIS — L609 Nail disorder, unspecified: Secondary | ICD-10-CM | POA: Diagnosis not present

## 2016-12-13 DIAGNOSIS — I739 Peripheral vascular disease, unspecified: Secondary | ICD-10-CM | POA: Diagnosis not present

## 2016-12-20 ENCOUNTER — Other Ambulatory Visit: Payer: Self-pay | Admitting: Cardiology

## 2016-12-28 ENCOUNTER — Other Ambulatory Visit: Payer: Self-pay | Admitting: Cardiology

## 2017-01-04 ENCOUNTER — Encounter: Payer: MEDICARE | Admitting: *Deleted

## 2017-01-06 ENCOUNTER — Encounter: Payer: Self-pay | Admitting: Cardiology

## 2017-01-31 ENCOUNTER — Other Ambulatory Visit: Payer: Self-pay | Admitting: *Deleted

## 2017-01-31 MED ORDER — METOPROLOL TARTRATE 25 MG PO TABS: 12.5000 mg | ORAL_TABLET | Freq: Two times a day (BID) | ORAL | 0 refills | Status: DC

## 2017-02-09 DIAGNOSIS — K219 Gastro-esophageal reflux disease without esophagitis: Secondary | ICD-10-CM | POA: Diagnosis not present

## 2017-02-09 DIAGNOSIS — I482 Chronic atrial fibrillation: Secondary | ICD-10-CM | POA: Diagnosis not present

## 2017-02-09 DIAGNOSIS — E871 Hypo-osmolality and hyponatremia: Secondary | ICD-10-CM | POA: Diagnosis not present

## 2017-02-09 DIAGNOSIS — D649 Anemia, unspecified: Secondary | ICD-10-CM | POA: Diagnosis not present

## 2017-02-09 DIAGNOSIS — N183 Chronic kidney disease, stage 3 (moderate): Secondary | ICD-10-CM | POA: Diagnosis not present

## 2017-02-09 DIAGNOSIS — Z950 Presence of cardiac pacemaker: Secondary | ICD-10-CM | POA: Diagnosis not present

## 2017-02-09 DIAGNOSIS — E039 Hypothyroidism, unspecified: Secondary | ICD-10-CM | POA: Diagnosis not present

## 2017-02-15 DIAGNOSIS — Z0001 Encounter for general adult medical examination with abnormal findings: Secondary | ICD-10-CM | POA: Diagnosis not present

## 2017-02-15 DIAGNOSIS — N183 Chronic kidney disease, stage 3 (moderate): Secondary | ICD-10-CM | POA: Diagnosis not present

## 2017-02-15 DIAGNOSIS — I495 Sick sinus syndrome: Secondary | ICD-10-CM | POA: Diagnosis not present

## 2017-02-15 DIAGNOSIS — K219 Gastro-esophageal reflux disease without esophagitis: Secondary | ICD-10-CM | POA: Diagnosis not present

## 2017-02-15 DIAGNOSIS — F419 Anxiety disorder, unspecified: Secondary | ICD-10-CM | POA: Diagnosis not present

## 2017-02-15 DIAGNOSIS — D649 Anemia, unspecified: Secondary | ICD-10-CM | POA: Diagnosis not present

## 2017-02-15 DIAGNOSIS — E039 Hypothyroidism, unspecified: Secondary | ICD-10-CM | POA: Diagnosis not present

## 2017-02-15 DIAGNOSIS — M1712 Unilateral primary osteoarthritis, left knee: Secondary | ICD-10-CM | POA: Diagnosis not present

## 2017-02-15 DIAGNOSIS — F0390 Unspecified dementia without behavioral disturbance: Secondary | ICD-10-CM | POA: Diagnosis not present

## 2017-02-15 DIAGNOSIS — F0631 Mood disorder due to known physiological condition with depressive features: Secondary | ICD-10-CM | POA: Diagnosis not present

## 2017-02-15 DIAGNOSIS — I482 Chronic atrial fibrillation: Secondary | ICD-10-CM | POA: Diagnosis not present

## 2017-02-15 DIAGNOSIS — E871 Hypo-osmolality and hyponatremia: Secondary | ICD-10-CM | POA: Diagnosis not present

## 2017-02-28 DIAGNOSIS — L110 Acquired keratosis follicularis: Secondary | ICD-10-CM | POA: Diagnosis not present

## 2017-02-28 DIAGNOSIS — I739 Peripheral vascular disease, unspecified: Secondary | ICD-10-CM | POA: Diagnosis not present

## 2017-02-28 DIAGNOSIS — L609 Nail disorder, unspecified: Secondary | ICD-10-CM | POA: Diagnosis not present

## 2017-05-04 DIAGNOSIS — F0631 Mood disorder due to known physiological condition with depressive features: Secondary | ICD-10-CM | POA: Diagnosis not present

## 2017-05-04 DIAGNOSIS — I482 Chronic atrial fibrillation: Secondary | ICD-10-CM | POA: Diagnosis not present

## 2017-05-04 DIAGNOSIS — J019 Acute sinusitis, unspecified: Secondary | ICD-10-CM | POA: Diagnosis not present

## 2017-05-04 DIAGNOSIS — Z6825 Body mass index (BMI) 25.0-25.9, adult: Secondary | ICD-10-CM | POA: Diagnosis not present

## 2017-06-06 DIAGNOSIS — I739 Peripheral vascular disease, unspecified: Secondary | ICD-10-CM | POA: Diagnosis not present

## 2017-06-06 DIAGNOSIS — L609 Nail disorder, unspecified: Secondary | ICD-10-CM | POA: Diagnosis not present

## 2017-06-06 DIAGNOSIS — L110 Acquired keratosis follicularis: Secondary | ICD-10-CM | POA: Diagnosis not present

## 2017-07-14 ENCOUNTER — Encounter: Payer: Self-pay | Admitting: *Deleted

## 2017-07-15 ENCOUNTER — Encounter: Payer: Medicare Other | Admitting: Internal Medicine

## 2017-08-14 DIAGNOSIS — R109 Unspecified abdominal pain: Secondary | ICD-10-CM | POA: Diagnosis not present

## 2017-08-14 DIAGNOSIS — R0902 Hypoxemia: Secondary | ICD-10-CM | POA: Diagnosis not present

## 2017-08-14 DIAGNOSIS — R112 Nausea with vomiting, unspecified: Secondary | ICD-10-CM | POA: Diagnosis not present

## 2017-08-14 DIAGNOSIS — K5900 Constipation, unspecified: Secondary | ICD-10-CM | POA: Diagnosis not present

## 2017-08-14 DIAGNOSIS — Z66 Do not resuscitate: Secondary | ICD-10-CM | POA: Diagnosis not present

## 2017-08-14 DIAGNOSIS — R197 Diarrhea, unspecified: Secondary | ICD-10-CM | POA: Diagnosis not present

## 2017-08-14 DIAGNOSIS — J189 Pneumonia, unspecified organism: Secondary | ICD-10-CM | POA: Diagnosis not present

## 2017-08-14 DIAGNOSIS — N179 Acute kidney failure, unspecified: Secondary | ICD-10-CM | POA: Diagnosis not present

## 2017-08-14 DIAGNOSIS — K811 Chronic cholecystitis: Secondary | ICD-10-CM | POA: Diagnosis not present

## 2017-08-14 DIAGNOSIS — K7200 Acute and subacute hepatic failure without coma: Secondary | ICD-10-CM | POA: Diagnosis not present

## 2017-08-14 DIAGNOSIS — K8309 Other cholangitis: Secondary | ICD-10-CM | POA: Diagnosis not present

## 2017-08-14 DIAGNOSIS — I509 Heart failure, unspecified: Secondary | ICD-10-CM | POA: Diagnosis not present

## 2017-08-14 DIAGNOSIS — E860 Dehydration: Secondary | ICD-10-CM | POA: Diagnosis not present

## 2017-08-14 DIAGNOSIS — A419 Sepsis, unspecified organism: Secondary | ICD-10-CM | POA: Diagnosis not present

## 2017-08-15 DIAGNOSIS — N401 Enlarged prostate with lower urinary tract symptoms: Secondary | ICD-10-CM | POA: Diagnosis not present

## 2017-08-15 DIAGNOSIS — K8309 Other cholangitis: Secondary | ICD-10-CM | POA: Diagnosis present

## 2017-08-15 DIAGNOSIS — K811 Chronic cholecystitis: Secondary | ICD-10-CM | POA: Diagnosis present

## 2017-08-15 DIAGNOSIS — R5383 Other fatigue: Secondary | ICD-10-CM | POA: Diagnosis not present

## 2017-08-15 DIAGNOSIS — K7200 Acute and subacute hepatic failure without coma: Secondary | ICD-10-CM | POA: Diagnosis present

## 2017-08-15 DIAGNOSIS — N179 Acute kidney failure, unspecified: Secondary | ICD-10-CM | POA: Diagnosis present

## 2017-08-15 DIAGNOSIS — F419 Anxiety disorder, unspecified: Secondary | ICD-10-CM | POA: Diagnosis present

## 2017-08-15 DIAGNOSIS — J189 Pneumonia, unspecified organism: Secondary | ICD-10-CM | POA: Diagnosis not present

## 2017-08-15 DIAGNOSIS — N400 Enlarged prostate without lower urinary tract symptoms: Secondary | ICD-10-CM | POA: Diagnosis present

## 2017-08-15 DIAGNOSIS — K810 Acute cholecystitis: Secondary | ICD-10-CM | POA: Diagnosis not present

## 2017-08-15 DIAGNOSIS — K219 Gastro-esophageal reflux disease without esophagitis: Secondary | ICD-10-CM | POA: Diagnosis not present

## 2017-08-15 DIAGNOSIS — I509 Heart failure, unspecified: Secondary | ICD-10-CM | POA: Diagnosis not present

## 2017-08-15 DIAGNOSIS — A419 Sepsis, unspecified organism: Secondary | ICD-10-CM | POA: Diagnosis present

## 2017-08-15 DIAGNOSIS — M6281 Muscle weakness (generalized): Secondary | ICD-10-CM | POA: Diagnosis not present

## 2017-08-15 DIAGNOSIS — Z950 Presence of cardiac pacemaker: Secondary | ICD-10-CM | POA: Diagnosis not present

## 2017-08-15 DIAGNOSIS — K7589 Other specified inflammatory liver diseases: Secondary | ICD-10-CM | POA: Diagnosis not present

## 2017-08-15 DIAGNOSIS — R945 Abnormal results of liver function studies: Secondary | ICD-10-CM | POA: Diagnosis not present

## 2017-08-15 DIAGNOSIS — R7989 Other specified abnormal findings of blood chemistry: Secondary | ICD-10-CM | POA: Diagnosis not present

## 2017-08-15 DIAGNOSIS — R197 Diarrhea, unspecified: Secondary | ICD-10-CM | POA: Diagnosis not present

## 2017-08-15 DIAGNOSIS — R2689 Other abnormalities of gait and mobility: Secondary | ICD-10-CM | POA: Diagnosis not present

## 2017-08-15 DIAGNOSIS — R531 Weakness: Secondary | ICD-10-CM | POA: Diagnosis not present

## 2017-08-15 DIAGNOSIS — E860 Dehydration: Secondary | ICD-10-CM | POA: Diagnosis present

## 2017-08-15 DIAGNOSIS — E039 Hypothyroidism, unspecified: Secondary | ICD-10-CM | POA: Diagnosis present

## 2017-08-15 DIAGNOSIS — R41841 Cognitive communication deficit: Secondary | ICD-10-CM | POA: Diagnosis not present

## 2017-08-15 DIAGNOSIS — I959 Hypotension, unspecified: Secondary | ICD-10-CM | POA: Diagnosis not present

## 2017-08-15 DIAGNOSIS — E785 Hyperlipidemia, unspecified: Secondary | ICD-10-CM | POA: Diagnosis present

## 2017-08-15 DIAGNOSIS — R0902 Hypoxemia: Secondary | ICD-10-CM | POA: Diagnosis not present

## 2017-08-15 DIAGNOSIS — I4891 Unspecified atrial fibrillation: Secondary | ICD-10-CM | POA: Diagnosis not present

## 2017-08-15 DIAGNOSIS — R0689 Other abnormalities of breathing: Secondary | ICD-10-CM | POA: Diagnosis not present

## 2017-08-15 DIAGNOSIS — R17 Unspecified jaundice: Secondary | ICD-10-CM | POA: Diagnosis not present

## 2017-08-15 DIAGNOSIS — F0390 Unspecified dementia without behavioral disturbance: Secondary | ICD-10-CM | POA: Diagnosis present

## 2017-08-15 DIAGNOSIS — K829 Disease of gallbladder, unspecified: Secondary | ICD-10-CM | POA: Diagnosis not present

## 2017-08-15 DIAGNOSIS — H9190 Unspecified hearing loss, unspecified ear: Secondary | ICD-10-CM | POA: Diagnosis present

## 2017-08-15 DIAGNOSIS — Z8673 Personal history of transient ischemic attack (TIA), and cerebral infarction without residual deficits: Secondary | ICD-10-CM | POA: Diagnosis not present

## 2017-08-15 DIAGNOSIS — F329 Major depressive disorder, single episode, unspecified: Secondary | ICD-10-CM | POA: Diagnosis present

## 2017-08-15 DIAGNOSIS — Z7901 Long term (current) use of anticoagulants: Secondary | ICD-10-CM | POA: Diagnosis not present

## 2017-08-15 DIAGNOSIS — K819 Cholecystitis, unspecified: Secondary | ICD-10-CM | POA: Diagnosis not present

## 2017-08-15 DIAGNOSIS — Z66 Do not resuscitate: Secondary | ICD-10-CM | POA: Diagnosis present

## 2017-08-19 DIAGNOSIS — R945 Abnormal results of liver function studies: Secondary | ICD-10-CM | POA: Diagnosis not present

## 2017-08-19 DIAGNOSIS — R2689 Other abnormalities of gait and mobility: Secondary | ICD-10-CM | POA: Diagnosis not present

## 2017-08-19 DIAGNOSIS — I4891 Unspecified atrial fibrillation: Secondary | ICD-10-CM | POA: Diagnosis not present

## 2017-08-19 DIAGNOSIS — A419 Sepsis, unspecified organism: Secondary | ICD-10-CM | POA: Diagnosis not present

## 2017-08-19 DIAGNOSIS — F419 Anxiety disorder, unspecified: Secondary | ICD-10-CM | POA: Diagnosis not present

## 2017-08-19 DIAGNOSIS — R17 Unspecified jaundice: Secondary | ICD-10-CM | POA: Diagnosis not present

## 2017-08-19 DIAGNOSIS — N179 Acute kidney failure, unspecified: Secondary | ICD-10-CM | POA: Diagnosis not present

## 2017-08-19 DIAGNOSIS — K219 Gastro-esophageal reflux disease without esophagitis: Secondary | ICD-10-CM | POA: Diagnosis not present

## 2017-08-19 DIAGNOSIS — K811 Chronic cholecystitis: Secondary | ICD-10-CM | POA: Diagnosis not present

## 2017-08-19 DIAGNOSIS — K819 Cholecystitis, unspecified: Secondary | ICD-10-CM | POA: Diagnosis not present

## 2017-08-19 DIAGNOSIS — K7200 Acute and subacute hepatic failure without coma: Secondary | ICD-10-CM | POA: Diagnosis not present

## 2017-08-19 DIAGNOSIS — K8309 Other cholangitis: Secondary | ICD-10-CM | POA: Diagnosis not present

## 2017-08-19 DIAGNOSIS — Z950 Presence of cardiac pacemaker: Secondary | ICD-10-CM | POA: Diagnosis not present

## 2017-08-19 DIAGNOSIS — R41841 Cognitive communication deficit: Secondary | ICD-10-CM | POA: Diagnosis not present

## 2017-08-19 DIAGNOSIS — N400 Enlarged prostate without lower urinary tract symptoms: Secondary | ICD-10-CM | POA: Diagnosis not present

## 2017-08-19 DIAGNOSIS — E785 Hyperlipidemia, unspecified: Secondary | ICD-10-CM | POA: Diagnosis not present

## 2017-08-19 DIAGNOSIS — E039 Hypothyroidism, unspecified: Secondary | ICD-10-CM | POA: Diagnosis not present

## 2017-08-19 DIAGNOSIS — M6281 Muscle weakness (generalized): Secondary | ICD-10-CM | POA: Diagnosis not present

## 2017-08-19 DIAGNOSIS — K810 Acute cholecystitis: Secondary | ICD-10-CM | POA: Diagnosis not present

## 2017-08-19 DIAGNOSIS — E860 Dehydration: Secondary | ICD-10-CM | POA: Diagnosis not present

## 2017-08-19 DIAGNOSIS — Z7901 Long term (current) use of anticoagulants: Secondary | ICD-10-CM | POA: Diagnosis not present

## 2017-08-19 DIAGNOSIS — F0390 Unspecified dementia without behavioral disturbance: Secondary | ICD-10-CM | POA: Diagnosis not present

## 2017-08-20 DIAGNOSIS — K8309 Other cholangitis: Secondary | ICD-10-CM | POA: Diagnosis not present

## 2017-08-20 DIAGNOSIS — A419 Sepsis, unspecified organism: Secondary | ICD-10-CM | POA: Diagnosis not present

## 2017-08-20 DIAGNOSIS — K7200 Acute and subacute hepatic failure without coma: Secondary | ICD-10-CM | POA: Diagnosis not present

## 2017-08-20 DIAGNOSIS — N179 Acute kidney failure, unspecified: Secondary | ICD-10-CM | POA: Diagnosis not present

## 2017-09-01 DIAGNOSIS — K811 Chronic cholecystitis: Secondary | ICD-10-CM | POA: Diagnosis not present

## 2017-09-22 DIAGNOSIS — K810 Acute cholecystitis: Secondary | ICD-10-CM | POA: Diagnosis not present

## 2017-09-26 DIAGNOSIS — E039 Hypothyroidism, unspecified: Secondary | ICD-10-CM | POA: Diagnosis not present

## 2017-09-26 DIAGNOSIS — Z7901 Long term (current) use of anticoagulants: Secondary | ICD-10-CM | POA: Diagnosis not present

## 2017-09-26 DIAGNOSIS — Z79899 Other long term (current) drug therapy: Secondary | ICD-10-CM | POA: Diagnosis not present

## 2017-09-26 DIAGNOSIS — I482 Chronic atrial fibrillation: Secondary | ICD-10-CM | POA: Diagnosis not present

## 2017-09-26 DIAGNOSIS — F05 Delirium due to known physiological condition: Secondary | ICD-10-CM | POA: Diagnosis not present

## 2017-09-26 DIAGNOSIS — N401 Enlarged prostate with lower urinary tract symptoms: Secondary | ICD-10-CM | POA: Diagnosis not present

## 2017-09-26 DIAGNOSIS — Z950 Presence of cardiac pacemaker: Secondary | ICD-10-CM | POA: Diagnosis not present

## 2017-09-26 DIAGNOSIS — E785 Hyperlipidemia, unspecified: Secondary | ICD-10-CM | POA: Diagnosis not present

## 2017-09-26 DIAGNOSIS — K8012 Calculus of gallbladder with acute and chronic cholecystitis without obstruction: Secondary | ICD-10-CM | POA: Diagnosis not present

## 2017-09-26 DIAGNOSIS — N400 Enlarged prostate without lower urinary tract symptoms: Secondary | ICD-10-CM | POA: Diagnosis not present

## 2017-09-26 DIAGNOSIS — Z6824 Body mass index (BMI) 24.0-24.9, adult: Secondary | ICD-10-CM | POA: Diagnosis not present

## 2017-09-26 DIAGNOSIS — F0391 Unspecified dementia with behavioral disturbance: Secondary | ICD-10-CM | POA: Diagnosis not present

## 2017-09-27 DIAGNOSIS — F0391 Unspecified dementia with behavioral disturbance: Secondary | ICD-10-CM | POA: Diagnosis not present

## 2017-09-27 DIAGNOSIS — I482 Chronic atrial fibrillation: Secondary | ICD-10-CM | POA: Diagnosis not present

## 2017-09-27 DIAGNOSIS — F05 Delirium due to known physiological condition: Secondary | ICD-10-CM | POA: Diagnosis not present

## 2017-09-27 DIAGNOSIS — N400 Enlarged prostate without lower urinary tract symptoms: Secondary | ICD-10-CM | POA: Diagnosis not present

## 2017-09-27 DIAGNOSIS — E039 Hypothyroidism, unspecified: Secondary | ICD-10-CM | POA: Diagnosis not present

## 2017-09-27 DIAGNOSIS — K8012 Calculus of gallbladder with acute and chronic cholecystitis without obstruction: Secondary | ICD-10-CM | POA: Diagnosis not present

## 2017-09-28 DIAGNOSIS — N400 Enlarged prostate without lower urinary tract symptoms: Secondary | ICD-10-CM | POA: Diagnosis not present

## 2017-09-28 DIAGNOSIS — K8012 Calculus of gallbladder with acute and chronic cholecystitis without obstruction: Secondary | ICD-10-CM | POA: Diagnosis not present

## 2017-09-28 DIAGNOSIS — F0391 Unspecified dementia with behavioral disturbance: Secondary | ICD-10-CM | POA: Diagnosis not present

## 2017-09-28 DIAGNOSIS — F05 Delirium due to known physiological condition: Secondary | ICD-10-CM | POA: Diagnosis not present

## 2017-09-28 DIAGNOSIS — E039 Hypothyroidism, unspecified: Secondary | ICD-10-CM | POA: Diagnosis not present

## 2017-09-28 DIAGNOSIS — I482 Chronic atrial fibrillation: Secondary | ICD-10-CM | POA: Diagnosis not present

## 2017-09-29 DIAGNOSIS — K810 Acute cholecystitis: Secondary | ICD-10-CM | POA: Diagnosis not present

## 2017-09-29 DIAGNOSIS — F419 Anxiety disorder, unspecified: Secondary | ICD-10-CM | POA: Diagnosis not present

## 2017-09-29 DIAGNOSIS — N401 Enlarged prostate with lower urinary tract symptoms: Secondary | ICD-10-CM | POA: Diagnosis not present

## 2017-09-29 DIAGNOSIS — I495 Sick sinus syndrome: Secondary | ICD-10-CM | POA: Diagnosis not present

## 2017-09-29 DIAGNOSIS — K8012 Calculus of gallbladder with acute and chronic cholecystitis without obstruction: Secondary | ICD-10-CM | POA: Diagnosis not present

## 2017-09-29 DIAGNOSIS — K219 Gastro-esophageal reflux disease without esophagitis: Secondary | ICD-10-CM | POA: Diagnosis not present

## 2017-09-29 DIAGNOSIS — N400 Enlarged prostate without lower urinary tract symptoms: Secondary | ICD-10-CM | POA: Diagnosis not present

## 2017-09-29 DIAGNOSIS — E871 Hypo-osmolality and hyponatremia: Secondary | ICD-10-CM | POA: Diagnosis not present

## 2017-09-29 DIAGNOSIS — F0390 Unspecified dementia without behavioral disturbance: Secondary | ICD-10-CM | POA: Diagnosis not present

## 2017-09-29 DIAGNOSIS — N183 Chronic kidney disease, stage 3 (moderate): Secondary | ICD-10-CM | POA: Diagnosis not present

## 2017-09-29 DIAGNOSIS — E039 Hypothyroidism, unspecified: Secondary | ICD-10-CM | POA: Diagnosis not present

## 2017-09-29 DIAGNOSIS — F0391 Unspecified dementia with behavioral disturbance: Secondary | ICD-10-CM | POA: Diagnosis not present

## 2017-09-29 DIAGNOSIS — D649 Anemia, unspecified: Secondary | ICD-10-CM | POA: Diagnosis not present

## 2017-09-29 DIAGNOSIS — F05 Delirium due to known physiological condition: Secondary | ICD-10-CM | POA: Diagnosis not present

## 2017-09-29 DIAGNOSIS — F0631 Mood disorder due to known physiological condition with depressive features: Secondary | ICD-10-CM | POA: Diagnosis not present

## 2017-09-29 DIAGNOSIS — I482 Chronic atrial fibrillation: Secondary | ICD-10-CM | POA: Diagnosis not present

## 2017-10-03 DIAGNOSIS — I482 Chronic atrial fibrillation: Secondary | ICD-10-CM | POA: Diagnosis not present

## 2017-10-03 DIAGNOSIS — N400 Enlarged prostate without lower urinary tract symptoms: Secondary | ICD-10-CM | POA: Diagnosis not present

## 2017-10-03 DIAGNOSIS — F05 Delirium due to known physiological condition: Secondary | ICD-10-CM | POA: Diagnosis not present

## 2017-10-03 DIAGNOSIS — E039 Hypothyroidism, unspecified: Secondary | ICD-10-CM | POA: Diagnosis not present

## 2017-10-03 DIAGNOSIS — K8012 Calculus of gallbladder with acute and chronic cholecystitis without obstruction: Secondary | ICD-10-CM | POA: Diagnosis not present

## 2017-10-03 DIAGNOSIS — F0391 Unspecified dementia with behavioral disturbance: Secondary | ICD-10-CM | POA: Diagnosis not present

## 2017-10-05 DIAGNOSIS — E039 Hypothyroidism, unspecified: Secondary | ICD-10-CM | POA: Diagnosis not present

## 2017-10-05 DIAGNOSIS — F0391 Unspecified dementia with behavioral disturbance: Secondary | ICD-10-CM | POA: Diagnosis not present

## 2017-10-05 DIAGNOSIS — K8012 Calculus of gallbladder with acute and chronic cholecystitis without obstruction: Secondary | ICD-10-CM | POA: Diagnosis not present

## 2017-10-05 DIAGNOSIS — I482 Chronic atrial fibrillation: Secondary | ICD-10-CM | POA: Diagnosis not present

## 2017-10-05 DIAGNOSIS — N400 Enlarged prostate without lower urinary tract symptoms: Secondary | ICD-10-CM | POA: Diagnosis not present

## 2017-10-05 DIAGNOSIS — F05 Delirium due to known physiological condition: Secondary | ICD-10-CM | POA: Diagnosis not present

## 2017-10-11 DIAGNOSIS — K8012 Calculus of gallbladder with acute and chronic cholecystitis without obstruction: Secondary | ICD-10-CM | POA: Diagnosis not present

## 2017-10-11 DIAGNOSIS — F0391 Unspecified dementia with behavioral disturbance: Secondary | ICD-10-CM | POA: Diagnosis not present

## 2017-10-11 DIAGNOSIS — N400 Enlarged prostate without lower urinary tract symptoms: Secondary | ICD-10-CM | POA: Diagnosis not present

## 2017-10-11 DIAGNOSIS — E039 Hypothyroidism, unspecified: Secondary | ICD-10-CM | POA: Diagnosis not present

## 2017-10-11 DIAGNOSIS — I482 Chronic atrial fibrillation: Secondary | ICD-10-CM | POA: Diagnosis not present

## 2017-10-11 DIAGNOSIS — F05 Delirium due to known physiological condition: Secondary | ICD-10-CM | POA: Diagnosis not present

## 2017-10-12 DIAGNOSIS — E039 Hypothyroidism, unspecified: Secondary | ICD-10-CM | POA: Diagnosis not present

## 2017-10-12 DIAGNOSIS — F0391 Unspecified dementia with behavioral disturbance: Secondary | ICD-10-CM | POA: Diagnosis not present

## 2017-10-12 DIAGNOSIS — N400 Enlarged prostate without lower urinary tract symptoms: Secondary | ICD-10-CM | POA: Diagnosis not present

## 2017-10-12 DIAGNOSIS — F05 Delirium due to known physiological condition: Secondary | ICD-10-CM | POA: Diagnosis not present

## 2017-10-12 DIAGNOSIS — I482 Chronic atrial fibrillation: Secondary | ICD-10-CM | POA: Diagnosis not present

## 2017-10-12 DIAGNOSIS — K8012 Calculus of gallbladder with acute and chronic cholecystitis without obstruction: Secondary | ICD-10-CM | POA: Diagnosis not present

## 2017-10-13 DIAGNOSIS — I739 Peripheral vascular disease, unspecified: Secondary | ICD-10-CM | POA: Diagnosis not present

## 2017-10-13 DIAGNOSIS — L609 Nail disorder, unspecified: Secondary | ICD-10-CM | POA: Diagnosis not present

## 2017-10-13 DIAGNOSIS — L110 Acquired keratosis follicularis: Secondary | ICD-10-CM | POA: Diagnosis not present

## 2017-10-14 DIAGNOSIS — I482 Chronic atrial fibrillation: Secondary | ICD-10-CM | POA: Diagnosis not present

## 2017-10-14 DIAGNOSIS — K8012 Calculus of gallbladder with acute and chronic cholecystitis without obstruction: Secondary | ICD-10-CM | POA: Diagnosis not present

## 2017-10-14 DIAGNOSIS — N400 Enlarged prostate without lower urinary tract symptoms: Secondary | ICD-10-CM | POA: Diagnosis not present

## 2017-10-14 DIAGNOSIS — F05 Delirium due to known physiological condition: Secondary | ICD-10-CM | POA: Diagnosis not present

## 2017-10-14 DIAGNOSIS — E039 Hypothyroidism, unspecified: Secondary | ICD-10-CM | POA: Diagnosis not present

## 2017-10-14 DIAGNOSIS — F0391 Unspecified dementia with behavioral disturbance: Secondary | ICD-10-CM | POA: Diagnosis not present

## 2017-10-17 ENCOUNTER — Encounter: Payer: Self-pay | Admitting: *Deleted

## 2017-10-19 DIAGNOSIS — I482 Chronic atrial fibrillation: Secondary | ICD-10-CM | POA: Diagnosis not present

## 2017-10-19 DIAGNOSIS — K8012 Calculus of gallbladder with acute and chronic cholecystitis without obstruction: Secondary | ICD-10-CM | POA: Diagnosis not present

## 2017-10-19 DIAGNOSIS — N400 Enlarged prostate without lower urinary tract symptoms: Secondary | ICD-10-CM | POA: Diagnosis not present

## 2017-10-19 DIAGNOSIS — F0391 Unspecified dementia with behavioral disturbance: Secondary | ICD-10-CM | POA: Diagnosis not present

## 2017-10-19 DIAGNOSIS — F05 Delirium due to known physiological condition: Secondary | ICD-10-CM | POA: Diagnosis not present

## 2017-10-19 DIAGNOSIS — E039 Hypothyroidism, unspecified: Secondary | ICD-10-CM | POA: Diagnosis not present

## 2017-10-21 DIAGNOSIS — F0631 Mood disorder due to known physiological condition with depressive features: Secondary | ICD-10-CM | POA: Diagnosis not present

## 2017-10-21 DIAGNOSIS — Z6824 Body mass index (BMI) 24.0-24.9, adult: Secondary | ICD-10-CM | POA: Diagnosis not present

## 2017-10-21 DIAGNOSIS — F0390 Unspecified dementia without behavioral disturbance: Secondary | ICD-10-CM | POA: Diagnosis not present

## 2017-10-21 DIAGNOSIS — K810 Acute cholecystitis: Secondary | ICD-10-CM | POA: Diagnosis not present

## 2017-10-21 DIAGNOSIS — N401 Enlarged prostate with lower urinary tract symptoms: Secondary | ICD-10-CM | POA: Diagnosis not present

## 2017-10-21 DIAGNOSIS — F419 Anxiety disorder, unspecified: Secondary | ICD-10-CM | POA: Diagnosis not present

## 2017-10-21 DIAGNOSIS — I482 Chronic atrial fibrillation: Secondary | ICD-10-CM | POA: Diagnosis not present

## 2017-10-21 DIAGNOSIS — F05 Delirium due to known physiological condition: Secondary | ICD-10-CM | POA: Diagnosis not present

## 2017-10-23 DIAGNOSIS — F0631 Mood disorder due to known physiological condition with depressive features: Secondary | ICD-10-CM | POA: Diagnosis not present

## 2017-10-23 DIAGNOSIS — F0390 Unspecified dementia without behavioral disturbance: Secondary | ICD-10-CM | POA: Diagnosis not present

## 2017-10-23 DIAGNOSIS — F05 Delirium due to known physiological condition: Secondary | ICD-10-CM | POA: Diagnosis not present

## 2017-10-23 DIAGNOSIS — E871 Hypo-osmolality and hyponatremia: Secondary | ICD-10-CM | POA: Diagnosis not present

## 2017-10-23 DIAGNOSIS — K810 Acute cholecystitis: Secondary | ICD-10-CM | POA: Diagnosis not present

## 2017-10-23 DIAGNOSIS — N401 Enlarged prostate with lower urinary tract symptoms: Secondary | ICD-10-CM | POA: Diagnosis not present

## 2017-10-23 DIAGNOSIS — F419 Anxiety disorder, unspecified: Secondary | ICD-10-CM | POA: Diagnosis not present

## 2017-10-23 DIAGNOSIS — D649 Anemia, unspecified: Secondary | ICD-10-CM | POA: Diagnosis not present

## 2017-10-25 ENCOUNTER — Encounter: Payer: Self-pay | Admitting: Cardiology

## 2017-10-28 DIAGNOSIS — F05 Delirium due to known physiological condition: Secondary | ICD-10-CM | POA: Diagnosis not present

## 2017-10-28 DIAGNOSIS — E039 Hypothyroidism, unspecified: Secondary | ICD-10-CM | POA: Diagnosis not present

## 2017-10-28 DIAGNOSIS — K8012 Calculus of gallbladder with acute and chronic cholecystitis without obstruction: Secondary | ICD-10-CM | POA: Diagnosis not present

## 2017-10-28 DIAGNOSIS — N400 Enlarged prostate without lower urinary tract symptoms: Secondary | ICD-10-CM | POA: Diagnosis not present

## 2017-10-28 DIAGNOSIS — I482 Chronic atrial fibrillation: Secondary | ICD-10-CM | POA: Diagnosis not present

## 2017-10-28 DIAGNOSIS — F0391 Unspecified dementia with behavioral disturbance: Secondary | ICD-10-CM | POA: Diagnosis not present

## 2017-10-29 ENCOUNTER — Inpatient Hospital Stay (HOSPITAL_COMMUNITY)
Admission: AD | Admit: 2017-10-29 | Discharge: 2017-11-03 | DRG: 439 | Disposition: A | Discharge status: Home Health | Payer: MEDICARE | Source: Other Acute Inpatient Hospital | Attending: Internal Medicine | Admitting: Internal Medicine

## 2017-10-29 DIAGNOSIS — Z934 Other artificial openings of gastrointestinal tract status: Secondary | ICD-10-CM

## 2017-10-29 DIAGNOSIS — K8050 Calculus of bile duct without cholangitis or cholecystitis without obstruction: Secondary | ICD-10-CM

## 2017-10-29 DIAGNOSIS — Z888 Allergy status to other drugs, medicaments and biological substances status: Secondary | ICD-10-CM | POA: Diagnosis not present

## 2017-10-29 DIAGNOSIS — D649 Anemia, unspecified: Secondary | ICD-10-CM | POA: Diagnosis present

## 2017-10-29 DIAGNOSIS — R109 Unspecified abdominal pain: Secondary | ICD-10-CM | POA: Diagnosis not present

## 2017-10-29 DIAGNOSIS — I700 Atherosclerosis of aorta: Secondary | ICD-10-CM | POA: Diagnosis not present

## 2017-10-29 DIAGNOSIS — F05 Delirium due to known physiological condition: Secondary | ICD-10-CM | POA: Diagnosis present

## 2017-10-29 DIAGNOSIS — I10 Essential (primary) hypertension: Secondary | ICD-10-CM | POA: Diagnosis not present

## 2017-10-29 DIAGNOSIS — F0280 Dementia in other diseases classified elsewhere without behavioral disturbance: Secondary | ICD-10-CM | POA: Diagnosis present

## 2017-10-29 DIAGNOSIS — R7989 Other specified abnormal findings of blood chemistry: Secondary | ICD-10-CM | POA: Diagnosis not present

## 2017-10-29 DIAGNOSIS — R413 Other amnesia: Secondary | ICD-10-CM | POA: Diagnosis present

## 2017-10-29 DIAGNOSIS — Z9049 Acquired absence of other specified parts of digestive tract: Secondary | ICD-10-CM

## 2017-10-29 DIAGNOSIS — Z87891 Personal history of nicotine dependence: Secondary | ICD-10-CM | POA: Diagnosis not present

## 2017-10-29 DIAGNOSIS — E039 Hypothyroidism, unspecified: Secondary | ICD-10-CM | POA: Diagnosis not present

## 2017-10-29 DIAGNOSIS — R945 Abnormal results of liver function studies: Secondary | ICD-10-CM | POA: Diagnosis not present

## 2017-10-29 DIAGNOSIS — R1011 Right upper quadrant pain: Secondary | ICD-10-CM | POA: Diagnosis not present

## 2017-10-29 DIAGNOSIS — Z950 Presence of cardiac pacemaker: Secondary | ICD-10-CM | POA: Diagnosis not present

## 2017-10-29 DIAGNOSIS — R932 Abnormal findings on diagnostic imaging of liver and biliary tract: Secondary | ICD-10-CM | POA: Diagnosis not present

## 2017-10-29 DIAGNOSIS — K219 Gastro-esophageal reflux disease without esophagitis: Secondary | ICD-10-CM | POA: Diagnosis present

## 2017-10-29 DIAGNOSIS — G309 Alzheimer's disease, unspecified: Secondary | ICD-10-CM | POA: Diagnosis present

## 2017-10-29 DIAGNOSIS — I480 Paroxysmal atrial fibrillation: Secondary | ICD-10-CM | POA: Diagnosis not present

## 2017-10-29 DIAGNOSIS — K851 Biliary acute pancreatitis without necrosis or infection: Principal | ICD-10-CM | POA: Diagnosis present

## 2017-10-29 DIAGNOSIS — K859 Acute pancreatitis without necrosis or infection, unspecified: Secondary | ICD-10-CM | POA: Diagnosis not present

## 2017-10-29 DIAGNOSIS — Z7901 Long term (current) use of anticoagulants: Secondary | ICD-10-CM | POA: Diagnosis not present

## 2017-10-29 DIAGNOSIS — Z793 Long term (current) use of hormonal contraceptives: Secondary | ICD-10-CM

## 2017-10-29 DIAGNOSIS — K811 Chronic cholecystitis: Secondary | ICD-10-CM | POA: Diagnosis not present

## 2017-10-29 DIAGNOSIS — D696 Thrombocytopenia, unspecified: Secondary | ICD-10-CM | POA: Diagnosis present

## 2017-10-29 DIAGNOSIS — I517 Cardiomegaly: Secondary | ICD-10-CM | POA: Diagnosis not present

## 2017-10-29 DIAGNOSIS — K861 Other chronic pancreatitis: Secondary | ICD-10-CM | POA: Diagnosis not present

## 2017-10-29 DIAGNOSIS — I495 Sick sinus syndrome: Secondary | ICD-10-CM | POA: Diagnosis present

## 2017-10-29 DIAGNOSIS — Z8673 Personal history of transient ischemic attack (TIA), and cerebral infarction without residual deficits: Secondary | ICD-10-CM | POA: Diagnosis not present

## 2017-10-29 DIAGNOSIS — H548 Legal blindness, as defined in USA: Secondary | ICD-10-CM | POA: Diagnosis present

## 2017-10-29 DIAGNOSIS — Z79899 Other long term (current) drug therapy: Secondary | ICD-10-CM | POA: Diagnosis not present

## 2017-10-29 DIAGNOSIS — N289 Disorder of kidney and ureter, unspecified: Secondary | ICD-10-CM | POA: Diagnosis present

## 2017-10-29 DIAGNOSIS — K838 Other specified diseases of biliary tract: Secondary | ICD-10-CM | POA: Diagnosis not present

## 2017-10-29 DIAGNOSIS — F419 Anxiety disorder, unspecified: Secondary | ICD-10-CM | POA: Diagnosis not present

## 2017-10-30 ENCOUNTER — Other Ambulatory Visit: Payer: Self-pay

## 2017-10-30 ENCOUNTER — Encounter (HOSPITAL_COMMUNITY): Payer: Self-pay | Admitting: Family Medicine

## 2017-10-30 ENCOUNTER — Inpatient Hospital Stay (HOSPITAL_COMMUNITY): Payer: MEDICARE

## 2017-10-30 DIAGNOSIS — K811 Chronic cholecystitis: Secondary | ICD-10-CM | POA: Diagnosis present

## 2017-10-30 DIAGNOSIS — I10 Essential (primary) hypertension: Secondary | ICD-10-CM | POA: Diagnosis present

## 2017-10-30 DIAGNOSIS — K859 Acute pancreatitis without necrosis or infection, unspecified: Secondary | ICD-10-CM | POA: Diagnosis present

## 2017-10-30 DIAGNOSIS — K851 Biliary acute pancreatitis without necrosis or infection: Principal | ICD-10-CM

## 2017-10-30 DIAGNOSIS — E039 Hypothyroidism, unspecified: Secondary | ICD-10-CM | POA: Diagnosis present

## 2017-10-30 DIAGNOSIS — Z8673 Personal history of transient ischemic attack (TIA), and cerebral infarction without residual deficits: Secondary | ICD-10-CM

## 2017-10-30 DIAGNOSIS — R945 Abnormal results of liver function studies: Secondary | ICD-10-CM

## 2017-10-30 DIAGNOSIS — R413 Other amnesia: Secondary | ICD-10-CM | POA: Diagnosis present

## 2017-10-30 LAB — CBC WITH DIFFERENTIAL/PLATELET
ABS IMMATURE GRANULOCYTES: 0.1 10*3/uL (ref 0.0–0.1)
BASOS ABS: 0 10*3/uL (ref 0.0–0.1)
BASOS PCT: 0 %
Eosinophils Absolute: 0.2 10*3/uL (ref 0.0–0.7)
Eosinophils Relative: 2 %
HCT: 30.3 % — ABNORMAL LOW (ref 39.0–52.0)
HEMOGLOBIN: 10 g/dL — AB (ref 13.0–17.0)
IMMATURE GRANULOCYTES: 1 %
LYMPHS PCT: 5 %
Lymphs Abs: 0.7 10*3/uL (ref 0.7–4.0)
MCH: 31.3 pg (ref 26.0–34.0)
MCHC: 33 g/dL (ref 30.0–36.0)
MCV: 94.7 fL (ref 78.0–100.0)
Monocytes Absolute: 0.8 10*3/uL (ref 0.1–1.0)
Monocytes Relative: 6 %
NEUTROS ABS: 11.3 10*3/uL — AB (ref 1.7–7.7)
NEUTROS PCT: 86 %
PLATELETS: 144 10*3/uL — AB (ref 150–400)
RBC: 3.2 MIL/uL — AB (ref 4.22–5.81)
RDW: 13.4 % (ref 11.5–15.5)
WBC: 13 10*3/uL — ABNORMAL HIGH (ref 4.0–10.5)

## 2017-10-30 LAB — COMPREHENSIVE METABOLIC PANEL
ALBUMIN: 2.8 g/dL — AB (ref 3.5–5.0)
ALK PHOS: 296 U/L — AB (ref 38–126)
ALT: 204 U/L — ABNORMAL HIGH (ref 0–44)
ANION GAP: 7 (ref 5–15)
AST: 193 U/L — ABNORMAL HIGH (ref 15–41)
BILIRUBIN TOTAL: 4.8 mg/dL — AB (ref 0.3–1.2)
BUN: 17 mg/dL (ref 8–23)
CALCIUM: 8.4 mg/dL — AB (ref 8.9–10.3)
CO2: 25 mmol/L (ref 22–32)
Chloride: 101 mmol/L (ref 98–111)
Creatinine, Ser: 1.34 mg/dL — ABNORMAL HIGH (ref 0.61–1.24)
GFR calc non Af Amer: 44 mL/min — ABNORMAL LOW (ref >60)
GFR, EST AFRICAN AMERICAN: 51 mL/min — AB (ref >60)
Glucose, Bld: 103 mg/dL — ABNORMAL HIGH (ref 70–99)
POTASSIUM: 4.4 mmol/L (ref 3.5–5.1)
SODIUM: 133 mmol/L — AB (ref 135–145)
TOTAL PROTEIN: 5.3 g/dL — AB (ref 6.5–8.1)

## 2017-10-30 LAB — APTT
APTT: 120 s — AB (ref 24–36)
aPTT: 35 seconds (ref 24–36)

## 2017-10-30 LAB — GLUCOSE, CAPILLARY: GLUCOSE-CAPILLARY: 79 mg/dL (ref 70–99)

## 2017-10-30 LAB — LIPASE, BLOOD: Lipase: 1145 U/L — ABNORMAL HIGH (ref 11–51)

## 2017-10-30 LAB — TRIGLYCERIDES: TRIGLYCERIDES: 31 mg/dL (ref <150)

## 2017-10-30 LAB — HEPARIN LEVEL (UNFRACTIONATED): HEPARIN UNFRACTIONATED: 1.54 [IU]/mL — AB (ref 0.30–0.70)

## 2017-10-30 MED ORDER — HEPARIN (PORCINE) IN NACL 100-0.45 UNIT/ML-% IJ SOLN
850.0000 [IU]/h | INTRAMUSCULAR | Status: DC
  Administered 2017-10-30: 1000 [IU]/h via INTRAVENOUS
  Filled 2017-10-30: qty 250

## 2017-10-30 MED ORDER — LORAZEPAM 2 MG/ML IJ SOLN
0.5000 mg | Freq: Four times a day (QID) | INTRAMUSCULAR | Status: DC | PRN
  Administered 2017-10-30 – 2017-11-03 (×3): 0.5 mg via INTRAVENOUS
  Filled 2017-10-30 (×3): qty 1

## 2017-10-30 MED ORDER — HEPARIN SODIUM (PORCINE) 5000 UNIT/ML IJ SOLN
5000.0000 [IU] | Freq: Three times a day (TID) | INTRAMUSCULAR | Status: DC
  Administered 2017-10-30 – 2017-11-02 (×10): 5000 [IU] via SUBCUTANEOUS
  Filled 2017-10-30 (×10): qty 1

## 2017-10-30 MED ORDER — FAMOTIDINE IN NACL 20-0.9 MG/50ML-% IV SOLN
20.0000 mg | INTRAVENOUS | Status: DC
  Administered 2017-10-31 – 2017-11-02 (×3): 20 mg via INTRAVENOUS
  Filled 2017-10-30 (×3): qty 50

## 2017-10-30 MED ORDER — SODIUM CHLORIDE 0.9 % IV SOLN
1.5000 g | Freq: Four times a day (QID) | INTRAVENOUS | Status: DC
  Administered 2017-10-30 – 2017-11-03 (×15): 1.5 g via INTRAVENOUS
  Filled 2017-10-30 (×18): qty 1.5

## 2017-10-30 MED ORDER — FENTANYL CITRATE (PF) 100 MCG/2ML IJ SOLN: 6.2500 ug | INTRAMUSCULAR | Status: DC | PRN

## 2017-10-30 MED ORDER — FENTANYL CITRATE (PF) 100 MCG/2ML IJ SOLN
12.5000 ug | INTRAMUSCULAR | Status: DC | PRN
  Administered 2017-10-30 (×3): 12.5 ug via INTRAVENOUS
  Filled 2017-10-30 (×3): qty 2

## 2017-10-30 MED ORDER — HYDRALAZINE HCL 20 MG/ML IJ SOLN: 5.0000 mg | INTRAMUSCULAR | Status: DC | PRN

## 2017-10-30 MED ORDER — LACTATED RINGERS IV SOLN
INTRAVENOUS | Status: DC
  Administered 2017-10-30: 100 mL via INTRAVENOUS
  Administered 2017-11-01 – 2017-11-02 (×2): via INTRAVENOUS

## 2017-10-30 MED ORDER — SODIUM CHLORIDE 0.9 % IV SOLN
INTRAVENOUS | Status: AC
  Administered 2017-10-30: 01:00:00 via INTRAVENOUS

## 2017-10-30 MED ORDER — ONDANSETRON HCL 4 MG/2ML IJ SOLN
4.0000 mg | Freq: Four times a day (QID) | INTRAMUSCULAR | Status: DC | PRN
  Administered 2017-10-31: 4 mg via INTRAVENOUS
  Filled 2017-10-30 (×2): qty 2

## 2017-10-30 MED ORDER — ACETAMINOPHEN 650 MG RE SUPP: 650.0000 mg | Freq: Four times a day (QID) | RECTAL | Status: DC | PRN

## 2017-10-30 MED ORDER — ENOXAPARIN SODIUM 30 MG/0.3ML ~~LOC~~ SOLN: 30.0000 mg | SUBCUTANEOUS | Status: DC

## 2017-10-30 MED ORDER — ONDANSETRON HCL 4 MG PO TABS: 4.0000 mg | ORAL_TABLET | Freq: Four times a day (QID) | ORAL | Status: DC | PRN

## 2017-10-30 MED ORDER — FAMOTIDINE IN NACL 20-0.9 MG/50ML-% IV SOLN
20.0000 mg | Freq: Two times a day (BID) | INTRAVENOUS | Status: DC
  Administered 2017-10-30 (×2): 20 mg via INTRAVENOUS
  Filled 2017-10-30 (×2): qty 50

## 2017-10-30 MED ORDER — ACETAMINOPHEN 325 MG PO TABS: 650.0000 mg | ORAL_TABLET | Freq: Four times a day (QID) | ORAL | Status: DC | PRN

## 2017-10-30 NOTE — Progress Notes (Signed)
PROGRESS NOTE    Wesley Dyer  WIO:[id number] DOB: 1923-08-17 DOA: 10/29/2017 PCP: Curlene Labrum, MD   Brief Narrative: Wesley Dyer is Wesley Dyer 84 y.o. male with medical history significant for paroxysmal atrial fibrillation on Eliquis, history of TIA, anxiety, hypothyroidism, and tachybradycardia syndrome with pacer, now presenting to the [facility] emergency department with reports of upper abdominal pain and nausea with vomiting.  Per ED documentation, the patient had been complaining of intermittent right upper quadrant pain for the past week or so, but constant pain for the past day.  He has had nausea and vomiting.  He was reportedly seen previously for gallbladder problems and was told that he was not Wesley Dyer surgical candidate.  On arrival to [facility], patient denies any abdominal pain and has difficulty explaining why he went to the emergency department, reporting that "I had problems with my pressure but it is difficult to explain."  He also denies nausea and vomiting at this time.  On chart review, the patient had been seen by surgery in June of this year following hospitalization for upper abdominal pain with slight elevation in LFTs.  He had gallbladder distention on CT at that time, HIDA scan demonstrated delayed visualization, LFTs normalized, patient was treated conservatively, symptoms resolved, and he was discharged home.  Surgery did not feel that he was Wesley Dyer surgical candidate.  He was asked to contact their office if the symptoms returned.  Assessment & Plan:   Principal Problem:   Acute pancreatitis Active Problems:   AF (paroxysmal atrial fibrillation) (HCC)   Anxiety   Hypothyroidism   Memory loss   Essential hypertension   History of TIAs   Chronic cholecystitis without calculus   1. Gallstone Pancreatitis  Possible Choledocholithiasis - He was found to have lipase ">3000" in the ED with transaminases in 300-range and total bilirubin of 3.1 ->  Here lipase 1,145 and  AST/ALT 193/204 with bilirubin of 4.8. - CT abd/pelvis demonstrated gallbladder distension without wall-thickening or ductal dilation  - RUQ US showed distended gallbladder, thickened and edematous gallbladder wall and sludge, but no stone and negative murphy's - Pt unable to get MRCP with pacemaker - GI consult, appreciate recs -> start unasyn, clear liquids, IVF, possible ERCP if no improvement - Surgery c/s, appreciate recs -> abx, trend labs, poor surgical candidate, follow  2. Tachy-brady syndrome   - Status-post pacer placement    3. Paroxysmal atrial fibrillation  - In sinus rhythm on admission  - CHADS-VASc 4 (age x2, TIA x2)  - Holding eliquis, will stop heparin as well (discussed with wife in setting of fall here in hospital and need for potential procedure - will plan to resume eliquis when able).  # Fall: pt with witnessed fall, helped to ground by tech.  No obvious injury.  No complaints of pain.  No head trauma.  No msk ttp on my exam.  Will get sitter, place delirium precautions, decrease fentanyl dose as this was thought to contribute.  4. Anxiety  - Continue low-dose benzodiazepine prn    5. Memory loss  - On bowel rest currently, resume memantine when appropriate    6. Hypothyroidism  - On bowel rest currently, resume Synthroid when appropriate    # Thrombocytopenia: mild, follow  # Leukocytosis: due to above, follow  DVT prophylaxis: heparin Code Status: full  Family Communication: wife at bedside and then by phone after fall Disposition Plan: pending improvement   Consultants:   GI  Surgery  Procedures:  none  Antimicrobials:  Anti-infectives (From admission, onward)   Start     Dose/Rate Route Frequency Ordered Stop   10/30/17 1100  ampicillin-sulbactam (UNASYN) 1.5 g in sodium chloride 0.9 % 100 mL IVPB     1.5 g 200 mL/hr over 30 Minutes Intravenous Every 6 hours 10/30/17 1028       Subjective: C/o abdominal pain. Seems to be  better.  Objective: Vitals:   10/30/17 0506 10/30/17 0508 10/30/17 1306 10/30/17 1600  BP: (!) 82/43 (!) 99/50 129/63 126/68  Pulse: (!) 106 69 72 70  Resp: 16  16 18   Temp: 98.6 F (37 C)  98 F (36.7 C) 98.1 F (36.7 C)  TempSrc: Oral   Oral  SpO2: 91% 96% 98% 98%  Weight:      Height:        Intake/Output Summary (Last 24 hours) at 10/30/2017 1818 Last data filed at 10/30/2017 1600 Gross per 24 hour  Intake 1308.88 ml  Output 650 ml  Net 658.88 ml   Filed Weights   10/29/17 2350  Weight: 75.3 kg    Examination:  General exam: Appears calm and comfortable  Respiratory system: Clear to auscultation. Respiratory effort normal. Cardiovascular system: S1 & S2 heard, RRR.  Gastrointestinal system: Abdomen is nondistended, soft and TTP in LUQ.  Central nervous system: Alert and oriented. No focal neurological deficits. Extremities: Symmetric 5 x 5 power. Skin: No rashes, lesions or ulcers Psychiatry: Judgement and insight appear normal. Mood & affect appropriate.   After fall, pt awake and alert.  Denied pain. No ttp to extremities (upper or lower, stable pelvis, no midline TTP of spine) CN 2-12 grossly intact, moving all extremities.   Data Reviewed: I have personally reviewed following labs and imaging studies  CBC: Recent Labs  Lab 10/30/17 0110  WBC 13.0*  NEUTROABS 11.3*  HGB 10.0*  HCT 30.3*  MCV 94.7  PLT 830*   Basic Metabolic Panel: Recent Labs  Lab 10/30/17 0110  NA 133*  K 4.4  CL 101  CO2 25  GLUCOSE 103*  BUN 17  CREATININE 1.34*  CALCIUM 8.4*   GFR: Estimated Creatinine Clearance: 37.5 mL/min (Wesley Dyer) (by C-G formula based on SCr of 1.34 mg/dL (H)). Liver Function Tests: Recent Labs  Lab 10/30/17 0110  AST 193*  ALT 204*  ALKPHOS 296*  BILITOT 4.8*  PROT 5.3*  ALBUMIN 2.8*   Recent Labs  Lab 10/30/17 0110  LIPASE 1,145*   No results for input(s): AMMONIA in the last 168 hours. Coagulation Profile: No results for input(s):  INR, PROTIME in the last 168 hours. Cardiac Enzymes: No results for input(s): CKTOTAL, CKMB, CKMBINDEX, TROPONINI in the last 168 hours. BNP (last 3 results) No results for input(s): PROBNP in the last 8760 hours. HbA1C: No results for input(s): HGBA1C in the last 72 hours. CBG: Recent Labs  Lab 10/30/17 0757  GLUCAP 79   Lipid Profile: Recent Labs    10/30/17 0110  TRIG 31   Thyroid Function Tests: No results for input(s): TSH, T4TOTAL, FREET4, T3FREE, THYROIDAB in the last 72 hours. Anemia Panel: No results for input(s): VITAMINB12, FOLATE, FERRITIN, TIBC, IRON, RETICCTPCT in the last 72 hours. Sepsis Labs: No results for input(s): PROCALCITON, LATICACIDVEN in the last 168 hours.  No results found for this or any previous visit (from the past 240 hour(s)).       Radiology Studies: US Abdomen Limited Ruq  Result Date: 10/30/2017 CLINICAL DATA:  Acute pancreatitis. EXAM: ULTRASOUND ABDOMEN LIMITED  RIGHT UPPER QUADRANT COMPARISON:  CT abdomen and pelvis 02/28/2007 FINDINGS: Gallbladder: Gallbladder is mildly distended with diffuse gallbladder wall thickening and edema. Small sludge demonstrated in the dependent gallbladder. No discrete stones. Murphy's sign is negative. Common bile duct: Diameter: 5.4 mm, normal. Distal bile duct is obscured by bowel gas. Liver: No focal lesion identified. Within normal limits in parenchymal echogenicity. Portal vein is patent on color Doppler imaging with normal direction of blood flow towards the liver. IMPRESSION: Nonspecific finding with distended gallbladder, thickened and edematous gallbladder wall, and gallbladder sludge but no stone and negative Murphy's sign. Changes could represent chronic inflammatory process. Consider radionuclide biliary scan to evaluate gallbladder function if clinically indicated. Electronically Signed   By: Lucienne Capers M.D.   On: 10/30/2017 03:43        Scheduled Meds: . heparin injection (subcutaneous)   5,000 Units Subcutaneous Q8H   Continuous Infusions: . ampicillin-sulbactam (UNASYN) IV 1.5 g (10/30/17 1700)  . [START ON 10/31/2017] famotidine (PEPCID) IV    . lactated ringers 100 mL (10/30/17 1721)     LOS: 1 day    Time spent: over 30 min    Fayrene Helper, MD [facility] Pager [phone]  If 7PM-7AM, please contact night-coverage [email or web address] Password [email or web address] 10/30/2017, 6:18 PM

## 2017-10-30 NOTE — Progress Notes (Signed)
ANTICOAGULATION CONSULT NOTE - Initial Consult  Pharmacy Consult for Heparin Indication: atrial fibrillation  Allergies  Allergen Reactions  . Warfarin Sodium     REACTION: bleeding    Patient Measurements: Height: 6' (182.9 cm) Weight: 166 lb (75.3 kg) IBW/kg (Calculated) : 77.6  Vital Signs:    Labs: Recent Labs    10/30/17 0110  HGB 10.0*  HCT 30.3*  PLT 144*  CREATININE 1.34*    Estimated Creatinine Clearance: 37.5 mL/min (A) (by C-G formula based on SCr of 1.34 mg/dL (H)).   Medical History: Past Medical History:  Diagnosis Date  . Chest pain    unspecified  . Chest pain, atypical   . Depression   . Depression   . Drug therapy    Coumadin Therapy  . Gastroesophageal reflux disease   . Gastroesophageal reflux disease   . Hypothyroidism   . Paroxysmal atrial fibrillation [facility]) May 2011  . Tachycardia-bradycardia syndrome ([place])    7 second pauses documented  . Transient ischemic attack    high risk for thromboembolic disease, secondary to atrial fibrillation, patient declines Coumadin due to prior history of severe epistaxis. Patient not willing to restart Coumadin therapy    Medications:  Medications Prior to Admission  Medication Sig Dispense Refill Last Dose  . ALPRAZolam (XANAX) 0.25 MG tablet Take 1 tablet by mouth daily as needed.   Taking  . ELIQUIS 5 MG TABS tablet take 1 tablet by mouth twice a day 180 tablet 3   . levothyroxine (SYNTHROID, LEVOTHROID) 75 MCG tablet Take 1 tablet by mouth daily.   Taking  . memantine (NAMENDA) 10 MG tablet Take 1 tablet by mouth 2 (two) times daily.  0 Taking  . metoprolol tartrate (LOPRESSOR) 25 MG tablet Take 0.5 tablets (12.5 mg total) 2 (two) times daily by mouth. 15 tablet 0   . Multiple Vitamins-Minerals (OCUVITE PRESERVISION) TABS Take 1 tablet by mouth 2 (two) times daily.     Taking  . pantoprazole (PROTONIX) 40 MG tablet Take 1 tablet by mouth daily.   Taking  . simvastatin (ZOCOR) 20 MG tablet Take  20 mg by mouth at bedtime.     Taking  . tamsulosin (FLOMAX) 0.4 MG CAPS capsule Take 0.4 mg by mouth daily after supper.    Taking    Assessment: 84 y.o. male admitted with pancreatitis, h/o Afib and Eliquis on hold, for heparin  Goal of Therapy:  APTT 66-102 Heparin level 0.3-0.7 units/ml Monitor platelets by anticoagulation protocol: Yes   Plan:  Start heparin 1000 units/hr APTT and heparin level in 8 hours  Donnisha Besecker, [place] 10/30/2017,3:52 AM

## 2017-10-30 NOTE — Progress Notes (Signed)
Pharmacy Antibiotic Note  LORD LANCOUR is a 84 y.o. male admitted on 10/29/2017 with concern for choledocholithiasis and pancreatitis.  Pharmacy has been consulted for Unasyn dosing.  Plan: - Continue Unasyn 1.5g IV every 6 hours - Will continue to follow renal function, culture results, LOT, and antibiotic de-escalation plans   Height: 6' (182.9 cm) Weight: 166 lb (75.3 kg) IBW/kg (Calculated) : 77.6  Temp (24hrs), Avg:98.6 F (37 C), Min:98.6 F (37 C), Max:98.6 F (37 C)  Recent Labs  Lab 10/30/17 0110  WBC 13.0*  CREATININE 1.34*    Estimated Creatinine Clearance: 37.5 mL/min (A) (by C-G formula based on SCr of 1.34 mg/dL (H)).    Allergies  Allergen Reactions  . Warfarin Sodium Other (See Comments)    REACTION: bleeding    Alycia Rossetti, PharmD, BCPS Pager: [phone] 1:44 PM

## 2017-10-30 NOTE — Progress Notes (Signed)
Wesley Dyer 84 y o male patient received in the unit. Patient is alert and oriented x3. Vital signs are stable. Skin assessment done with another nurse. Iv in place and running fluid. Patient given instructions about call bell and phone. Bed in low position and call bell in reach.

## 2017-10-30 NOTE — Evaluation (Signed)
Physical Therapy Evaluation Patient Details Name: Wesley Dyer MRN: [id number] DOB: 18-Nov-1923 Today's Date: 10/30/2017   History of Present Illness  Pt is a 84 y.o. M with significant PMH of paroxysmal atrial fibrillation on Eliquid, history of TIA, anxiety, hypothyroidism, and tachybradycardia syndrome with pacer, now presenting with reports of upper abdominal pain and nausea with vomiting.   Clinical Impression  Pt admitted with above diagnosis. Pt currently with functional limitations due to the deficits listed below (see PT Problem List). Patient reports abdominal pain has resolved. Patient seems close to baseline with functional mobility, however, does exhibit slowed, shuffling gait pattern with balance deficits. Ambulating 100 feet with cane and min guard assist. Gait speed indicative of increased risk for falls. Discussed recommendation of HHPT with patient and patient family. Pt will benefit from skilled PT to increase their independence and safety with mobility to allow discharge to the venue listed below.       Follow Up Recommendations Home health PT    Equipment Recommendations  None recommended by PT    Recommendations for Other Services       Precautions / Restrictions Precautions Precautions: Fall Restrictions Weight Bearing Restrictions: No      Mobility  Bed Mobility Overal bed mobility: Modified Independent             General bed mobility comments: increased time to reach edge of bed  Transfers Overall transfer level: Needs assistance Equipment used: Straight cane Transfers: Sit to/from Stand Sit to Stand: Supervision            Ambulation/Gait Ambulation/Gait assistance: Min guard Gait Distance (Feet): 100 Feet Assistive device: Straight cane Gait Pattern/deviations: Step-through pattern;Shuffle;Decreased dorsiflexion - right;Decreased dorsiflexion - left;Decreased stride length Gait velocity: decr Gait velocity interpretation: <1.31  ft/sec, indicative of household ambulator General Gait Details: Patient with slow, shuffling gait but no overt LOB  Stairs            Wheelchair Mobility    Modified Rankin (Stroke Patients Only)       Balance Overall balance assessment: Mild deficits observed, not formally tested                                           Pertinent Vitals/Pain Pain Assessment: No/denies pain    Home Living Family/patient expects to be discharged to:: Private residence Living Arrangements: Spouse/significant other Available Help at Discharge: Family;Available 24 hours/day Type of Home: House         Home Equipment: Kasandra Knudsen - single point      Prior Function Level of Independence: Needs assistance   Gait / Transfers Assistance Needed: needs additional assistance getting around unknown places other than home and church due to visual deficits. uses cane  ADL's / Homemaking Assistance Needed: independent        Hand Dominance        Extremity/Trunk Assessment   Upper Extremity Assessment Upper Extremity Assessment: Overall WFL for tasks assessed    Lower Extremity Assessment Lower Extremity Assessment: Generalized weakness    Cervical / Trunk Assessment Cervical / Trunk Assessment: Kyphotic  Communication   Communication: No difficulties  Cognition Arousal/Alertness: Awake/alert Behavior During Therapy: WFL for tasks assessed/performed Overall Cognitive Status: Within Functional Limits for tasks assessed  General Comments General comments (skin integrity, edema, etc.): pt family present    Exercises     Assessment/Plan    PT Assessment Patient needs continued PT services  PT Problem List Decreased strength;Decreased activity tolerance;Decreased balance;Decreased mobility       PT Treatment Interventions DME instruction;Stair training;Gait training;Functional mobility training;Therapeutic  activities;Balance training;Therapeutic exercise;Patient/family education    PT Goals (Current goals can be found in the Care Plan section)  Acute Rehab PT Goals Patient Stated Goal: "be a little faster." PT Goal Formulation: With patient Time For Goal Achievement: 11/13/17 Potential to Achieve Goals: Good    Frequency Min 3X/week   Barriers to discharge        Co-evaluation               AM-PAC PT "6 Clicks" Daily Activity  Outcome Measure Difficulty turning over in bed (including adjusting bedclothes, sheets and blankets)?: None Difficulty moving from lying on back to sitting on the side of the bed? : A Little Difficulty sitting down on and standing up from a chair with arms (e.g., wheelchair, bedside commode, etc,.)?: A Little Help needed moving to and from a bed to chair (including a wheelchair)?: A Little Help needed walking in hospital room?: A Little Help needed climbing 3-5 steps with a railing? : A Little 6 Click Score: 19    End of Session Equipment Utilized During Treatment: Gait belt Activity Tolerance: Patient tolerated treatment well Patient left: in chair;with call bell/phone within reach;with chair alarm set;with family/visitor present Nurse Communication: Mobility status PT Visit Diagnosis: Unsteadiness on feet (R26.81);Other abnormalities of gait and mobility (R26.89)    Time: 5573-2202 PT Time Calculation (min) (ACUTE ONLY): 22 min   Charges:   PT Evaluation $PT Eval Moderate Complexity: 1 Mod         Ellamae Sia, PT, DPT Acute Rehabilitation Services  Pager: [phone] 10/30/2017, 4:57 PM

## 2017-10-30 NOTE — Consult Note (Addendum)
Consultation  Referring Provider: [facility]/Opyd MD Primary Care Physician:  Curlene Labrum, MD Primary Gastroenterologist:  None/unassigned  Reason for Consultation:  Acute pancreatitis /elevated LFT's  HPI: [place] is a 84 y.o. male from [place], [place] who was admitted here early this morning after transfer from [facility]. Patient had presented to the emergency room with an episode of severe upper abdominal pain nausea and vomiting.  He is not a great historian but says that that was awful pain last evening which has since subsided.  His chart states that he had been having some upper abdominal pain off and on over the past week. Work-up in the ER with upper abdominal ultrasound showed the gallbladder to be mildly distended with diffuse gallbladder wall thickening and edema, there is sludge but no definite stones in the gallbladder and CBD of 5.4 mm with distal duct obscured. T bili 4.8, alk phos 296, AST 193, ALT of 204, lipase 1145, WBC of 13,000, hemoglobin 10 hematocrit of 30, creatinine 1.3.  Review of Care everywhere shows that he was seen by Dr. Tennis Must Mason/surgery in June 2019 for gallbladder disease.  He had had another admission prior to that visit and CT had shown a distended gallbladder with pericholecystic changes, HIDA scan showed delayed visualization.  Patient has history of atrial fibrillation, is on chronic Eliquis, prior TIA, legal blindness, hypothyroidism, and tachybradycardia syndrome status post pacemaker.  He says he feels fine this morning is not having any pain or nausea, feels that he could eat.   Past Medical History:  Diagnosis Date  . Chest pain    unspecified  . Chest pain, atypical   . Depression   . Depression   . Drug therapy    Coumadin Therapy  . Gastroesophageal reflux disease   . Gastroesophageal reflux disease   . Hypothyroidism   . Paroxysmal atrial fibrillation [facility]) May 2011  . Tachycardia-bradycardia syndrome ([place])      7 second pauses documented  . Transient ischemic attack    high risk for thromboembolic disease, secondary to atrial fibrillation, patient declines Coumadin due to prior history of severe epistaxis. Patient not willing to restart Coumadin therapy    Past Surgical History:  Procedure Laterality Date  . ABDOMINAL SURGERY     Abdominal diverticultis  . PACEMAKER INSERTION  07/06/12   MDT Adapta L implanted by Dr Caryl Comes  . PERMANENT PACEMAKER INSERTION N/A 07/06/2012   Procedure: PERMANENT PACEMAKER INSERTION;  Surgeon: Deboraha Sprang, MD;  Location: [facility] CATH LAB;  Service: Cardiovascular;  Laterality: N/A;    Prior to Admission medications   Medication Sig Start Date End Date Taking? Authorizing Provider  ALPRAZolam Duanne Moron) 0.25 MG tablet Take 1 tablet by mouth daily as needed. 04/03/16   [provider]  ELIQUIS 5 MG TABS tablet take 1 tablet by mouth twice a day 12/20/16   Arnoldo Lenis, MD  levothyroxine (SYNTHROID, LEVOTHROID) 75 MCG tablet Take 1 tablet by mouth daily. 04/30/16   [provider]  memantine (NAMENDA) 10 MG tablet Take 1 tablet by mouth 2 (two) times daily. 05/13/15   [provider]  metoprolol tartrate (LOPRESSOR) 25 MG tablet Take 0.5 tablets (12.5 mg total) 2 (two) times daily by mouth. 01/31/17   Branch, Alphonse Guild, MD  Multiple Vitamins-Minerals (OCUVITE PRESERVISION) TABS Take 1 tablet by mouth 2 (two) times daily.      [provider]  pantoprazole (PROTONIX) 40 MG tablet Take 1 tablet by mouth daily.  02/16/16   [provider]  simvastatin (ZOCOR) 20 MG tablet Take 20 mg by mouth at bedtime.      [provider]  tamsulosin (FLOMAX) 0.4 MG CAPS capsule Take 0.4 mg by mouth daily after supper.  10/13/12   [provider]    Current Facility-Administered Medications  Medication Dose Route Frequency Provider Last Rate Last Dose  . 0.9 %  sodium chloride infusion   Intravenous Continuous Opyd, Ilene Qua,  MD 90 mL/hr at 10/30/17 0124    . acetaminophen (TYLENOL) tablet 650 mg  650 mg Oral Q6H PRN Opyd, Ilene Qua, MD       Or  . acetaminophen (TYLENOL) suppository 650 mg  650 mg Rectal Q6H PRN Opyd, Ilene Qua, MD      . famotidine (PEPCID) IVPB 20 mg premix  20 mg Intravenous Q12H Opyd, Ilene Qua, MD 100 mL/hr at 10/30/17 0947 20 mg at 10/30/17 0947  . fentaNYL (SUBLIMAZE) injection 12.5-25 mcg  12.5-25 mcg Intravenous Q2H PRN Opyd, Ilene Qua, MD   12.5 mcg at 10/30/17 0503  . heparin ADULT infusion 100 units/mL (25000 units/24m sodium chloride 0.45%)  1,000 Units/hr Intravenous Continuous Opyd, TIlene Qua MD 10 mL/hr at 10/30/17 0442 1,000 Units/hr at 10/30/17 0442  . hydrALAZINE (APRESOLINE) injection 5 mg  5 mg Intravenous Q4H PRN Opyd, TIlene Qua MD      . LORazepam (ATIVAN) injection 0.5 mg  0.5 mg Intravenous Q6H PRN Opyd, TIlene Qua MD      . ondansetron (ZOFRAN) tablet 4 mg  4 mg Oral Q6H PRN Opyd, TIlene Qua MD       Or  . ondansetron (ZOFRAN) injection 4 mg  4 mg Intravenous Q6H PRN Opyd, TIlene Qua MD        Allergies as of 10/29/2017 - Review Complete 07/06/2016  Allergen Reaction Noted  . Warfarin sodium      Family History  Problem Relation Age of Onset  . Coronary artery disease Unknown   . Hyperlipidemia Unknown     Social History   Socioeconomic History  . Marital status: Married    Spouse name: Not on file  . Number of children: Not on file  . Years of education: Not on file  . Highest education level: Not on file  Occupational History  . Occupation: Retired  SScientific laboratory technician . Financial resource strain: Not on file  . Food insecurity:    Worry: Not on file    Inability: Not on file  . Transportation needs:    Medical: Not on file    Non-medical: Not on file  Tobacco Use  . Smoking status: Former Smoker    Packs/day: 0.50    Years: 10.00    Pack years: 5.00    Types: Cigarettes    Start date: 07/16/1931    Last attempt to quit: 07/15/1941    Years since  quitting: 76.3  . Smokeless tobacco: Former [employer] developer   [place] 03[place]2005  Substance and Sexual Activity  . Alcohol use: No    Alcohol/week: 0.0 standard drinks  . Drug use: No  . Sexual activity: Never  Lifestyle  . Physical activity:    Days per week: Not on file    Minutes per session: Not on file  . Stress: Not on file  Relationships  . Social connections:    Talks on phone: Not on file    Gets together: Not on file    Attends  religious service: Not on file    Active member of club or organization: Not on file    Attends meetings of clubs or organizations: Not on file    Relationship status: Not on file  . Intimate partner violence:    Fear of current or ex partner: Not on file    Emotionally abused: Not on file    Physically abused: Not on file    Forced sexual activity: Not on file  Other Topics Concern  . Not on file  Social History Narrative  . Not on file    Review of Systems: Pertinent positive and negative review of systems were noted in the above HPI section.  All other review of systems was otherwise negative.  Physical Exam: Vital signs in last 24 hours: Temp:  [98.6 F (37 C)] 98.6 F (37 C) (08/18 0506) Pulse Rate:  [69-106] 69 (08/18 0508) Resp:  [16] 16 (08/18 0506) BP: (82-99)/(43-50) 99/50 (08/18 0508) SpO2:  [91 %-96 %] 96 % (08/18 0508) Weight:  [75.3 kg] 75.3 kg (08/17 2350) Last BM Date: 10/29/17 General:   Alert,  Well-developed, thin elderly white male, jaundiced- well-nourished, pleasant and cooperative in NAD, hard of hearing Head:  Normocephalic and atraumatic. Eyes:  Sclera early icterus   conjunctiva pink. Ears:  Normal auditory acuity. Nose:  No deformity, discharge,  or lesions. Mouth:  No deformity or lesions.   Neck:  Supple; no masses or thyromegaly. Lungs:  Clear throughout to auscultation.   No wheezes, crackles, or rhonchi. Heart:  irRegular rate and rhythm; no murmurs, clicks, rubs,  or gallops. Abdomen:   Soft,nontender, BS active,nonpalp mass or hsm.   Rectal:  Deferred  Msk:  Symmetrical without gross deformities. . Pulses:  Normal pulses noted. Extremities:  Without clubbing or edema. Neurologic:  Alert and  oriented x4;  grossly normal neurologically. Skin:  Intact without significant lesions or rashes.  Early jaundice. Psych:  Alert and cooperative. Normal mood and affect.  Intake/Output from previous day: 08/17 0701 - 08/18 0700 In: 312.8 [I.V.:212.8; IV Piggyback:100] Out: -  Intake/Output this shift: No intake/output data recorded.  Lab Results: Recent Labs    10/30/17 0110  WBC 13.0*  HGB 10.0*  HCT 30.3*  PLT 144*   BMET Recent Labs    10/30/17 0110  NA 133*  K 4.4  CL 101  CO2 25  GLUCOSE 103*  BUN 17  CREATININE 1.34*  CALCIUM 8.4*   LFT Recent Labs    10/30/17 0110  PROT 5.3*  ALBUMIN 2.8*  AST 193*  ALT 204*  ALKPHOS 296*  BILITOT 4.8*   PT/INR No results for input(s): LABPROT, INR in the last 72 hours. Hepatitis Panel No results for input(s): HEPBSAG, HCVAB, HEPAIGM, HEPBIGM in the last 72 hours.    IMPRESSION:  #55 84 year old white male with acute biliary pancreatitis. Ultrasound does not show any definite stones, but he does have sludge. CBD is mildly dilated and LFTs are elevated, concerning for choledocholithiasis. Patient's pain has completely resolved, hopefully he has passed stone/sludge  #2 chronic cholecystitis with previous admission earlier this year and surgical consultation at outside facility for symptomatic gallbladder disease previous imaging also showing as of chronic cholecystitis.  #3 chronic anticoagulation-on Eliquis #4 atrial fibrillation #5 status post pacemaker/tachybradycardia syndrome #6 history of TIA #7 legal blindness #8 anemia normocytic  Recommendations: Start clear liquids Continue IV fluid hydration Repeat labs in a.m. Unfortunately patient is not a candidate for MRCP with pacemaker.  If he does  not have significant improvement in LFTs over the next 24 hours he will likely need ERCP   Hold Eliquis - being started on IV heparin  Start Unasyn 1.5 gm IV q 6  Surgical consult  Thank you we will follow with you   Amy Esterwood  10/30/2017, 10:04 AM      Attending physician's note   I have taken a history, examined the patient and reviewed the chart. I agree with the Advanced Practitioner's note, impression and recommendations.   Acute, biliary pancreatitis with improvement in symptoms since presentation yesterday. He may have passed a small stone or sludge.  Chronic cholecystitis.  Eliquis for afib on hold.   Not a candidate for MRCP with pacemaker.  Trend CMET, CBC, lipase.  IV fluids. IV Unasyn for cholecystitis.  Surgical consult. ? antibiotics, cholecystectomy, cholecystostomy tube Consider ERCP after Eliquis has cleared. If LFTs improve rapidly, c/w a stone or sludge passing, consider observation without ERCP.   Lucio Edward, MD FACG [phone] office

## 2017-10-30 NOTE — Progress Notes (Signed)
   10/30/17 1600  What Happened  Was fall witnessed? Yes  Who witnessed fall? Juve  Patients activity before fall other (comment) (sleeping)  Point of contact buttocks  Was patient injured? No  Follow Up  MD notified Fayrene Helper  Time MD notified  [phone])  Family notified Yes-comment Trinda Pascal)  Time family notified 1614  Additional tests No  Adult Fall Risk Assessment  Risk Factor Category (scoring not indicated) High fall risk per protocol (document High fall risk)  Age 84  Fall History: Fall within 6 months prior to admission 0  Elimination; Bowel and/or Urine Incontinence 0  Elimination; Bowel and/or Urine Urgency/Frequency 0  Medications: includes PCA/Opiates, Anti-convulsants, Anti-hypertensives, Diuretics, Hypnotics, Laxatives, Sedatives, and Psychotropics 3  Patient Care Equipment 2  Mobility-Assistance 2  Mobility-Gait 2  Mobility-Sensory Deficit 2  Altered awareness of immediate physical environment 0  Impulsiveness 0  Lack of understanding of one's physical/cognitive limitations 0  Total Score 14  Patient's Fall Risk High Fall Risk (>13 points)  Adult Fall Risk Interventions  Required Bundle Interventions *See Row Information* High fall risk - low, moderate, and high requirements implemented  Additional Interventions PT/OT need assessed if change in mobility from baseline  Screening for Fall Injury Risk (To be completed on HIGH fall risk patients) - Assessing Need for Low Bed  Risk For Fall Injury- Low Bed Criteria 85 years or older  Vitals  Temp 98.1 F (36.7 C)  Temp Source Oral  BP 126/68  BP Location Right Arm  BP Method Automatic  Patient Position (if appropriate) Lying  Pulse Rate 70  Pulse Rate Source Monitor  Resp 18  Oxygen Therapy  SpO2 98 %  O2 Device Room Air  Pain Assessment  Pain Scale 0-10  Neurological  Neuro (WDL) X  Level of Consciousness Alert  Orientation Level Oriented to person  Cognition Appropriate at baseline  Speech  Clear  Pupil Assessment  No  Motor Function/Sensation Assessment Grip;Dorsiflexion  R Hand Grip Moderate  L Hand Grip Moderate   R Foot Dorsiflexion Other (Comment) (not following commands)  L Foot Dorsiflexion Other (Comment) (not following commands)  R Foot Plantar Flexion Other (Comment) (not following commands)  L Foot Plantar Flexion Other (Comment) (not following commands)  Neuro Symptoms None  Musculoskeletal  Musculoskeletal (WDL) X  Assistive Device Standard walker  Generalized Weakness Yes  Weight Bearing Restrictions No  Integumentary  Integumentary (WDL) X  Skin Color Jaundice  Skin Condition Dry  Skin Integrity Intact  Skin Turgor Non-tenting   Pt attempted to get out of bed.  Pt with increased confusion different from am assessment by this nurse, after receiving pain medication.  MD is aware and will adjust dose.  Heparin gtt discontinued and sitter ordered to bedside.  No injuries noted, pt denies injury and pain at this time, will cont to monitor.   AKing BSNRN

## 2017-10-30 NOTE — Consult Note (Signed)
[facility] Surgery Consult Note  [name] 02-27-1924  [id number].    Requesting MD: Lucio Edward Chief Complaint/Reason for Consult: elevated LFTs  HPI:  Wesley Dyer is a 84yo male PMH Afib on Eliquis and tachybradycardia syndrome s/p pacer placement, who was transferred from [facility] to [facility] hospital early this morning with an episode of severe diffuse abdominal pain associated with nausea and vomiting. The pain started yesterday and was constant and severe. Worse with PO intake. Since admission he is feeling much better. Abdomen sore but pain improved. N/v resolved.  Work-up in the ED included upper abdominal ultrasound which showed the gallbladder to be mildly distended with diffuse gallbladder wall thickening and edema, sludge but no definite stones in the gallbladder and CBD of 5.4 mm with distal duct obscured. T bili 4.8, alk phos 296, AST 193, ALT of 204, lipase 1145, WBC of 13.  General surgery asked to see for consideration of cholecystectomy once pancreatitis resolves.  Of note patient was admitted at [facility] in June of this year with chronic cholecystitis; he was evaluated by Dr. Anthony Sar of surgery who did not recommend surgery at that time as the patient's pain resolved and due to high operative risks.   -PMH significant for Afib, h/o TIA, hypothyroidism, tachybradycardia syndrome s/p pacer -Abdominal surgical history: ex lap colectomy for diverticulitis -Anticoagulants: eliquis (last dose 8/17)  ROS: Review of Systems  Constitutional: Negative.   HENT: Negative.   Eyes: Negative.   Respiratory: Negative.   Cardiovascular: Negative.   Gastrointestinal: Positive for abdominal pain, nausea and vomiting.  Genitourinary: Negative.   Musculoskeletal: Negative.   Skin: Negative.   Neurological: Negative.     All systems reviewed and otherwise negative except for as above  Family History  Problem Relation Age of Onset  . Coronary artery disease  Unknown   . Hyperlipidemia Unknown     Past Medical History:  Diagnosis Date  . Chest pain    unspecified  . Chest pain, atypical   . Depression   . Depression   . Drug therapy    Coumadin Therapy  . Gastroesophageal reflux disease   . Gastroesophageal reflux disease   . Hypothyroidism   . Paroxysmal atrial fibrillation [facility]) May 2011  . Tachycardia-bradycardia syndrome ([place])    7 second pauses documented  . Transient ischemic attack    high risk for thromboembolic disease, secondary to atrial fibrillation, patient declines Coumadin due to prior history of severe epistaxis. Patient not willing to restart Coumadin therapy    Past Surgical History:  Procedure Laterality Date  . ABDOMINAL SURGERY     Abdominal diverticultis  . PACEMAKER INSERTION  07/06/12   MDT Adapta L implanted by Dr Caryl Comes  . PERMANENT PACEMAKER INSERTION N/A 07/06/2012   Procedure: PERMANENT PACEMAKER INSERTION;  Surgeon: Deboraha Sprang, MD;  Location: [facility] CATH LAB;  Service: Cardiovascular;  Laterality: N/A;    Social History:  reports that he quit smoking about 76 years ago. His smoking use included cigarettes. He started smoking about 86 years ago. He has a 5.00 pack-year smoking history. He quit smokeless tobacco use about 12 years ago.  His smokeless tobacco use included chew. He reports that he does not drink alcohol or use drugs.  Allergies:  Allergies  Allergen Reactions  . Warfarin Sodium     REACTION: bleeding    Medications Prior to Admission  Medication Sig Dispense Refill  . ALPRAZolam (XANAX) 0.25 MG tablet Take 1 tablet by mouth daily as  needed.    Marland Kitchen ELIQUIS 5 MG TABS tablet take 1 tablet by mouth twice a day 180 tablet 3  . levothyroxine (SYNTHROID, LEVOTHROID) 75 MCG tablet Take 1 tablet by mouth daily.    . memantine (NAMENDA) 10 MG tablet Take 1 tablet by mouth 2 (two) times daily.  0  . metoprolol tartrate (LOPRESSOR) 25 MG tablet Take 0.5 tablets (12.5 mg total) 2 (two) times daily  by mouth. 15 tablet 0  . Multiple Vitamins-Minerals (OCUVITE PRESERVISION) TABS Take 1 tablet by mouth 2 (two) times daily.      . pantoprazole (PROTONIX) 40 MG tablet Take 1 tablet by mouth daily.    . simvastatin (ZOCOR) 20 MG tablet Take 20 mg by mouth at bedtime.      . tamsulosin (FLOMAX) 0.4 MG CAPS capsule Take 0.4 mg by mouth daily after supper.       Prior to Admission medications   Medication Sig Start Date End Date Taking? Authorizing Provider  ALPRAZolam Duanne Moron) 0.25 MG tablet Take 1 tablet by mouth daily as needed. 04/03/16   [provider]  ELIQUIS 5 MG TABS tablet take 1 tablet by mouth twice a day 12/20/16   Arnoldo Lenis, MD  levothyroxine (SYNTHROID, LEVOTHROID) 75 MCG tablet Take 1 tablet by mouth daily. 04/30/16   [provider]  memantine (NAMENDA) 10 MG tablet Take 1 tablet by mouth 2 (two) times daily. 05/13/15   [provider]  metoprolol tartrate (LOPRESSOR) 25 MG tablet Take 0.5 tablets (12.5 mg total) 2 (two) times daily by mouth. 01/31/17   Branch, Alphonse Guild, MD  Multiple Vitamins-Minerals (OCUVITE PRESERVISION) TABS Take 1 tablet by mouth 2 (two) times daily.      [provider]  pantoprazole (PROTONIX) 40 MG tablet Take 1 tablet by mouth daily. 02/16/16   [provider]  simvastatin (ZOCOR) 20 MG tablet Take 20 mg by mouth at bedtime.      [provider]  tamsulosin (FLOMAX) 0.4 MG CAPS capsule Take 0.4 mg by mouth daily after supper.  10/13/12   [provider]    Blood pressure (!) 99/50, pulse 69, temperature 98.6 F (37 C), temperature source Oral, resp. rate 16, height 6' (1.829 m), weight 75.3 kg, SpO2 96 %. Physical Exam: General: pleasant, WD/WN white male who is laying in bed in NAD HEENT: head is normocephalic, atraumatic.  Sclera are noninjected.  Pupils equal and round.  Ears and nose without any masses or lesions.  Mouth is pink and moist. Dentition fair. +scleral icterus Heart:  regular, rate, and rhythm.  Palpable pedal pulses bilaterally Lungs: CTAB, no wheezes, rhonchi, or rales noted.  Respiratory effort nonlabored Abd: well healed midline incision, soft, NT/ND, +BS, no masses, hernias, or organomegaly MS: all 4 extremities are symmetrical with no cyanosis, clubbing, or edema. Skin: warm and dry with no masses, lesions, or rashes. +jaundice Psych: A&Ox3 with an appropriate affect. Neuro: cranial nerves grossly intact, extremity CSM intact bilaterally, normal speech  Results for orders placed or performed during the hospital encounter of 10/29/17 (from the past 48 hour(s))  Comprehensive metabolic panel     Status: Abnormal   Collection Time: 10/30/17  1:10 AM  Result Value Ref Range   Sodium 133 (L) 135 - 145 mmol/L   Potassium 4.4 3.5 - 5.1 mmol/L   Chloride 101 98 - 111 mmol/L   CO2 25 22 - 32 mmol/L   Glucose, Bld 103 (H) 70 - 99 mg/dL   BUN  17 8 - 23 mg/dL   Creatinine, Ser 1.34 (H) 0.61 - 1.24 mg/dL   Calcium 8.4 (L) 8.9 - 10.3 mg/dL   Total Protein 5.3 (L) 6.5 - 8.1 g/dL   Albumin 2.8 (L) 3.5 - 5.0 g/dL   AST 193 (H) 15 - 41 U/L   ALT 204 (H) 0 - 44 U/L   Alkaline Phosphatase 296 (H) 38 - 126 U/L   Total Bilirubin 4.8 (H) 0.3 - 1.2 mg/dL   GFR calc non Af Amer 44 (L) >60 mL/min   GFR calc Af Amer 51 (L) >60 mL/min    Comment: (NOTE) The eGFR has been calculated using the CKD EPI equation. This calculation has not been validated in all clinical situations. eGFR's persistently <60 mL/min signify possible Chronic Kidney Disease.    Anion gap 7 5 - 15    Comment: Performed at [facility] [address], [place], [place] [address]  CBC WITH DIFFERENTIAL     Status: Abnormal   Collection Time: 10/30/17  1:10 AM  Result Value Ref Range   WBC 13.0 (H) 4.0 - 10.5 K/uL   RBC 3.20 (L) 4.22 - 5.81 MIL/uL   Hemoglobin 10.0 (L) 13.0 - 17.0 g/dL   HCT 30.3 (L) 39.0 - 52.0 %   MCV 94.7 78.0 - 100.0 fL   MCH 31.3 26.0 - 34.0 pg   MCHC 33.0 30.0  - 36.0 g/dL   RDW 13.4 11.5 - 15.5 %   Platelets 144 (L) 150 - 400 K/uL   Neutrophils Relative % 86 %   Neutro Abs 11.3 (H) 1.7 - 7.7 K/uL   Lymphocytes Relative 5 %   Lymphs Abs 0.7 0.7 - 4.0 K/uL   Monocytes Relative 6 %   Monocytes Absolute 0.8 0.1 - 1.0 K/uL   Eosinophils Relative 2 %   Eosinophils Absolute 0.2 0.0 - 0.7 K/uL   Basophils Relative 0 %   Basophils Absolute 0.0 0.0 - 0.1 K/uL   Immature Granulocytes 1 %   Abs Immature Granulocytes 0.1 0.0 - 0.1 K/uL    Comment: Performed at [facility], [address] [place], [place] [address]  Lipase, blood     Status: Abnormal   Collection Time: 10/30/17  1:10 AM  Result Value Ref Range   Lipase 1,145 (H) 11 - 51 U/L    Comment: RESULTS CONFIRMED BY MANUAL DILUTION Performed at [facility], [place] [address] [place], [place] [address]   Triglycerides     Status: None   Collection Time: 10/30/17  1:10 AM  Result Value Ref Range   Triglycerides 31 <150 mg/dL    Comment: Performed at [place] [address], [place], [place] [address]  Glucose, capillary     Status: None   Collection Time: 10/30/17  7:57 AM  Result Value Ref Range   Glucose-Capillary 79 70 - 99 mg/dL   US Abdomen Limited Ruq  Result Date: 10/30/2017 CLINICAL DATA:  Acute pancreatitis. EXAM: ULTRASOUND ABDOMEN LIMITED RIGHT UPPER QUADRANT COMPARISON:  CT abdomen and pelvis 02/28/2007 FINDINGS: Gallbladder: Gallbladder is mildly distended with diffuse gallbladder wall thickening and edema. Small sludge demonstrated in the dependent gallbladder. No discrete stones. Murphy's sign is negative. Common bile duct: Diameter: 5.4 mm, normal. Distal bile duct is obscured by bowel gas. Liver: No focal lesion identified. Within normal limits in parenchymal echogenicity. Portal vein is patent on color Doppler imaging with normal direction of blood flow towards the liver.  IMPRESSION: Nonspecific finding with distended gallbladder, thickened and edematous  gallbladder wall, and gallbladder sludge but no stone and negative Murphy's sign. Changes could represent chronic inflammatory process. Consider radionuclide biliary scan to evaluate gallbladder function if clinically indicated. Electronically Signed   By: Lucienne Capers M.D.   On: 10/30/2017 03:43   Anti-infectives (From admission, onward)   Start     Dose/Rate Route Frequency Ordered Stop   10/30/17 1100  ampicillin-sulbactam (UNASYN) 1.5 g in sodium chloride 0.9 % 100 mL IVPB     1.5 g 200 mL/hr over 30 Minutes Intravenous Every 6 hours 10/30/17 1028          Assessment/Plan Afib - on eliquis, last dose 8/17 H/o TIA Hypothyroidism Tachybradycardia syndrome s/p pacer  Elevated LFTs, ?choledocholithasis Chronic cholecystitis Gallstone Pancreatitis - Patient with acute episode of abdominal pain that has now nearly resolved. Bilirubin elevated but hopefully he just passed a stone. Unable to perform MRCP due to pacemaker. Recommend trending LFTs and lipase. Started on clear liquids by GI.  Patient is not a very good surgical candidate due to age and cardiac risk factors. He seems to be improving so I would recommend continuing antibiotics for now and trend labs. Will continue to follow.  ID - unasyn 8/18>> VTE - SCDs, IV heparin FEN - IVF, CLD Foley - condem cath  Wellington Hampshire, [facility] Surgery 10/30/2017, 11:27 AM Pager: [phone] Consults: [phone] Mon 7:00 am -11:30 AM Tues-Fri 7:00 am-4:30 pm Sat-Sun 7:00 am-11:30 am

## 2017-10-30 NOTE — Progress Notes (Signed)
ANTICOAGULATION CONSULT NOTE - Follow-Up  Pharmacy Consult for Heparin Indication: atrial fibrillation  Patient Measurements: Height: 6' (182.9 cm) Weight: 166 lb (75.3 kg) IBW/kg (Calculated) : 77.6  Vital Signs: Temp: 98.6 F (37 C) (08/18 0506) Temp Source: Oral (08/18 0506) BP: 99/50 (08/18 0508) Pulse Rate: 69 (08/18 0508)  Labs: Recent Labs    10/30/17 0110  HGB 10.0*  HCT 30.3*  PLT 144*  CREATININE 1.34*    Estimated Creatinine Clearance: 37.5 mL/min (A) (by C-G formula based on SCr of 1.34 mg/dL (H)).   Medications:  Medications Prior to Admission  Medication Sig Dispense Refill Last Dose  . ALPRAZolam (XANAX) 0.25 MG tablet Take 1 tablet by mouth daily as needed.   Taking  . ELIQUIS 5 MG TABS tablet take 1 tablet by mouth twice a day 180 tablet 3   . levothyroxine (SYNTHROID, LEVOTHROID) 75 MCG tablet Take 1 tablet by mouth daily.   Taking  . memantine (NAMENDA) 10 MG tablet Take 1 tablet by mouth 2 (two) times daily.  0 Taking  . metoprolol tartrate (LOPRESSOR) 25 MG tablet Take 0.5 tablets (12.5 mg total) 2 (two) times daily by mouth. 15 tablet 0   . Multiple Vitamins-Minerals (OCUVITE PRESERVISION) TABS Take 1 tablet by mouth 2 (two) times daily.     Taking  . pantoprazole (PROTONIX) 40 MG tablet Take 1 tablet by mouth daily.   Taking  . simvastatin (ZOCOR) 20 MG tablet Take 20 mg by mouth at bedtime.     Taking  . tamsulosin (FLOMAX) 0.4 MG CAPS capsule Take 0.4 mg by mouth daily after supper.    Taking    Assessment: 89 YOM transferred to [facility] on 8/18 for management of acute pancreatitis. Planning ERCP. The patient was on apixaban PTA for history of atrial fibrillation - held and pharmacy consulted to bridge with Heparin.   APTT level this afternoon is SUPRAtherapeutic (aPTT 120, goal of 66-102). Heparin level this afternoon remains falsely elevated at 1.54 after recent apixaban use.   Goal of Therapy:  APTT 66-102 Heparin level 0.3-0.7  units/ml Monitor platelets by anticoagulation protocol: Yes   Plan:  - Reduce Heparin at 850 units/hr (8.5 ml/hr) - Daily HL/aPTT levels until correlated - Will continue to monitor for any signs/symptoms of bleeding and will follow up with aPTT in 8 hours   Thank you for allowing pharmacy to be a part of this patient's care.  Alycia Rossetti, PharmD, BCPS Clinical Pharmacist Pager: [phone] Clinical phone for 10/30/2017 from 7a-3:30p: [phone] If after 3:30p, please call main pharmacy at: [phone] Please check AMION for all [place] numbers 10/30/2017 1:40 PM

## 2017-10-30 NOTE — H&P (Signed)
History and Physical    Wesley Dyer ERX:[id number] DOB: 05-28-1923 DOA: 10/29/2017  PCP: Curlene Labrum, MD   Patient coming from: Home, by way of [facility]   Chief Complaint: Upper abdominal pain, nausea, vomiting   HPI: Wesley Dyer is a 84 y.o. male with medical history significant for paroxysmal atrial fibrillation on Eliquis, history of TIA, anxiety, hypothyroidism, and tachybradycardia syndrome with pacer, now presenting to the [facility] emergency department with reports of upper abdominal pain and nausea with vomiting.  Per ED documentation, the patient had been complaining of intermittent right upper quadrant pain for the past week or so, but constant pain for the past day.  He has had nausea and vomiting.  He was reportedly seen previously for gallbladder problems and was told that he was not a surgical candidate.  On arrival to [facility], patient denies any abdominal pain and has difficulty explaining why he went to the emergency department, reporting that "I had problems with my pressure but it is difficult to explain."  He also denies nausea and vomiting at this time.  On chart review, the patient had been seen by surgery in June of this year following hospitalization for upper abdominal pain with slight elevation in LFTs.  He had gallbladder distention on CT at that time, HIDA scan demonstrated delayed visualization, LFTs normalized, patient was treated conservatively, symptoms resolved, and he was discharged home.  Surgery did not feel that he was a surgical candidate.  He was asked to contact their office if the symptoms returned.  ED Course: Upon arrival to the ED, patient is found to be afebrile, saturating well on room air, and with vitals otherwise normal.  EKG features a sinus rhythm with PAC and LAD.  CT of the abdomen and pelvis reveals gallbladder distention without wall thickening or ductal dilation.  Chemistry panel was notable for a sodium of 132, alkaline  phosphatase 363, AST 303, ALT 269, and total bilirubin 3.1.  CBC features a mild normocytic anemia without leukocytosis.  Troponin is undetectable.  Lipase is elevated to ">3000."  Patient was given a liter of normal saline, IV Protonix, Zofran, and IV morphine in the ED.  Gastroenterologist at [facility] was consulted and agreed to see the patient here in consultation.  Admission to the hospitalist service at [facility] was arranged for ongoing evaluation and management.  Review of Systems:  Unable to complete ROS secondary to the patient's clinical condition.  Past Medical History:  Diagnosis Date  . Chest pain    unspecified  . Chest pain, atypical   . Depression   . Depression   . Drug therapy    Coumadin Therapy  . Gastroesophageal reflux disease   . Gastroesophageal reflux disease   . Hypothyroidism   . Paroxysmal atrial fibrillation [facility]) May 2011  . Tachycardia-bradycardia syndrome ([place])    7 second pauses documented  . Transient ischemic attack    high risk for thromboembolic disease, secondary to atrial fibrillation, patient declines Coumadin due to prior history of severe epistaxis. Patient not willing to restart Coumadin therapy    Past Surgical History:  Procedure Laterality Date  . ABDOMINAL SURGERY     Abdominal diverticultis  . PACEMAKER INSERTION  07/06/12   MDT Adapta L implanted by Dr Caryl Comes  . PERMANENT PACEMAKER INSERTION N/A 07/06/2012   Procedure: PERMANENT PACEMAKER INSERTION;  Surgeon: Deboraha Sprang, MD;  Location: [facility] CATH LAB;  Service: Cardiovascular;  Laterality: N/A;  reports that he quit smoking about 76 years ago. His smoking use included cigarettes. He started smoking about 86 years ago. He has a 5.00 pack-year smoking history. He quit smokeless tobacco use about 12 years ago.  His smokeless tobacco use included chew. He reports that he does not drink alcohol or use drugs.  Allergies  Allergen Reactions  . Warfarin Sodium     REACTION:  bleeding    Family History  Problem Relation Age of Onset  . Coronary artery disease Unknown   . Hyperlipidemia Unknown      Prior to Admission medications   Medication Sig Start Date End Date Taking? Authorizing Provider  ALPRAZolam Duanne Moron) 0.25 MG tablet Take 1 tablet by mouth daily as needed. 04/03/16   [provider]  ELIQUIS 5 MG TABS tablet take 1 tablet by mouth twice a day 12/20/16   Arnoldo Lenis, MD  levothyroxine (SYNTHROID, LEVOTHROID) 75 MCG tablet Take 1 tablet by mouth daily. 04/30/16   [provider]  memantine (NAMENDA) 10 MG tablet Take 1 tablet by mouth 2 (two) times daily. 05/13/15   [provider]  metoprolol tartrate (LOPRESSOR) 25 MG tablet Take 0.5 tablets (12.5 mg total) 2 (two) times daily by mouth. 01/31/17   Branch, Alphonse Guild, MD  Multiple Vitamins-Minerals (OCUVITE PRESERVISION) TABS Take 1 tablet by mouth 2 (two) times daily.      [provider]  pantoprazole (PROTONIX) 40 MG tablet Take 1 tablet by mouth daily. 02/16/16   [provider]  simvastatin (ZOCOR) 20 MG tablet Take 20 mg by mouth at bedtime.      [provider]  tamsulosin (FLOMAX) 0.4 MG CAPS capsule Take 0.4 mg by mouth daily after supper.  10/13/12   [provider]    Physical Exam: Vitals:   10/29/17 2350  Weight: 75.3 kg  Height: 6' (1.829 m)      Constitutional: NAD, calm  Eyes: PERTLA, lids and conjunctivae normal ENMT: Mucous membranes are moist. Posterior pharynx clear of any exudate or lesions.   Neck: normal, supple, no masses, no thyromegaly Respiratory: clear to auscultation bilaterally, no wheezing, no crackles. Normal respiratory effort.    Cardiovascular: S1 & S2 heard, regular rate and rhythm. Trace pretibial edema bilaterally. No significant JVD. Abdomen: No distension, soft, non-tender. Bowel sounds active.  Musculoskeletal: no clubbing / cyanosis. No joint deformity upper and lower extremities.      Skin: no significant rashes, lesions, ulcers. Warm, dry, well-perfused. Neurologic: Gross hearing deficit, gross vision deficits. Sensation intact. Moving all extremities.  Psychiatric: Alert and oriented to person only. Pleasant and cooperative.     Labs on Admission: I have personally reviewed following labs and imaging studies  CBC: No results for input(s): WBC, NEUTROABS, HGB, HCT, MCV, PLT in the last 168 hours. Basic Metabolic Panel: No results for input(s): NA, K, CL, CO2, GLUCOSE, BUN, CREATININE, CALCIUM, MG, PHOS in the last 168 hours. GFR: CrCl cannot be calculated (Patient's most recent lab result is older than the maximum 21 days allowed.). Liver Function Tests: No results for input(s): AST, ALT, ALKPHOS, BILITOT, PROT, ALBUMIN in the last 168 hours. No results for input(s): LIPASE, AMYLASE in the last 168 hours. No results for input(s): AMMONIA in the last 168 hours. Coagulation Profile: No results for input(s): INR, PROTIME in the last 168 hours. Cardiac Enzymes: No results for input(s): CKTOTAL, CKMB, CKMBINDEX, TROPONINI in the last 168 hours. BNP (last 3 results) No results for input(s): PROBNP in the  last 8760 hours. HbA1C: No results for input(s): HGBA1C in the last 72 hours. CBG: No results for input(s): GLUCAP in the last 168 hours. Lipid Profile: No results for input(s): CHOL, HDL, LDLCALC, TRIG, CHOLHDL, LDLDIRECT in the last 72 hours. Thyroid Function Tests: No results for input(s): TSH, T4TOTAL, FREET4, T3FREE, THYROIDAB in the last 72 hours. Anemia Panel: No results for input(s): VITAMINB12, FOLATE, FERRITIN, TIBC, IRON, RETICCTPCT in the last 72 hours. Urine analysis: No results found for: COLORURINE, APPEARANCEUR, LABSPEC, PHURINE, GLUCOSEU, HGBUR, BILIRUBINUR, KETONESUR, PROTEINUR, UROBILINOGEN, NITRITE, LEUKOCYTESUR Sepsis Labs: @LABRCNTIP (procalcitonin:4,lacticidven:4) )No results found for this or any previous visit (from the past 240 hour(s)).    Radiological Exams on Admission: No results found.  EKG: Independently reviewed. Sinus rhythm, PAC, LAD.   Assessment/Plan  1. Acute pancreatitis  - Reported upper abdominal pain with N/V in ED, but denies pain or nausea on admission  - He was found to have lipase ">3000" in the ED with transaminases in 300-range and total bilirubin of 3.1  - CT abd/pelvis demonstrated gallbladder distension without wall-thickening or ductal dilation  - GI is consulting and much appreciated, will follow up on recommendations  - Check RUQ [place] for stones, check triglyceride level, continue IVF hydration, bowel-rest, and pain-control    2. Tachy-brady syndrome   - Status-post pacer placement    3. Paroxysmal atrial fibrillation  - In sinus rhythm on admission  - CHADS-VASc 4 (age x2, TIA x2)  - Anticoagulated with Eliquis prior to admission; use IV heparin for now   4. Anxiety  - Continue low-dose benzodiazepine prn    5. Memory loss  - On bowel rest currently, resume memantine when appropriate    6. Hypothyroidism  - On bowel rest currently, resume Synthroid when appropriate     DVT prophylaxis: IV heparin infusion; Eliquis prior to admission  Code Status: Full  Family Communication: Discussed with patient   Consults called: GI  Admission status: Inpatient     Vianne Bulls, MD [facility] Pager [phone]  If 7PM-7AM, please contact night-coverage [email or web address] Password [facility]  10/30/2017, 12:43 AM

## 2017-10-31 ENCOUNTER — Inpatient Hospital Stay (HOSPITAL_COMMUNITY): Payer: MEDICARE

## 2017-10-31 DIAGNOSIS — R932 Abnormal findings on diagnostic imaging of liver and biliary tract: Secondary | ICD-10-CM

## 2017-10-31 LAB — COMPREHENSIVE METABOLIC PANEL
ALT: 126 U/L — ABNORMAL HIGH (ref 0–44)
AST: 100 U/L — ABNORMAL HIGH (ref 15–41)
Albumin: 2.7 g/dL — ABNORMAL LOW (ref 3.5–5.0)
Alkaline Phosphatase: 318 U/L — ABNORMAL HIGH (ref 38–126)
Anion gap: 5 (ref 5–15)
BILIRUBIN TOTAL: 6 mg/dL — AB (ref 0.3–1.2)
BUN: 18 mg/dL (ref 8–23)
CHLORIDE: 103 mmol/L (ref 98–111)
CO2: 26 mmol/L (ref 22–32)
CREATININE: 1.34 mg/dL — AB (ref 0.61–1.24)
Calcium: 8.3 mg/dL — ABNORMAL LOW (ref 8.9–10.3)
GFR, EST AFRICAN AMERICAN: 51 mL/min — AB (ref >60)
GFR, EST NON AFRICAN AMERICAN: 44 mL/min — AB (ref >60)
Glucose, Bld: 77 mg/dL (ref 70–99)
POTASSIUM: 3.9 mmol/L (ref 3.5–5.1)
Sodium: 134 mmol/L — ABNORMAL LOW (ref 135–145)
Total Protein: 5.2 g/dL — ABNORMAL LOW (ref 6.5–8.1)

## 2017-10-31 LAB — APTT: aPTT: 40 seconds — ABNORMAL HIGH (ref 24–36)

## 2017-10-31 LAB — C-REACTIVE PROTEIN: CRP: 11 mg/dL — AB (ref <1.0)

## 2017-10-31 LAB — CBC
HCT: 30.3 % — ABNORMAL LOW (ref 39.0–52.0)
Hemoglobin: 9.9 g/dL — ABNORMAL LOW (ref 13.0–17.0)
MCH: 31.1 pg (ref 26.0–34.0)
MCHC: 32.7 g/dL (ref 30.0–36.0)
MCV: 95.3 fL (ref 78.0–100.0)
PLATELETS: 132 10*3/uL — AB (ref 150–400)
RBC: 3.18 MIL/uL — ABNORMAL LOW (ref 4.22–5.81)
RDW: 13.5 % (ref 11.5–15.5)
WBC: 9.6 10*3/uL (ref 4.0–10.5)

## 2017-10-31 LAB — BILIRUBIN, DIRECT: BILIRUBIN DIRECT: 3.8 mg/dL — AB (ref 0.0–0.2)

## 2017-10-31 LAB — MAGNESIUM: Magnesium: 1.9 mg/dL (ref 1.7–2.4)

## 2017-10-31 LAB — HEPARIN LEVEL (UNFRACTIONATED): HEPARIN UNFRACTIONATED: 0.81 [IU]/mL — AB (ref 0.30–0.70)

## 2017-10-31 LAB — PHOSPHORUS: PHOSPHORUS: 3.2 mg/dL (ref 2.5–4.6)

## 2017-10-31 LAB — LIPASE, BLOOD: LIPASE: 380 U/L — AB (ref 11–51)

## 2017-10-31 LAB — GLUCOSE, CAPILLARY: GLUCOSE-CAPILLARY: 74 mg/dL (ref 70–99)

## 2017-10-31 MED ORDER — TRAZODONE HCL 50 MG PO TABS
25.0000 mg | ORAL_TABLET | Freq: Every day | ORAL | Status: DC
  Administered 2017-10-31 – 2017-11-02 (×3): 25 mg via ORAL
  Filled 2017-10-31 (×4): qty 1

## 2017-10-31 NOTE — Progress Notes (Signed)
Patient ID: Wesley Dyer, male   DOB: 07-Oct-1923, 84 y.o.   MRN: [id number]       Subjective: Pt laying in bed coughing up a lot of clear sputum.  He admits to minimal abdominal pain.  Patient tells me "I just want to go home." He then clarifies, "I just want to go be with the [place].  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  His wife is not present currently.  Objective: Vital signs in last 24 hours: Temp:  [98 F (36.7 C)-99.3 F (37.4 C)] 99 F (37.2 C) (08/19 0617) Pulse Rate:  [68-72] 69 (08/19 0617) Resp:  [16-22] 18 (08/19 0617) BP: (126-137)/(63-72) 137/72 (08/19 0617) SpO2:  [95 %-99 %] 99 % (08/19 0617) Weight:  [78.5 kg] 78.5 kg (08/19 0455) Last BM Date: 10/30/17  Intake/Output from previous day: 08/18 0701 - 08/19 0700 In: 1296.1 [I.V.:896.1; IV Piggyback:400] Out: 1300 [Urine:1300] Intake/Output this shift: No intake/output data recorded.  PE: Abd: soft, minimal epigastric tenderness, +BS, ND, midline scar from prior abdominal surgery (sigmoid colectomy for tics)  Lab Results:  Recent Labs    10/30/17 0110 10/31/17 0652  WBC 13.0* 9.6  HGB 10.0* 9.9*  HCT 30.3* 30.3*  PLT 144* 132*   BMET Recent Labs    10/30/17 0110 10/31/17 0652  NA 133* 134*  K 4.4 3.9  CL 101 103  CO2 25 26  GLUCOSE 103* 77  BUN 17 18  CREATININE 1.34* 1.34*  CALCIUM 8.4* 8.3*   PT/INR No results for input(s): LABPROT, INR in the last 72 hours. CMP     Component Value Date/Time   NA 134 (L) 10/31/2017 0652   K 3.9 10/31/2017 0652   CL 103 10/31/2017 0652   CO2 26 10/31/2017 0652   GLUCOSE 77 10/31/2017 0652   BUN 18 10/31/2017 0652   CREATININE 1.34 (H) 10/31/2017 0652   CALCIUM 8.3 (L) 10/31/2017 0652   PROT 5.2 (L) 10/31/2017 0652   ALBUMIN 2.7 (L) 10/31/2017 0652   AST 100 (H) 10/31/2017 0652   ALT 126 (H) 10/31/2017 0652   ALKPHOS  318 (H) 10/31/2017 0652   BILITOT 6.0 (H) 10/31/2017 0652   GFRNONAA 44 (L) 10/31/2017 0652   GFRAA 51 (L) 10/31/2017 0652   Lipase     Component Value Date/Time   LIPASE 380 (H) 10/31/2017 0652       Studies/Results: US Abdomen Limited Ruq  Result Date: 10/30/2017 CLINICAL DATA:  Acute pancreatitis. EXAM: ULTRASOUND ABDOMEN LIMITED RIGHT UPPER QUADRANT COMPARISON:  CT abdomen and pelvis 02/28/2007 FINDINGS: Gallbladder: Gallbladder is mildly distended with diffuse gallbladder wall thickening and edema. Small sludge demonstrated in the dependent gallbladder. No discrete stones. Murphy's sign is negative. Common bile duct: Diameter: 5.4 mm, normal. Distal bile duct is obscured by bowel gas. Liver: No focal lesion identified. Within normal limits in parenchymal echogenicity. Portal vein is patent on color Doppler imaging with normal direction of blood flow towards the liver. IMPRESSION: Nonspecific finding with distended gallbladder, thickened and edematous gallbladder wall, and gallbladder sludge but no stone and negative Murphy's sign. Changes could represent chronic inflammatory process. Consider radionuclide biliary scan to evaluate gallbladder function if clinically indicated. Electronically Signed   By: Lucienne Capers M.D.   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  Elevated LFTs, ?choledocholithasis Chronic cholecystitis Gallstone Pancreatitis - Patient with acute episode of abdominal pain that has  now nearly resolved.  -TB has increased today to 6.  Of note, in review of his imaging etc in PACs from even back in June, the patient had elevated LFTs at that time too that were being worked up.  He may have chronic choledocholithiasis? Now with gallstone pancreatitis from it. -he has had extensive abdominal surgery with some type of Billroth procedure, unknown cause, as well as sigmoid colectomy for diverticulitis.   Patient is not a very good surgical candidate due to age and cardiac risk factors (has extensive aortic atherosclerosis as well as mesenteric and pelvic vessel disease).  -patient does states "I just want to go home to be with the [place]."  Will need to discuss with wife about how aggressive she wants [place] to be with his care.  Surgery will very likely be a big undertaking for him and he has a risk of an open operation etc, and would likely never recover to even what his baseline is now. -we will follow, but he is certainly not a straightforward case and decision for surgery after reviewing his full medical history.  ID - unasyn 8/18>> VTE - SCDs, IV heparin FEN - IVF, CLD Foley - condem cath   LOS: 2 days    Henreitta Cea , [facility] Surgery 10/31/2017, 11:15 AM Pager: [phone]

## 2017-10-31 NOTE — H&P (View-Only) (Signed)
        Daily Rounding Note  10/31/2017, 9:36 AM  LOS: 2 days   SUBJECTIVE:   Still has pain in epigastric, RUQ area.  Retching during exam, bringing up sputum  Fell yesterday, heparin drip stopped.  Had received Fentanyl.  Sitter in room.  OBJECTIVE:         Vital signs in last 24 hours:    Temp:  [98 F (36.7 C)-99.3 F (37.4 C)] 99 F (37.2 C) (08/19 0617) Pulse Rate:  [68-72] 69 (08/19 0617) Resp:  [16-22] 18 (08/19 0617) BP: (126-137)/(63-72) 137/72 (08/19 0617) SpO2:  [95 %-99 %] 99 % (08/19 0617) Weight:  [78.5 kg] 78.5 kg (08/19 0455) Last BM Date: 10/30/17 Filed Weights   10/29/17 2350 10/31/17 0455  Weight: 75.3 kg 78.5 kg   General: frail, not toxic looking.     Heart: RRR.    Chest: coughing/gagging.  Producing small amount clear sputum.   Abdomen: soft, ND.  BS hypoactive.  Not tender  Extremities: no CCE Neuro/Psych:  Not oriented to place or date.  Follows commands  Intake/Output from previous day: 08/18 0701 - 08/19 0700 In: 1296.1 [I.V.:896.1; IV Piggyback:400] Out: 1300 [Urine:1300]  Intake/Output this shift: No intake/output data recorded.  Lab Results: Recent Labs    10/30/17 0110 10/31/17 0652  WBC 13.0* 9.6  HGB 10.0* 9.9*  HCT 30.3* 30.3*  PLT 144* 132*   BMET Recent Labs    10/30/17 0110 10/31/17 0652  NA 133* 134*  K 4.4 3.9  CL 101 103  CO2 25 26  GLUCOSE 103* 77  BUN 17 18  CREATININE 1.34* 1.34*  CALCIUM 8.4* 8.3*   LFT Recent Labs    10/30/17 0110 10/31/17 0652  PROT 5.3* 5.2*  ALBUMIN 2.8* 2.7*  AST 193* 100*  ALT 204* 126*  ALKPHOS 296* 318*  BILITOT 4.8* 6.0*  BILIDIR  --  3.8*   PT/INR No results for input(s): LABPROT, INR in the last 72 hours. Hepatitis Panel No results for input(s): HEPBSAG, HCVAB, HEPAIGM, HEPBIGM in the last 72 hours.  Studies/Results: Us Abdomen Limited Ruq  Result Date: 10/30/2017 CLINICAL DATA:  Acute pancreatitis. EXAM:  ULTRASOUND ABDOMEN LIMITED RIGHT UPPER QUADRANT COMPARISON:  CT abdomen and pelvis 02/28/2007 FINDINGS: Gallbladder: Gallbladder is mildly distended with diffuse gallbladder wall thickening and edema. Small sludge demonstrated in the dependent gallbladder. No discrete stones. Murphy's sign is negative. Common bile duct: Diameter: 5.4 mm, normal. Distal bile duct is obscured by bowel gas. Liver: No focal lesion identified. Within normal limits in parenchymal echogenicity. Portal vein is patent on color Doppler imaging with normal direction of blood flow towards the liver. IMPRESSION: Nonspecific finding with distended gallbladder, thickened and edematous gallbladder wall, and gallbladder sludge but no stone and negative Murphy's sign. Changes could represent chronic inflammatory process. Consider radionuclide biliary scan to evaluate gallbladder function if clinically indicated. Electronically Signed   By: William  Stevens M.D.   On: 10/30/2017 03:43    ASSESMENT:   *  Acute biliary pancreatitis.   Lipase 1145 >> 380 t bili and alk phos rising.  Transaminases dropping.  Leukocytosis resolved   *  Cholecystitis.  On Unasyn.    *   Chronic Eliquis for A fib, on hold.  Last dose 8/17 AM.     *  S/p pacemaker for tachy-brady, prevents MRCP.    *  Mild renal insufficiency.    *  Non critical thrombocytopenia (132).    *     Bilroth gastric anatomy on CT in [place].    *  Baseline dementia?? Vs s/e of meds, illness in aged pt.     PLAN   *  ERCP.  Aim for tomorrow.  Get 2nd ultrasound to see if CBD has become dilated and guide us as to need for EUS before ERCP.      Gustin Zobrist  10/31/2017, 9:36 AM Phone [phone] 

## 2017-10-31 NOTE — Anesthesia Preprocedure Evaluation (Addendum)
Anesthesia Evaluation  Patient identified by MRN, date of birth, ID band Patient awake    Reviewed: Allergy & Precautions, NPO status , Patient's Chart, lab work & pertinent test results  History of Anesthesia Complications Negative for: history of anesthetic complications  Airway Mallampati: II  TM Distance: >3 FB Neck ROM: Full    Dental no notable dental hx. (+) Teeth Intact, Dental Advisory Given   Pulmonary neg pulmonary ROS, former smoker,    Pulmonary exam normal breath sounds clear to auscultation       Cardiovascular hypertension, Pt. on home beta blockers and Pt. on medications negative cardio ROS Normal cardiovascular exam+ dysrhythmias (on eliquis, last dose 10/29/17) Atrial Fibrillation  Rhythm:Regular Rate:Normal  Pacemaker 2014   Neuro/Psych PSYCHIATRIC DISORDERS Anxiety Depression negative neurological ROS     GI/Hepatic negative GI ROS, GERD  Medicated and Controlled,Biliary pancreatitis   Endo/Other  negative endocrine ROSHypothyroidism   Renal/GU negative Renal ROS  negative genitourinary   Musculoskeletal negative musculoskeletal ROS (+)   Abdominal   Peds  Hematology negative hematology ROS (+)   Anesthesia Other Findings Day of surgery medications reviewed with the patient.  Reproductive/Obstetrics                           Anesthesia Physical Anesthesia Plan  ASA: III  Anesthesia Plan: General   Post-op Pain Management:    Induction: Intravenous  PONV Risk Score and Plan: 2 and Ondansetron and Dexamethasone  Airway Management Planned: Oral ETT  Additional Equipment:   Intra-op Plan:   Post-operative Plan: Extubation in OR  Informed Consent: I have reviewed the patients History and Physical, chart, labs and discussed the procedure including the risks, benefits and alternatives for the proposed anesthesia with the patient or authorized representative who  has indicated his/her understanding and acceptance.   Dental advisory given  Plan Discussed with: CRNA  Anesthesia Plan Comments:        Anesthesia Quick Evaluation

## 2017-10-31 NOTE — Progress Notes (Signed)
PROGRESS NOTE    [name]  BPZ:[id number] DOB: 11/23/1923 DOA: 10/29/2017 PCP: Curlene Labrum, MD   Brief Narrative: Wesley Dyer is Wesley Dyer 84 y.o. male with medical history significant for paroxysmal atrial fibrillation on Eliquis, history of TIA, anxiety, hypothyroidism, and tachybradycardia syndrome with pacer, now presenting to the [facility] emergency department with reports of upper abdominal pain and nausea with vomiting.  Per ED documentation, the patient had been complaining of intermittent right upper quadrant pain for the past week or so, but constant pain for the past day.  He has had nausea and vomiting.  He was reportedly seen previously for gallbladder problems and was told that he was not Wesley Dyer surgical candidate.  On arrival to [facility], patient denies any abdominal pain and has difficulty explaining why he went to the emergency department, reporting that "I had problems with my pressure but it is difficult to explain."  He also denies nausea and vomiting at this time.  On chart review, the patient had been seen by surgery in June of this year following hospitalization for upper abdominal pain with slight elevation in LFTs.  He had gallbladder distention on CT at that time, HIDA scan demonstrated delayed visualization, LFTs normalized, patient was treated conservatively, symptoms resolved, and he was discharged home.  Surgery did not feel that he was Johari Bennetts surgical candidate.  He was asked to contact their office if the symptoms returned.  Assessment & Plan:   Principal Problem:   Acute pancreatitis Active Problems:   AF (paroxysmal atrial fibrillation) (HCC)   Anxiety   Hypothyroidism   Memory loss   Essential hypertension   History of TIAs   Chronic cholecystitis without calculus   1. Gallstone Pancreatitis  Possible Choledocholithiasis - He was found to have lipase ">3000" in the ED with transaminases in 300-range and total bilirubin of 3.1 ->  Here lipase 1,145 and  AST/ALT 193/204 with bilirubin of 4.8 -> today lipase improved, AST/ALT improved.  Bilirubin and alk phos worsened. - CT abd/pelvis demonstrated gallbladder distension without wall-thickening or ductal dilation  - RUQ US showed distended gallbladder, thickened and edematous gallbladder wall and sludge, but no stone and negative murphy's - Pt unable to get MRCP with pacemaker - GI consult, appreciate recs -> start unasyn, clear liquids, IVF, considering ERCP - Surgery c/s, appreciate recs -> abx, trend labs, thought to be poor surgical candidate, follow - revised cardiac risk index is 2 for his hx of TIA and high risk surgery -> ~10% risk of death, MI, arrest.  Echo from 2013/06/03 with normal EF, grade 1 diastolic dysfunction.  # Goals of Care: apparently pt mentioned to surgery PA that he wanted to go home to be with the [place].  Pt is Wesley Dyer&Ox1 at this time.  Attempted to call wife to discuss further, but no answer.  Will continue to try to contact to discuss, but at this point, with rising bilirubin and persistent, I think if within family's goals would be appropriate to proceed as recommended by GI/surgery.  2. Tachy-brady syndrome   - Status-post pacer placement    3. Paroxysmal atrial fibrillation  - In sinus rhythm on admission  - CHADS-VASc 4 (age x2, TIA x2)  - Holding eliquis, was on heparin gtt, now d/c'd after pt fall below  # Fall on 8/18: pt with witnessed fall, helped to ground by tech.  No obvious injury.  No complaints of pain.  No head trauma.  No msk ttp on my exam.  Will get sitter, place delirium precautions, decrease fentanyl dose as this was thought to contribute.  4. Anxiety  - Continue low-dose benzodiazepine prn    6. Hypothyroidism  - On bowel rest currently, resume Synthroid when appropriate    # Thrombocytopenia: slightly worsened today, follow.  Suspect dilutional with all counts down.    # Anemia: relatively stable, follow  # Leukocytosis: resolved  # ? Dementia:  pt Wesley Dyer&ox1, suspect underlying dementia with likely delirium as well in setting of acute illness, follow. - resume memantine when able - delirium precautions  DVT prophylaxis: heparin Code Status: full  Family Communication: wife at bedside and then by phone after fall Disposition Plan: pending improvement   Consultants:   GI  Surgery  Procedures:   none  Antimicrobials:  Anti-infectives (From admission, onward)   Start     Dose/Rate Route Frequency Ordered Stop   10/30/17 1100  ampicillin-sulbactam (UNASYN) 1.5 g in sodium chloride 0.9 % 100 mL IVPB     1.5 g 200 mL/hr over 30 Minutes Intravenous Every 6 hours 10/30/17 1028       Subjective: C/o abdominal pain. Seems to be better.  Objective: Vitals:   10/30/17 1600 10/30/17 2222 10/31/17 0455 10/31/17 0617  BP: 126/68 127/63  137/72  Pulse: 70 68  69  Resp: 18 (!) 22  18  Temp: 98.1 F (36.7 C) 99.3 F (37.4 C)  99 F (37.2 C)  TempSrc: Oral Axillary  Oral  SpO2: 98% 95%  99%  Weight:   78.5 kg   Height:        Intake/Output Summary (Last 24 hours) at 10/31/2017 1014 Last data filed at 10/31/2017 0533 Gross per 24 hour  Intake 1296.1 ml  Output 1300 ml  Net -3.9 ml   Filed Weights   10/29/17 2350 10/31/17 0455  Weight: 75.3 kg 78.5 kg    Examination:  General: No acute distress. Cardiovascular: Heart sounds show Wesley Dyer regular rate, and rhythm. Lungs: Clear to auscultation bilaterally with good air movement.  Abdomen: Soft, pain seems less today, nondistended Neurological: Alert and oriented 1. Moves all extremities 4. Cranial nerves II through XII grossly intact. Skin: Warm and dry. No rashes or lesions. Extremities: No clubbing or cyanosis. No edema.  Psychiatric: Mood and affect are normal. Insight and judgment are appropriate.   Data Reviewed: I have personally reviewed following labs and imaging studies  CBC: Recent Labs  Lab 10/30/17 0110 10/31/17 0652  WBC 13.0* 9.6  NEUTROABS 11.3*   --   HGB 10.0* 9.9*  HCT 30.3* 30.3*  MCV 94.7 95.3  PLT 144* 102*   Basic Metabolic Panel: Recent Labs  Lab 10/30/17 0110 10/31/17 0652  NA 133* 134*  K 4.4 3.9  CL 101 103  CO2 25 26  GLUCOSE 103* 77  BUN 17 18  CREATININE 1.34* 1.34*  CALCIUM 8.4* 8.3*  MG  --  1.9  PHOS  --  3.2   GFR: Estimated Creatinine Clearance: 38.6 mL/min (Shaughnessy Gethers) (by C-G formula based on SCr of 1.34 mg/dL (H)). Liver Function Tests: Recent Labs  Lab 10/30/17 0110 10/31/17 0652  AST 193* 100*  ALT 204* 126*  ALKPHOS 296* 318*  BILITOT 4.8* 6.0*  PROT 5.3* 5.2*  ALBUMIN 2.8* 2.7*   Recent Labs  Lab 10/30/17 0110 10/31/17 0652  LIPASE 1,145* 380*   No results for input(s): AMMONIA in the last 168 hours. Coagulation Profile: No results for input(s): INR, PROTIME in the last 168 hours. Cardiac Enzymes:  No results for input(s): CKTOTAL, CKMB, CKMBINDEX, TROPONINI in the last 168 hours. BNP (last 3 results) No results for input(s): PROBNP in the last 8760 hours. HbA1C: No results for input(s): HGBA1C in the last 72 hours. CBG: Recent Labs  Lab 10/30/17 0757 10/31/17 0735  GLUCAP 79 74   Lipid Profile: Recent Labs    10/30/17 0110  TRIG 31   Thyroid Function Tests: No results for input(s): TSH, T4TOTAL, FREET4, T3FREE, THYROIDAB in the last 72 hours. Anemia Panel: No results for input(s): VITAMINB12, FOLATE, FERRITIN, TIBC, IRON, RETICCTPCT in the last 72 hours. Sepsis Labs: No results for input(s): PROCALCITON, LATICACIDVEN in the last 168 hours.  No results found for this or any previous visit (from the past 240 hour(s)).       Radiology Studies: US Abdomen Limited Ruq  Result Date: 10/30/2017 CLINICAL DATA:  Acute pancreatitis. EXAM: ULTRASOUND ABDOMEN LIMITED RIGHT UPPER QUADRANT COMPARISON:  CT abdomen and pelvis 02/28/2007 FINDINGS: Gallbladder: Gallbladder is mildly distended with diffuse gallbladder wall thickening and edema. Small sludge demonstrated in the  dependent gallbladder. No discrete stones. Murphy's sign is negative. Common bile duct: Diameter: 5.4 mm, normal. Distal bile duct is obscured by bowel gas. Liver: No focal lesion identified. Within normal limits in parenchymal echogenicity. Portal vein is patent on color Doppler imaging with normal direction of blood flow towards the liver. IMPRESSION: Nonspecific finding with distended gallbladder, thickened and edematous gallbladder wall, and gallbladder sludge but no stone and negative Murphy's sign. Changes could represent chronic inflammatory process. Consider radionuclide biliary scan to evaluate gallbladder function if clinically indicated. Electronically Signed   By: Lucienne Capers M.D.   On: 10/30/2017 03:43        Scheduled Meds: . heparin injection (subcutaneous)  5,000 Units Subcutaneous Q8H   Continuous Infusions: . ampicillin-sulbactam (UNASYN) IV 1.5 g (10/31/17 0533)  . famotidine (PEPCID) IV    . lactated ringers 100 mL/hr at 10/31/17 0600     LOS: 2 days    Time spent: over 30 min MDM moderate with multiple medical problems.   Fayrene Helper, MD [facility] Pager [phone]  If 7PM-7AM, please contact night-coverage [email or web address] Password [email or web address] 10/31/2017, 10:14 AM

## 2017-10-31 NOTE — Progress Notes (Addendum)
        Daily Rounding Note  10/31/2017, 9:36 AM  LOS: 2 days   SUBJECTIVE:   Still has pain in epigastric, RUQ area.  Retching during exam, bringing up sputum  Fell yesterday, heparin drip stopped.  Had received Fentanyl.  Sitter in room.  OBJECTIVE:         Vital signs in last 24 hours:    Temp:  [98 F (36.7 C)-99.3 F (37.4 C)] 99 F (37.2 C) (08/19 0617) Pulse Rate:  [68-72] 69 (08/19 0617) Resp:  [16-22] 18 (08/19 0617) BP: (126-137)/(63-72) 137/72 (08/19 0617) SpO2:  [95 %-99 %] 99 % (08/19 0617) Weight:  [78.5 kg] 78.5 kg (08/19 0455) Last BM Date: 10/30/17 Filed Weights   10/29/17 2350 10/31/17 0455  Weight: 75.3 kg 78.5 kg   General: frail, not toxic looking.     Heart: RRR.    Chest: coughing/gagging.  Producing small amount clear sputum.   Abdomen: soft, ND.  BS hypoactive.  Not tender  Extremities: no CCE Neuro/Psych:  Not oriented to place or date.  Follows commands  Intake/Output from previous day: 08/18 0701 - 08/19 0700 In: 1296.1 [I.V.:896.1; IV Piggyback:400] Out: 1300 [Urine:1300]  Intake/Output this shift: No intake/output data recorded.  Lab Results: Recent Labs    10/30/17 0110 10/31/17 0652  WBC 13.0* 9.6  HGB 10.0* 9.9*  HCT 30.3* 30.3*  PLT 144* 132*   BMET Recent Labs    10/30/17 0110 10/31/17 0652  NA 133* 134*  K 4.4 3.9  CL 101 103  CO2 25 26  GLUCOSE 103* 77  BUN 17 18  CREATININE 1.34* 1.34*  CALCIUM 8.4* 8.3*   LFT Recent Labs    10/30/17 0110 10/31/17 0652  PROT 5.3* 5.2*  ALBUMIN 2.8* 2.7*  AST 193* 100*  ALT 204* 126*  ALKPHOS 296* 318*  BILITOT 4.8* 6.0*  BILIDIR  --  3.8*   PT/INR No results for input(s): LABPROT, INR in the last 72 hours. Hepatitis Panel No results for input(s): HEPBSAG, HCVAB, HEPAIGM, HEPBIGM in the last 72 hours.  Studies/Results: Us Abdomen Limited Ruq  Result Date: 10/30/2017 CLINICAL DATA:  Acute pancreatitis. EXAM:  ULTRASOUND ABDOMEN LIMITED RIGHT UPPER QUADRANT COMPARISON:  CT abdomen and pelvis 02/28/2007 FINDINGS: Gallbladder: Gallbladder is mildly distended with diffuse gallbladder wall thickening and edema. Small sludge demonstrated in the dependent gallbladder. No discrete stones. Murphy's sign is negative. Common bile duct: Diameter: 5.4 mm, normal. Distal bile duct is obscured by bowel gas. Liver: No focal lesion identified. Within normal limits in parenchymal echogenicity. Portal vein is patent on color Doppler imaging with normal direction of blood flow towards the liver. IMPRESSION: Nonspecific finding with distended gallbladder, thickened and edematous gallbladder wall, and gallbladder sludge but no stone and negative Murphy's sign. Changes could represent chronic inflammatory process. Consider radionuclide biliary scan to evaluate gallbladder function if clinically indicated. Electronically Signed   By: William  Stevens M.D.   On: 10/30/2017 03:43    ASSESMENT:   *  Acute biliary pancreatitis.   Lipase 1145 >> 380 t bili and alk phos rising.  Transaminases dropping.  Leukocytosis resolved   *  Cholecystitis.  On Unasyn.    *   Chronic Eliquis for A fib, on hold.  Last dose 8/17 AM.     *  S/p pacemaker for tachy-brady, prevents MRCP.    *  Mild renal insufficiency.    *  Non critical thrombocytopenia (132).    *     Bilroth gastric anatomy on CT in [place].    *  Baseline dementia?? Vs s/e of meds, illness in aged pt.     PLAN   *  ERCP.  Aim for tomorrow.  Get 2nd ultrasound to see if CBD has become dilated and guide us as to need for EUS before ERCP.         10/31/2017, 9:36 AM Phone [phone] 

## 2017-11-01 ENCOUNTER — Inpatient Hospital Stay (HOSPITAL_COMMUNITY): Payer: MEDICARE | Admitting: Anesthesiology

## 2017-11-01 ENCOUNTER — Encounter (HOSPITAL_COMMUNITY)
Admission: AD | Disposition: A | Discharge status: Home Health | Payer: Self-pay | Source: Other Acute Inpatient Hospital | Attending: Family Medicine

## 2017-11-01 ENCOUNTER — Inpatient Hospital Stay (HOSPITAL_COMMUNITY): Payer: MEDICARE

## 2017-11-01 ENCOUNTER — Encounter (HOSPITAL_COMMUNITY): Payer: Self-pay | Admitting: Certified Registered"

## 2017-11-01 DIAGNOSIS — K838 Other specified diseases of biliary tract: Secondary | ICD-10-CM

## 2017-11-01 HISTORY — PX: ENDOSCOPIC RETROGRADE CHOLANGIOPANCREATOGRAPHY (ERCP) WITH PROPOFOL: SHX5810

## 2017-11-01 HISTORY — PX: SUBMUCOSAL INJECTION: SHX5543

## 2017-11-01 LAB — CBC
HCT: 28 % — ABNORMAL LOW (ref 39.0–52.0)
Hemoglobin: 9.2 g/dL — ABNORMAL LOW (ref 13.0–17.0)
MCH: 31.2 pg (ref 26.0–34.0)
MCHC: 32.9 g/dL (ref 30.0–36.0)
MCV: 94.9 fL (ref 78.0–100.0)
PLATELETS: 126 10*3/uL — AB (ref 150–400)
RBC: 2.95 MIL/uL — ABNORMAL LOW (ref 4.22–5.81)
RDW: 13.6 % (ref 11.5–15.5)
WBC: 12.1 10*3/uL — ABNORMAL HIGH (ref 4.0–10.5)

## 2017-11-01 LAB — BASIC METABOLIC PANEL
Anion gap: 5 (ref 5–15)
BUN: 18 mg/dL (ref 8–23)
CO2: 24 mmol/L (ref 22–32)
Calcium: 8 mg/dL — ABNORMAL LOW (ref 8.9–10.3)
Chloride: 105 mmol/L (ref 98–111)
Creatinine, Ser: 1.37 mg/dL — ABNORMAL HIGH (ref 0.61–1.24)
GFR calc Af Amer: 50 mL/min — ABNORMAL LOW (ref >60)
GFR, EST NON AFRICAN AMERICAN: 43 mL/min — AB (ref >60)
Glucose, Bld: 69 mg/dL — ABNORMAL LOW (ref 70–99)
POTASSIUM: 4 mmol/L (ref 3.5–5.1)
SODIUM: 134 mmol/L — AB (ref 135–145)

## 2017-11-01 LAB — HEPATIC FUNCTION PANEL
ALT: 83 U/L — ABNORMAL HIGH (ref 0–44)
AST: 52 U/L — ABNORMAL HIGH (ref 15–41)
Albumin: 2.4 g/dL — ABNORMAL LOW (ref 3.5–5.0)
Alkaline Phosphatase: 290 U/L — ABNORMAL HIGH (ref 38–126)
BILIRUBIN INDIRECT: 1.8 mg/dL — AB (ref 0.3–0.9)
Bilirubin, Direct: 1.2 mg/dL — ABNORMAL HIGH (ref 0.0–0.2)
TOTAL PROTEIN: 4.9 g/dL — AB (ref 6.5–8.1)
Total Bilirubin: 3 mg/dL — ABNORMAL HIGH (ref 0.3–1.2)

## 2017-11-01 LAB — GLUCOSE, CAPILLARY: GLUCOSE-CAPILLARY: 87 mg/dL (ref 70–99)

## 2017-11-01 LAB — LIPASE, BLOOD: LIPASE: 160 U/L — AB (ref 11–51)

## 2017-11-01 SURGERY — ENDOSCOPIC RETROGRADE CHOLANGIOPANCREATOGRAPHY (ERCP) WITH PROPOFOL
Anesthesia: General

## 2017-11-01 MED ORDER — ONDANSETRON HCL 4 MG/2ML IJ SOLN
INTRAMUSCULAR | Status: DC | PRN
  Administered 2017-11-01: 4 mg via INTRAVENOUS

## 2017-11-01 MED ORDER — PROPOFOL 10 MG/ML IV BOLUS
INTRAVENOUS | Status: DC | PRN
  Administered 2017-11-01: 100 mg via INTRAVENOUS

## 2017-11-01 MED ORDER — DEXAMETHASONE SODIUM PHOSPHATE 10 MG/ML IJ SOLN
INTRAMUSCULAR | Status: DC | PRN
  Administered 2017-11-01: 10 mg via INTRAVENOUS

## 2017-11-01 MED ORDER — ROCURONIUM BROMIDE 10 MG/ML (PF) SYRINGE
PREFILLED_SYRINGE | INTRAVENOUS | Status: DC | PRN
  Administered 2017-11-01: 50 mg via INTRAVENOUS

## 2017-11-01 MED ORDER — INDOMETHACIN 50 MG RE SUPP
RECTAL | Status: AC
  Filled 2017-11-01: qty 2

## 2017-11-01 MED ORDER — LIDOCAINE 2% (20 MG/ML) 5 ML SYRINGE
INTRAMUSCULAR | Status: DC | PRN
  Administered 2017-11-01: 80 mg via INTRAVENOUS

## 2017-11-01 MED ORDER — SODIUM CHLORIDE 0.9 % IV SOLN
INTRAVENOUS | Status: DC | PRN
  Administered 2017-11-01: 100 ug/min via INTRAVENOUS

## 2017-11-01 MED ORDER — IOPAMIDOL (ISOVUE-300) INJECTION 61%
INTRAVENOUS | Status: DC | PRN
  Administered 2017-11-01: 25 mL via INTRAVENOUS

## 2017-11-01 MED ORDER — INDOMETHACIN 50 MG RE SUPP: 100.0000 mg | Freq: Once | RECTAL | Status: DC

## 2017-11-01 MED ORDER — GLUCAGON HCL RDNA (DIAGNOSTIC) 1 MG IJ SOLR
INTRAMUSCULAR | Status: AC
  Filled 2017-11-01: qty 1

## 2017-11-01 MED ORDER — PROMETHAZINE HCL 25 MG/ML IJ SOLN: 6.2500 mg | INTRAMUSCULAR | Status: DC | PRN

## 2017-11-01 MED ORDER — SUGAMMADEX SODIUM 200 MG/2ML IV SOLN
INTRAVENOUS | Status: DC | PRN
  Administered 2017-11-01: 160 mg via INTRAVENOUS

## 2017-11-01 MED ORDER — INDOMETHACIN 50 MG RE SUPP
RECTAL | Status: DC | PRN
  Administered 2017-11-01: 100 mg via RECTAL

## 2017-11-01 MED ORDER — EPHEDRINE SULFATE-NACL 50-0.9 MG/10ML-% IV SOSY
PREFILLED_SYRINGE | INTRAVENOUS | Status: DC | PRN
  Administered 2017-11-01: 15 mg via INTRAVENOUS
  Administered 2017-11-01: 10 mg via INTRAVENOUS
  Administered 2017-11-01: 15 mg via INTRAVENOUS
  Administered 2017-11-01: 10 mg via INTRAVENOUS

## 2017-11-01 MED ORDER — FENTANYL CITRATE (PF) 100 MCG/2ML IJ SOLN: 25.0000 ug | INTRAMUSCULAR | Status: DC | PRN

## 2017-11-01 MED ORDER — IOPAMIDOL (ISOVUE-300) INJECTION 61%
INTRAVENOUS | Status: AC
  Filled 2017-11-01: qty 100

## 2017-11-01 MED ORDER — SPOT INK MARKER SYRINGE KIT
PACK | SUBMUCOSAL | Status: AC
  Filled 2017-11-01: qty 10

## 2017-11-01 MED ORDER — FENTANYL CITRATE (PF) 100 MCG/2ML IJ SOLN
INTRAMUSCULAR | Status: DC | PRN
  Administered 2017-11-01: 50 ug via INTRAVENOUS

## 2017-11-01 MED ORDER — PHENYLEPHRINE 40 MCG/ML (10ML) SYRINGE FOR IV PUSH (FOR BLOOD PRESSURE SUPPORT)
PREFILLED_SYRINGE | INTRAVENOUS | Status: DC | PRN
  Administered 2017-11-01: 160 ug via INTRAVENOUS
  Administered 2017-11-01: 80 ug via INTRAVENOUS
  Administered 2017-11-01 (×2): 120 ug via INTRAVENOUS
  Administered 2017-11-01: 200 ug via INTRAVENOUS
  Administered 2017-11-01: 120 ug via INTRAVENOUS
  Administered 2017-11-01: 200 ug via INTRAVENOUS
  Administered 2017-11-01: 120 ug via INTRAVENOUS
  Administered 2017-11-01: 80 ug via INTRAVENOUS
  Administered 2017-11-01: 200 ug via INTRAVENOUS
  Administered 2017-11-01 (×2): 120 ug via INTRAVENOUS
  Administered 2017-11-01: 200 ug via INTRAVENOUS
  Administered 2017-11-01: 120 ug via INTRAVENOUS
  Administered 2017-11-01: 200 ug via INTRAVENOUS
  Administered 2017-11-01: 120 ug via INTRAVENOUS
  Administered 2017-11-01: 80 ug via INTRAVENOUS
  Administered 2017-11-01: 120 ug via INTRAVENOUS
  Administered 2017-11-01: 200 ug via INTRAVENOUS
  Administered 2017-11-01: 120 ug via INTRAVENOUS

## 2017-11-01 NOTE — Transfer of Care (Signed)
Immediate Anesthesia Transfer of Care Note  Patient: Wesley Dyer  Procedure(s) Performed: ENDOSCOPIC RETROGRADE CHOLANGIOPANCREATOGRAPHY (ERCP) WITH PROPOFOL (N/A )  Patient Location: Endoscopy Unit  Anesthesia Type:General  Level of Consciousness: drowsy and responds to stimulation  Airway & Oxygen Therapy: Patient Spontanous Breathing and Patient connected to nasal cannula oxygen  Post-op Assessment: Report given to RN  Post vital signs: Reviewed and stable  Last Vitals:  Vitals Value Taken Time  BP    Temp    Pulse    Resp    SpO2      Last Pain:  Vitals:   11/01/17 0718  TempSrc: Oral  PainSc: 0-No pain      Patients Stated Pain Goal: 2 (40/08/67 6195)  Complications: No apparent anesthesia complications

## 2017-11-01 NOTE — Anesthesia Procedure Notes (Signed)
Procedure Name: Intubation Date/Time: 11/01/2017 7:40 AM Performed by: Barrington Ellison, CRNA Pre-anesthesia Checklist: Patient identified, Emergency Drugs available, Suction available and Patient being monitored Patient Re-evaluated:Patient Re-evaluated prior to induction Oxygen Delivery Method: Circle System Utilized Preoxygenation: Pre-oxygenation with 100% oxygen Induction Type: IV induction Ventilation: Mask ventilation without difficulty and Oral airway inserted - appropriate to patient size Laryngoscope Size: Mac and 4 Grade View: Grade I Tube type: Oral Tube size: 7.5 mm Number of attempts: 1 Airway Equipment and Method: Stylet and Oral airway Placement Confirmation: ETT inserted through vocal cords under direct vision,  positive ETCO2 and breath sounds checked- equal and bilateral Secured at: 22 cm Tube secured with: Tape Dental Injury: Teeth and Oropharynx as per pre-operative assessment

## 2017-11-01 NOTE — Interval H&P Note (Signed)
History and Physical Interval Note:  11/01/2017 7:19 AM  Wesley Dyer  has presented today for surgery, with the diagnosis of rising lfts.  billiary pancreatitis.  The various methods of treatment have been discussed with the patient and family. After consideration of risks, benefits and other options for treatment, the patient has consented to  Procedure(s): ENDOSCOPIC RETROGRADE CHOLANGIOPANCREATOGRAPHY (ERCP) WITH PROPOFOL (N/A) as a surgical intervention .  The patient's history has been reviewed, patient examined, no change in status, stable for surgery.  I have reviewed the patient's chart and labs.  Questions were answered to the patient's satisfaction.     [employer]

## 2017-11-01 NOTE — Progress Notes (Addendum)
Physical Therapy Treatment Patient Details Name: Wesley Dyer MRN: [id number] DOB: Feb 12, 1924 Today's Date: 11/01/2017    History of Present Illness Pt is a 84 y.o. M with significant PMH of paroxysmal atrial fibrillation on Eliquid, history of TIA, anxiety, hypothyroidism, and tachybradycardia syndrome with pacer, now presenting with reports of upper abdominal pain and nausea with vomiting.     PT Comments    Unfortunately, patient with significant regression in today's session compared to evaluation on 8/18. Currently, patient is A&Ox1 (oriented to self only) and confused, following simple commands intermittently. Demonstrates poor sitting and standing balance, requiring maximal assistance to transfer from bed to chair. Updating discharge plan to SNF to reflect patient's status as patient presents as a high fall risk. Ordered OT consult.    Follow Up Recommendations  SNF;Supervision/Assistance - 24 hour (could [facility] be possibility as well?)     Equipment Recommendations  None recommended by PT    Recommendations for Other Services       Precautions / Restrictions Precautions Precautions: Fall Restrictions Weight Bearing Restrictions: No    Mobility  Bed Mobility Overal bed mobility: Needs Assistance Bed Mobility: Supine to Sit     Supine to sit: Mod assist     General bed mobility comments: mod assist to elevate trunk  Transfers Overall transfer level: Needs assistance Equipment used: Rolling walker (2 wheeled) Transfers: Sit to/from [employer] Transfers Sit to Stand: Max assist Stand pivot transfers: Max assist;+2 safety/equipment       General transfer comment: Patient requiring max assist to transfer from bed to chair with heavy posterior lean and began to sit before chair was behind him  Ambulation/Gait                 Stairs             Wheelchair Mobility    Modified Rankin (Stroke Patients Only)       Balance  Overall balance assessment: Needs assistance Sitting-balance support: Bilateral upper extremity supported;Feet unsupported Sitting balance-Leahy Scale: Poor Sitting balance - Comments: When kicking legs up, demonstrated posterior lean, needing min-mod assist by PT to correct and return to upright     Standing balance-Leahy Scale: Poor                              Cognition Arousal/Alertness: Awake/alert Behavior During Therapy: WFL for tasks assessed/performed Overall Cognitive Status: Impaired/Different from baseline Area of Impairment: Orientation;Attention;Memory;Following commands;Safety/judgement;Awareness;Problem solving                 Orientation Level: Disoriented to;Place;Time;Situation Current Attention Level: Focused Memory: Decreased recall of precautions;Decreased short-term memory Following Commands: Follows one step commands inconsistently Safety/Judgement: Decreased awareness of safety;Decreased awareness of deficits Awareness: Intellectual Problem Solving: Decreased initiation;Difficulty sequencing;Requires verbal cues;Requires tactile cues        Exercises      General Comments        Pertinent Vitals/Pain      Home Living Family/patient expects to be discharged to:: Private residence Living Arrangements: Spouse/significant other Available Help at Discharge: Family;Available 24 hours/day Type of Home: House       Home Equipment: Kasandra Knudsen - single point      Prior Function Level of Independence: Needs assistance  Gait / Transfers Assistance Needed: needs additional assistance getting around unknown places other than home and church due to visual deficits. uses cane ADL's / Homemaking Assistance Needed: independent  PT Goals (current goals can now be found in the care plan section) Acute Rehab PT Goals Patient Stated Goal: "be a little faster." PT Goal Formulation: With patient Time For Goal Achievement: 11/13/17 Potential to  Achieve Goals: Good Progress towards PT goals: Not progressing toward goals - comment(see clinical impression)    Frequency    Min 2X/week      PT Plan Discharge plan needs to be updated;Frequency needs to be updated    Co-evaluation              AM-PAC PT "6 Clicks" Daily Activity  Outcome Measure  Difficulty turning over in bed (including adjusting bedclothes, sheets and blankets)?: None Difficulty moving from lying on back to sitting on the side of the bed? : A Little Difficulty sitting down on and standing up from a chair with arms (e.g., wheelchair, bedside commode, etc,.)?: A Little Help needed moving to and from a bed to chair (including a wheelchair)?: A Little Help needed walking in hospital room?: A Little Help needed climbing 3-5 steps with a railing? : A Little 6 Click Score: 19    End of Session Equipment Utilized During Treatment: Gait belt Activity Tolerance: Patient limited by fatigue Patient left: in chair;with call bell/phone within reach;with chair alarm set;with nursing/sitter in room Nurse Communication: Mobility status PT Visit Diagnosis: Unsteadiness on feet (R26.81);Other abnormalities of gait and mobility (R26.89)     Time: 5916-3846 PT Time Calculation (min) (ACUTE ONLY): 22 min  Charges:  $Therapeutic Activity: 8-22 mins          Ellamae Sia, PT, DPT Acute Rehabilitation Services  Pager: [place] 11/01/2017, 5:32 PM

## 2017-11-01 NOTE — Progress Notes (Signed)
Patients spouse and grandson was called this morning with no answer in regards to patients consent for ERCP today.

## 2017-11-01 NOTE — Progress Notes (Signed)
Patients spouse and grandson was called for the second attempt in regards to patients consent for ERCP today.

## 2017-11-01 NOTE — Progress Notes (Signed)
Patient ID: JAIVIAN BATTAGLINI, male   DOB: May 30, 1923, 84 y.o.   MRN: [id number]    Day of Surgery  Subjective: Patient sleeping hard.  Back from endo for about an hour or so.  Wife and pastor have left.    Objective: Vital signs in last 24 hours: Temp:  [97.8 F (36.6 C)-100.5 F (38.1 C)] 98.2 F (36.8 C) (08/20 1013) Pulse Rate:  [68-91] 68 (08/20 1256) Resp:  [15-23] 15 (08/20 1256) BP: (100-140)/(34-64) 106/55 (08/20 1256) SpO2:  [94 %-99 %] 95 % (08/20 1256) FiO2 (%):  [21 %] 21 % (08/20 0309) Last BM Date: 10/29/17  Intake/Output from previous day: 08/19 0701 - 08/20 0700 In: 1882.8 [P.O.:200; I.V.:1200; IV Piggyback:482.8] Out: 790 [Urine:790] Intake/Output this shift: Total I/O In: 750 [I.V.:750] Out: 0   PE: Heart: irregular Lungs: CTAB Abd: soft, sleeping and palpation doesn't seem to be wake him up or bother him, +BS, ND  Lab Results:  Recent Labs    10/31/17 0652 11/01/17 0623  WBC 9.6 12.1*  HGB 9.9* 9.2*  HCT 30.3* 28.0*  PLT 132* 126*   BMET Recent Labs    10/31/17 0652 11/01/17 0623  NA 134* 134*  K 3.9 4.0  CL 103 105  CO2 26 24  GLUCOSE 77 69*  BUN 18 18  CREATININE 1.34* 1.37*  CALCIUM 8.3* 8.0*   PT/INR No results for input(s): LABPROT, INR in the last 72 hours. CMP     Component Value Date/Time   NA 134 (L) 11/01/2017 0623   K 4.0 11/01/2017 0623   CL 105 11/01/2017 0623   CO2 24 11/01/2017 0623   GLUCOSE 69 (L) 11/01/2017 0623   BUN 18 11/01/2017 0623   CREATININE 1.37 (H) 11/01/2017 0623   CALCIUM 8.0 (L) 11/01/2017 0623   PROT 4.9 (L) 11/01/2017 0623   ALBUMIN 2.4 (L) 11/01/2017 0623   AST 52 (H) 11/01/2017 0623   ALT 83 (H) 11/01/2017 0623   ALKPHOS 290 (H) 11/01/2017 0623   BILITOT 3.0 (H) 11/01/2017 0623   GFRNONAA 43 (L) 11/01/2017 0623   GFRAA 50 (L) 11/01/2017 0623   Lipase     Component Value Date/Time   LIPASE 160 (H) 11/01/2017 0623       Studies/Results: US Abdomen Limited  Result Date:  10/31/2017 CLINICAL DATA:  Elevated liver function tests. EXAM: ULTRASOUND ABDOMEN LIMITED RIGHT UPPER QUADRANT COMPARISON:  10/30/2017 FINDINGS: Gallbladder: Similar appearance of mildly distended gallbladder with wall thickening (7 mm thickness) and small volume sludge. No shadowing gallstones. No sonographic Murphy sign noted by sonographer. Common bile duct: Diameter: 4 mm Liver: No focal lesion identified. Within normal limits in parenchymal echogenicity. Portal vein is patent on color Doppler imaging with normal direction of blood flow towards the liver. IMPRESSION: Distended gallbladder with wall thickening and small volume sludge, unchanged from yesterday's study. No shadowing stones or biliary dilatation. Electronically Signed   By: Logan Bores M.D.   On: 10/31/2017 14:43   Dg Ercp  Result Date: 11/01/2017 CLINICAL DATA:  Pancreatitis, abnormal liver function tests. EXAM: ERCP TECHNIQUE: Multiple spot images obtained with the fluoroscopic device and submitted for interpretation post-procedure. COMPARISON:  Right upper quadrant ultrasound on 10/31/2017 FINDINGS: Images were obtained with a C-arm that show advancement of an endoscope and guidewire without injection of contrast material. One image does show advancement of a guidewire. IMPRESSION: No injection of contrast on the submitted images. These images were submitted for radiologic interpretation only. Please see the procedural report for  the amount of contrast and the fluoroscopy time utilized. Electronically Signed   By: Aletta Edouard M.D.   On: 11/01/2017 11:18    Anti-infectives: Anti-infectives (From admission, onward)   Start     Dose/Rate Route Frequency Ordered Stop   10/30/17 1100  ampicillin-sulbactam (UNASYN) 1.5 g in sodium chloride 0.9 % 100 mL IVPB     1.5 g 200 mL/hr over 30 Minutes Intravenous Every 6 hours 10/30/17 1028         Assessment/Plan Afib - on eliquis, last dose 8/17 H/o  TIA Hypothyroidism Tachybradycardia syndrome s/p pacer History of diverticulitis, s/p sigmoid colectomy History of [place], possibly II, for unknown reason.  Elevated LFTs, ?choledocholithasis Chronic cholecystitis Gallstone Pancreatitis - ERCP attempted today for about 3 hrs.  Due to his prior BII procedure it was very difficult.  Dr. Rush Landmark was able to get contrast to fill the pancreatic duct, but the rest of the tree was unable to be opacified.   -TB is down to 3 today and direct bilirubin down from 3.8 to 1.2 today.  This certainly could indicate that maybe he has past a stone, but is not 100%. -couple options moving forward would be lap chole with possible IOC; however, patient is very high surgical risk and his expected outcome would certainly be a worse baseline than his current state.  Could not guarantee a lap procedure given previous surgery which may require an open operation which is even higher risk.  2nd option may be perc chole drain placement.  US shows some distention and wall thickening, but unclear if the patient has cholecystitis.  If he does not have cholecystitis, drain may not be beneficial as it won't help if he truly still has choledocholithiasis; however, it may if he were to have cholecystitis and then a cholangiogram could be obtained down the road.  3rd option is to not do anything.  If he doesn't have cholecystitis and his stone has past, then he may not require any intervention currently.  Only issue with this is he could develop recurrent pancreatitis in the future.  However, we don't know for sure that he still doesn't have a stone, etc.   -a HIDA scan may be beneficial currently as it may help [place] delineate if he has cholecystitis and even whether he may have a retained CBD stone.  This information may be able to help [place] determine how best to proceed with his care.  I have spoken to his wife, Ilona Sorrel, as well as their pastor, Lysle Pearl, of whom the family wants [place]  to contac,t and discuss things with as well.  We spoke on the phone for over 30 minutes today. They don't know the best decision for the patient currently.  I will discuss with Dr. Hulen Skains about the possibility of a HIDA scan to see if that will help give more information for further care moving forward.  ID -unasyn 8/18>> VTE -SCDs, IV heparin FEN -IVF, CLD Foley -condem cath   LOS: 3 days    Henreitta Cea , [facility] Surgery 11/01/2017, 2:06 PM Pager: [phone]

## 2017-11-01 NOTE — Progress Notes (Signed)
PROGRESS NOTE    [name]  XKP:[id number] DOB: August 16, 1923 DOA: 10/29/2017 PCP: Curlene Labrum, MD   Brief Narrative: Wesley Dyer is a 84 y.o. male with medical history significant for paroxysmal atrial fibrillation on Eliquis, history of TIA, anxiety, hypothyroidism, and tachybradycardia syndrome with pacer, now presenting to the [facility] emergency department with reports of upper abdominal pain and nausea with vomiting.  Per ED documentation, the patient had been complaining of intermittent right upper quadrant pain for the past week or so, but constant pain for the past day.  He has had nausea and vomiting.  He was reportedly seen previously for gallbladder problems and was told that he was not a surgical candidate.  On arrival to [facility], patient denies any abdominal pain and has difficulty explaining why he went to the emergency department, reporting that "I had problems with my pressure but it is difficult to explain."  He also denies nausea and vomiting at this time.  On chart review, the patient had been seen by surgery in June of this year following hospitalization for upper abdominal pain with slight elevation in LFTs.  He had gallbladder distention on CT at that time, HIDA scan demonstrated delayed visualization, LFTs normalized, patient was treated conservatively, symptoms resolved, and he was discharged home.  Surgery did not feel that he was a surgical candidate.  He was asked to contact their office if the symptoms returned.  Assessment & Plan:   Principal Problem:   Acute pancreatitis Active Problems:   AF (paroxysmal atrial fibrillation) (HCC)   Anxiety   Hypothyroidism   Memory loss   Essential hypertension   History of TIAs   Chronic cholecystitis without calculus   1. Gallstone Pancreatitis  Possible Choledocholithiasis - He was found to have lipase ">3000" in the ED with transaminases in 300-range and total bilirubin of 3.1 ->  Here lipase 1,145 and  AST/ALT 193/204 with bilirubin of 4.8 -> lipase, AST/ALT, bilirubin, and alk phos improved today - CT abd/pelvis demonstrated gallbladder distension without wall-thickening or ductal dilation  - RUQ US showed distended gallbladder, thickened and edematous gallbladder wall and sludge, but no stone and negative murphy's - Repeat US unchanged - Pt unable to get MRCP with pacemaker - GI consult, appreciate recs -> start unasyn, clear liquids, IVF, now s/p ERCP 8/20 (see proc note) - recommending cholecystectomy with IOP vs perc chole tube and eventual cholecystogram.  Follow LFT's.Consider transfer to tertiary referral center if concern for biliary obstruction. - Surgery c/s, appreciate recs -> abx, trend labs, thought to be poor surgical candidate, planning to discuss with family.   - revised cardiac risk index is 2 for his hx of TIA and high risk surgery -> ~10% risk of death, MI, arrest.  Echo from 05/26/2013 with normal EF, grade 1 diastolic dysfunction.      # Goals of Care: apparently pt mentioned to surgery PA that he wanted to go home to be with the lord.  Pt was A&Ox1 at the time.  I've been unable to reach family to discuss [place] since that time (called and left messages yesterday and today), but pt underwent ERCP today.  Surgery planning to discuss next steps in care as well.     2. Tachy-brady syndrome   - Status-post pacer placement    3. Paroxysmal atrial fibrillation  - In sinus rhythm on admission  - CHADS-VASc 4 (age x2, TIA x2)  - Holding eliquis, was on heparin gtt, now d/c'd after pt  fall below  # Fall on 8/18: pt with witnessed fall, helped to ground by tech.  No obvious injury.  No complaints of pain.  No head trauma.  No msk ttp on my exam.  Will get sitter, place delirium precautions, decrease fentanyl dose as this was thought to contribute.  4. Anxiety  - Continue low-dose benzodiazepine prn    6. Hypothyroidism  - On bowel rest currently, resume Synthroid when appropriate      # Thrombocytopenia: Pt presented with thrombocytopenia with slight downtrend daily.  Follow hepatitis panel/HIV.  Continue to monitor daily.  Possible component of dilution.    # Anemia: downtrending.  No si/sx bleeding.  He's been receiving continuous IVF and may be dilutional component.   # Leukocytosis: worsened today, follow, continue abx.  # ? Dementia: pt a&ox1, suspect underlying dementia with likely delirium as well in setting of acute illness, follow. - resume memantine when able - delirium precautions  DVT prophylaxis: heparin Code Status: full  Family Communication: none at bedside, called and left message for family. Disposition Plan: pending improvement   Consultants:   GI  Surgery  Procedures:  ERCP 8/20 - No gross lesions in esophagus. - Patent Billroth II gastrojejunostomy was found, characterized by congestion, edema and erythema. - The major papilla appeared normal. - Attempts at biliary cannulation were not successful as outlined above. - Partial ampullary injection of contrast was done to try and outline biliary tree but only entered into the pancreatic duct and was superficially injected. A wire could not be placed within the duct. - To demarcate the afferent limb for possible future intervention, an area within the gastrojejunostomy was successfully injected. Impression: - The patient will be observed post-procedure, until all discharge criteria are met. - Return patient to hospital ward for ongoing care. - Observe patient's clinical course. - Will discuss case with our Medical and Surgical colleagues. As patient's liver biochemical profile has continued to improve into today's labs (returned during procedure), it would be reasonable to consider either a cholecystectomy with intraoperative cholangiogram vs a Percutaneous cholecystostomy tube placement and eventual cholecystogram to see if the CBD can be evaluated. His LFT pattern can be monitored  closely and if normalization with either of those being done, then no further action would be required. However, if there remained consider for biliary obstruction, I would recommend transfer to a tertiary referral center for further attempt at biliary cannulation. - Watch for pancreatitis, bleeding, perforation, and cholangitis. - Check liver enzymes (AST, ALT, alkaline phosphatase, bilirubin) in the morning. - The findings and recommendations were discussed with the patient. - The findings and recommendations were discussed with the patient's family. - The findings and recommendations were discussed with the designated responsible adult. - The findings and recommendations were discussed with the referring physician.  Antimicrobials:  Anti-infectives (From admission, onward)   Start     Dose/Rate Route Frequency Ordered Stop   10/30/17 1100  ampicillin-sulbactam (UNASYN) 1.5 g in sodium chloride 0.9 % 100 mL IVPB     1.5 g 200 mL/hr over 30 Minutes Intravenous Every 6 hours 10/30/17 1028       Subjective: Sleepy after procedure.  Objective: Vitals:   11/01/17 1030 11/01/17 1040 11/01/17 1048 11/01/17 1256  BP: (!) 109/38 (!) 107/39 (!) 110/39 (!) 106/55  Pulse: 79 74 76 68  Resp: (!) 23 18 (!) 21 15  Temp:      TempSrc:      SpO2: 99% 95% 94% 95%  Weight:  Height:        Intake/Output Summary (Last 24 hours) at 11/01/2017 1329 Last data filed at 11/01/2017 1010 Gross per 24 hour  Intake 2632.84 ml  Output 790 ml  Net 1842.84 ml   Filed Weights   10/29/17 2350 10/31/17 0455  Weight: 75.3 kg 78.5 kg    Examination:  General: No acute distress. Cardiovascular: Heart sounds show a regular rate, and rhythm.  Lungs: Clear to auscultation bilaterally with good air movement. Abdomen: Soft, nontender, nondistended  Neurological: Sleepy after procedure. Moves all extremities 4. Cranial nerves II through XII grossly intact. Skin: Warm and dry. No rashes or  lesions. Extremities: No clubbing or cyanosis. No edema.     Data Reviewed: I have personally reviewed following labs and imaging studies  CBC: Recent Labs  Lab 10/30/17 0110 10/31/17 0652 11/01/17 0623  WBC 13.0* 9.6 12.1*  NEUTROABS 11.3*  --   --   HGB 10.0* 9.9* 9.2*  HCT 30.3* 30.3* 28.0*  MCV 94.7 95.3 94.9  PLT 144* 132* 397*   Basic Metabolic Panel: Recent Labs  Lab 10/30/17 0110 10/31/17 0652 11/01/17 0623  NA 133* 134* 134*  K 4.4 3.9 4.0  CL 101 103 105  CO2 '25 26 24  ' GLUCOSE 103* 77 69*  BUN '17 18 18  ' CREATININE 1.34* 1.34* 1.37*  CALCIUM 8.4* 8.3* 8.0*  MG  --  1.9  --   PHOS  --  3.2  --    GFR: Estimated Creatinine Clearance: 37.8 mL/min (A) (by C-G formula based on SCr of 1.37 mg/dL (H)). Liver Function Tests: Recent Labs  Lab 10/30/17 0110 10/31/17 0652 11/01/17 0623  AST 193* 100* 52*  ALT 204* 126* 83*  ALKPHOS 296* 318* 290*  BILITOT 4.8* 6.0* 3.0*  PROT 5.3* 5.2* 4.9*  ALBUMIN 2.8* 2.7* 2.4*   Recent Labs  Lab 10/30/17 0110 10/31/17 0652 11/01/17 0623  LIPASE 1,145* 380* 160*   No results for input(s): AMMONIA in the last 168 hours. Coagulation Profile: No results for input(s): INR, PROTIME in the last 168 hours. Cardiac Enzymes: No results for input(s): CKTOTAL, CKMB, CKMBINDEX, TROPONINI in the last 168 hours. BNP (last 3 results) No results for input(s): PROBNP in the last 8760 hours. HbA1C: No results for input(s): HGBA1C in the last 72 hours. CBG: Recent Labs  Lab 10/30/17 0757 10/31/17 0735 11/01/17 1200  GLUCAP 79 74 87   Lipid Profile: Recent Labs    10/30/17 0110  TRIG 31   Thyroid Function Tests: No results for input(s): TSH, T4TOTAL, FREET4, T3FREE, THYROIDAB in the last 72 hours. Anemia Panel: No results for input(s): VITAMINB12, FOLATE, FERRITIN, TIBC, IRON, RETICCTPCT in the last 72 hours. Sepsis Labs: No results for input(s): PROCALCITON, LATICACIDVEN in the last 168 hours.  No results found  for this or any previous visit (from the past 240 hour(s)).       Radiology Studies: US Abdomen Limited  Result Date: 10/31/2017 CLINICAL DATA:  Elevated liver function tests. EXAM: ULTRASOUND ABDOMEN LIMITED RIGHT UPPER QUADRANT COMPARISON:  10/30/2017 FINDINGS: Gallbladder: Similar appearance of mildly distended gallbladder with wall thickening (7 mm thickness) and small volume sludge. No shadowing gallstones. No sonographic Murphy sign noted by sonographer. Common bile duct: Diameter: 4 mm Liver: No focal lesion identified. Within normal limits in parenchymal echogenicity. Portal vein is patent on color Doppler imaging with normal direction of blood flow towards the liver. IMPRESSION: Distended gallbladder with wall thickening and small volume sludge, unchanged from yesterday's study. No  shadowing stones or biliary dilatation. Electronically Signed   By: Logan Bores M.D.   On: 10/31/2017 14:43   Dg Ercp  Result Date: 11/01/2017 CLINICAL DATA:  Pancreatitis, abnormal liver function tests. EXAM: ERCP TECHNIQUE: Multiple spot images obtained with the fluoroscopic device and submitted for interpretation post-procedure. COMPARISON:  Right upper quadrant ultrasound on 10/31/2017 FINDINGS: Images were obtained with a C-arm that show advancement of an endoscope and guidewire without injection of contrast material. One image does show advancement of a guidewire. IMPRESSION: No injection of contrast on the submitted images. These images were submitted for radiologic interpretation only. Please see the procedural report for the amount of contrast and the fluoroscopy time utilized. Electronically Signed   By: Aletta Edouard M.D.   On: 11/01/2017 11:18        Scheduled Meds: . heparin injection (subcutaneous)  5,000 Units Subcutaneous Q8H  . indomethacin  100 mg Rectal Once  . traZODone  25 mg Oral QHS   Continuous Infusions: . ampicillin-sulbactam (UNASYN) IV 1.5 g (11/01/17 1146)  . famotidine  (PEPCID) IV 20 mg (11/01/17 1154)  . lactated ringers 100 mL/hr at 10/31/17 1409     LOS: 3 days    Time spent: over 30 min MDM moderate with multiple medical problems.   Fayrene Helper, MD [facility] Pager [phone]  If 7PM-7AM, please contact night-coverage [email or web address] Password [email or web address] 11/01/2017, 1:29 PM

## 2017-11-01 NOTE — Op Note (Signed)
[facility] Patient Name: Wesley Dyer Procedure Date : 11/01/2017 MRN: [id number] Attending MD: Justice Britain , MD Date of Birth: Jul 25, 1923 CSN: [id number] Age: 84 Admit Type: Inpatient Procedure:                ERCP Indications:              Evaluation and possible treatment of bile duct                            stone(s), Jaundice, Abnormal liver function test,                            Elevated aspartate transaminase (AST), Elevated                            alanine transaminase (ALT), Acute pancreatitis                            suspected due to gallstone, Preop exam:                            Cholecystectomy Providers:                Justice Britain, MD, Kingsley Plan, RN,                            William Dalton, Technician Referring MD:             Rikki Spearing, MD, Curlene Labrum, Glenis Smoker, Azucena Freed PA, PA, Saverio Danker PA Medicines:                General Anesthesia, Indomethacin 881 mg PR Complications:            No immediate complications. Estimated Blood Loss:     Estimated blood loss was minimal. Procedure:                Pre-Anesthesia Assessment:                           - Prior to the procedure, a History and Physical                            was performed, and patient medications and                            allergies were reviewed. The patient's tolerance of                            previous anesthesia was also reviewed. The risks                            and benefits of the procedure and the sedation  options and risks were discussed with the patient.                            All questions were answered, and informed consent                            was obtained. Prior Anticoagulants: The patient has                            taken Eliquis (apixaban), last dose was 3 days                            prior to procedure. ASA Grade Assessment: III - A                            patient with severe systemic disease. After                            reviewing the risks and benefits, the patient was                            deemed in satisfactory condition to undergo the                            procedure.                           After obtaining informed consent, the scope was                            passed under direct vision. Throughout the                            procedure, the patient's blood pressure, pulse, and                            oxygen saturations were monitored continuously. The                            TJF-Q180V ([id number]) Olympus ERCP was introduced                            through the mouth, and used to inject contrast into                            without successful cannulation. The PCF-H190DL                            ([id number]) peds colon was introduced through the                            mouth, and used to inject contrast into and used to  locate the major papilla. The GIF-H190 ([id number])                            Olympus adult EGD was introduced through the mouth,                            and used to inject contrast into without successful                            cannulation. The GIF-1TH190 ([id number]) Olympus EGD                            Therapeutic was introduced through the mouth, and                            used to inject contrast into and used to inject                            contrast into the ventral pancreatic duct. The ERCP                            was technically difficult and complex due to                            post-surgical anatomy, challenging cannulation                            because of abnormal anatomy and significant                            looping. Successful completion of the procedure was                            aided by performing the maneuvers documented                            (below) in this report. The patient  tolerated the                            procedure. Scope In: Scope Out: Findings:      A scout film of the abdomen was obtained.      Multiple scopes were used as noted above - the duodenoscope, the       pediatric colonoscope, the adult endoscope, and then the adult       therapeutic single-channel scope. The scope was passed under direct       vision through the upper GI tract. No gross lesions were noted in the       entire esophagus. There was evidence of a presumed pylorus - passage of       all scopes was attempted but not successful (suspect it has been sewn).       Evidence of a patent Billroth II gastrojejunostomy was found in the       distal greater curve. The gastrojejunal anastomosis was characterized by  congestion, edema and erythema. This was traversed. The efferent limb       was examined 20 cm from anastomosis and was characterized by healthy       appearing mucosa. The afferent limb was examined 20 cm from anastomosis       and was characterized by healthy appearing mucosa. The major papilla was       normal.      Ideal use of the duodenoscope was not possible due to tight angulation       at the afferent limb. Patient positioning, including being placed in a       total left-lateral, a left-oblique, and full prone position did not all       the scope to pass. Very significant angulations in the efferent limb had       been felt as well, concerning for adhesive disease. Using the       therapeutic single channel endoscope, positioning of the major papilla       was possible. The bile duct could not be cannulated with the tapered-tip       cannula, Jagtome sphincterotome and Billroth II sphincterotome. Multiple       attempts at wire cannulation using a Jagwire 0.035 wire as well as a       Angled Jagwire 0.035 wire were not successful at cannulation of the       biliary or the pancreatic tree. To try and delineate the route of the       biliary tree, dilute  ionic contrast was injected and it went into the       ventral pancreatic duct - this was a small amount in order to try and       decrease risk of development of pancreatitis. I personally interpreted       the pancreatic duct images. Ductal flow of contrast was adequate. Image       quality was adequate. Contrast extended to the pancreatic duct causing       opacification of the ventral pancreatic duct in the head of the       pancreas. Further attempts were performed and cannulation of the biliary       tree using the aforementioned tomes/wires and was not successful.      To indicate/mark the afferent limb of the jejunum, an area was       successfully injected with Spot (carbon black) through the       esophagogastroduodenoscope for tattooing.      The endoscope was withdrawn from the patient. Impression:               - No gross lesions in esophagus.                           - Patent Billroth II gastrojejunostomy was found,                            characterized by congestion, edema and erythema.                           - The major papilla appeared normal.                           - Attempts at biliary cannulation were not  successful as outlined above.                           - Partial ampullary injection of contrast was done                            to try and outline biliary tree but only entered                            into the pancreatic duct and was superficially                            injected. A wire could not be placed within the                            duct.                           - To demarcate the afferent limb for possible                            future intervention, an area within the                            gastrojejunostomy was successfully injected. Recommendation:           - The patient will be observed post-procedure,                            until all discharge criteria are met.                           -  Return patient to hospital ward for ongoing care.                           - Observe patient's clinical course.                           - Will discuss case with our Medical and Surgical                            colleagues. As patient's liver biochemical profile                            has continued to improve into today's labs                            (returned during procedure), it would be reasonable                            to consider either a cholecystectomy with                            intraoperative cholangiogram vs a Percutaneous  cholecystostomy tube placement and eventual                            cholecystogram to see if the CBD can be evaluated.                            His LFT pattern can be monitored closely and if                            normalization with either of those being done, then                            no further action would be required. However, if                            there remained consider for biliary obstruction, I                            would recommend transfer to a tertiary referral                            center for further attempt at biliary cannulation.                           - Watch for pancreatitis, bleeding, perforation,                            and cholangitis.                           - Check liver enzymes (AST, ALT, alkaline                            phosphatase, bilirubin) in the morning.                           - The findings and recommendations were discussed                            with the patient.                           - The findings and recommendations were discussed                            with the patient's family.                           - The findings and recommendations were discussed                            with the designated responsible adult.                           - The findings and recommendations were discussed  with the referring physician. Procedure Code(s):        --- Professional ---                           [phone], Esophagogastroduodenoscopy, flexible,                            transoral; with directed submucosal injection(s),                            any substance                           26203, 69, Endoscopic catheterization of the                            pancreatic ductal system, radiological supervision                            and interpretation Diagnosis Code(s):        --- Professional ---                           Z98.0, Intestinal bypass and anastomosis status                           K80.50, Calculus of bile duct without cholangitis                            or cholecystitis without obstruction                           R17, Unspecified jaundice                           R94.5, Abnormal results of liver function studies                           R74.0, Nonspecific elevation of levels of                            transaminase and lactic acid dehydrogenase [LDH]                           K85.90, Acute pancreatitis without necrosis or                            infection, unspecified                           Z01.818, Encounter for other preprocedural                            examination CPT copyright 2017 [employer]. All rights reserved. The codes documented in this report are preliminary and upon coder review may  be revised to meet current compliance requirements. Justice Britain, MD 11/01/2017 12:39:50 PM Number of Addenda: 0

## 2017-11-02 ENCOUNTER — Inpatient Hospital Stay (HOSPITAL_COMMUNITY): Payer: MEDICARE

## 2017-11-02 DIAGNOSIS — I480 Paroxysmal atrial fibrillation: Secondary | ICD-10-CM

## 2017-11-02 DIAGNOSIS — Z8673 Personal history of transient ischemic attack (TIA), and cerebral infarction without residual deficits: Secondary | ICD-10-CM

## 2017-11-02 DIAGNOSIS — E039 Hypothyroidism, unspecified: Secondary | ICD-10-CM

## 2017-11-02 DIAGNOSIS — I10 Essential (primary) hypertension: Secondary | ICD-10-CM

## 2017-11-02 DIAGNOSIS — R413 Other amnesia: Secondary | ICD-10-CM

## 2017-11-02 DIAGNOSIS — F419 Anxiety disorder, unspecified: Secondary | ICD-10-CM

## 2017-11-02 LAB — BASIC METABOLIC PANEL
ANION GAP: 3 — AB (ref 5–15)
BUN: 26 mg/dL — ABNORMAL HIGH (ref 8–23)
CO2: 25 mmol/L (ref 22–32)
Calcium: 7.9 mg/dL — ABNORMAL LOW (ref 8.9–10.3)
Chloride: 104 mmol/L (ref 98–111)
Creatinine, Ser: 1.47 mg/dL — ABNORMAL HIGH (ref 0.61–1.24)
GFR calc Af Amer: 46 mL/min — ABNORMAL LOW (ref >60)
GFR calc non Af Amer: 40 mL/min — ABNORMAL LOW (ref >60)
GLUCOSE: 153 mg/dL — AB (ref 70–99)
POTASSIUM: 4.5 mmol/L (ref 3.5–5.1)
Sodium: 132 mmol/L — ABNORMAL LOW (ref 135–145)

## 2017-11-02 LAB — GLUCOSE, CAPILLARY: Glucose-Capillary: 139 mg/dL — ABNORMAL HIGH (ref 70–99)

## 2017-11-02 LAB — CBC
HEMATOCRIT: 28.5 % — AB (ref 39.0–52.0)
Hemoglobin: 9.4 g/dL — ABNORMAL LOW (ref 13.0–17.0)
MCH: 30.9 pg (ref 26.0–34.0)
MCHC: 33 g/dL (ref 30.0–36.0)
MCV: 93.8 fL (ref 78.0–100.0)
PLATELETS: 128 10*3/uL — AB (ref 150–400)
RBC: 3.04 MIL/uL — ABNORMAL LOW (ref 4.22–5.81)
RDW: 13.3 % (ref 11.5–15.5)
WBC: 8.3 10*3/uL (ref 4.0–10.5)

## 2017-11-02 LAB — HEPATITIS PANEL, ACUTE
HCV Ab: <0.1 s/co ratio (ref 0.0–0.9)
HEP A IGM: NEGATIVE
HEP B S AG: NEGATIVE
Hep B C IgM: NEGATIVE

## 2017-11-02 LAB — HEPATIC FUNCTION PANEL
ALBUMIN: 2.3 g/dL — AB (ref 3.5–5.0)
ALT: 67 U/L — AB (ref 0–44)
AST: 29 U/L (ref 15–41)
Alkaline Phosphatase: 250 U/L — ABNORMAL HIGH (ref 38–126)
Bilirubin, Direct: 0.6 mg/dL — ABNORMAL HIGH (ref 0.0–0.2)
Indirect Bilirubin: 1 mg/dL — ABNORMAL HIGH (ref 0.3–0.9)
TOTAL PROTEIN: 5.1 g/dL — AB (ref 6.5–8.1)
Total Bilirubin: 1.6 mg/dL — ABNORMAL HIGH (ref 0.3–1.2)

## 2017-11-02 LAB — MAGNESIUM: Magnesium: 2.2 mg/dL (ref 1.7–2.4)

## 2017-11-02 LAB — HIV ANTIBODY (ROUTINE TESTING W REFLEX): HIV SCREEN 4TH GENERATION: NONREACTIVE

## 2017-11-02 MED ORDER — TECHNETIUM TC 99M MEBROFENIN IV KIT
5.3000 | PACK | Freq: Once | INTRAVENOUS | Status: AC | PRN
  Administered 2017-11-02: 5.3 via INTRAVENOUS

## 2017-11-02 MED ORDER — MORPHINE SULFATE (PF) 4 MG/ML IV SOLN
3.0000 mg | Freq: Once | INTRAVENOUS | Status: AC
  Administered 2017-11-02: 3 mg via INTRAVENOUS

## 2017-11-02 MED ORDER — MORPHINE SULFATE (PF) 2 MG/ML IV SOLN
INTRAVENOUS | Status: AC
  Administered 2017-11-02: 2 mg via INTRAVENOUS
  Filled 2017-11-02: qty 2

## 2017-11-02 NOTE — Progress Notes (Signed)
PROGRESS NOTE    Wesley Dyer  HWY:[id number] DOB: 05-09-1923 DOA: 10/29/2017 PCP: Curlene Labrum, MD   Brief Narrative:  84 year old with a history of paroxysmal atrial fibrillation on Eliquis, TIA, anxiety, hypothyroidism, tachybradycardia syndrome with pacemaker came here from [facility] for evaluation of upper abdominal pain, nausea with vomiting.  Upon admission he was found to have gallstone pancreatitis.  CT of the abdomen showed distended gallbladder.  HIDA scan initially showed delayed visualization with repeat here was negative for any stones.   Assessment & Plan:   Principal Problem:   Acute pancreatitis Active Problems:   AF (paroxysmal atrial fibrillation) (HCC)   Anxiety   Hypothyroidism   Memory loss   Essential hypertension   History of TIAs   Chronic cholecystitis without calculus   GallStone pancreatitis, improving - Initially patient was found to have elevated lipase level with transaminitis.  This is improving.  He has undergone ERCP on 8/20 and recommended possible cholecystectomy with intraoperative cholangiogram.  HIDA scan was repeated per surgery recommendations which showed patent cystic duct and normal gallbladder. - Given patient's risk factors I had an extensive discussion with the patient's Paster, Darrell [phone]) regarding his chronic condition and high risk for surgery.  At this time family wants to proceed with conservative management instead of surgical intervention.  With respect her wishes. -We will start the patient on oral diet and advance as tolerated. -Currently patient is on Unasyn  Tachybradycardia syndrome -Pacemaker in place  History of paroxysmal atrial fibrillation -Currently he is on heparin drip but will switch him over to Eliquis at the time of discharge once seen by surgery.  History of anxiety -On low-dose benzodiazepine as needed  Hypothyroidism -Continue Synthroid  Advanced dementia - Resume home  medications and provide supportive care.   DVT prophylaxis: Heparin drip Code Status: Full code Family Communication: None at bedside. Spoke with Doristine Bosworth over the phone extensively.  Disposition Plan: Likely discharge next 24 hours  Consultants:   GI  Surgery  Procedures:  ERCP 8/20 - No gross lesions in esophagus. - Patent Billroth II gastrojejunostomy was found, characterized by congestion, edema and erythema. - The major papilla appeared normal. - Attempts at biliary cannulation were not successful as outlined above. - Partial ampullary injection of contrast was done to try and outline biliary tree but only entered into the pancreatic duct and was superficially injected. A wire could not be placed within the duct. - To demarcate the afferent limb for possible future intervention, an area within the gastrojejunostomy was successfully injected. Impression: - The patient will be observed post-procedure, until all discharge criteria are met. - Return patient to hospital ward for ongoing care. - Observe patient's clinical course. - Will discuss case with our Medical and Surgical colleagues. As patient's liver biochemical profile has continued to improve into today's labs (returned during procedure), it would be reasonable to consider either a cholecystectomy with intraoperative cholangiogram vs a Percutaneous cholecystostomy tube placement and eventual cholecystogram to see if the CBD can be evaluated. His LFT pattern can be monitored closely and if normalization with either of those being done, then no further action would be required. However, if there remained consider for biliary obstruction, I would recommend transfer to a tertiary referral center for further attempt at biliary cannulation. - Watch for pancreatitis, bleeding, perforation, and cholangitis. - Check liver enzymes (AST, ALT, alkaline phosphatase, bilirubin) in the morning. - The findings and recommendations  were discussed with the patient. - The  findings and recommendations were discussed with the patient's family. - The findings and recommendations were discussed with the designated responsible adult. - The findings and recommendations were discussed with the referring physician.   Subjective: No complaints, laying in the bed.  Review of Systems Otherwise negative except as per HPI, including: General: Denies fever, chills, night sweats or unintended weight loss. Resp: Denies cough, wheezing, shortness of breath. Cardiac: Denies chest pain, palpitations, orthopnea, paroxysmal nocturnal dyspnea. GI: Denies abdominal pain, nausea, vomiting, diarrhea or constipation GU: Denies dysuria, frequency, hesitancy or incontinence MS: Denies muscle aches, joint pain or swelling Neuro: Denies headache, neurologic deficits (focal weakness, numbness, tingling), abnormal gait Psych: Denies anxiety, depression, SI/HI/AVH Skin: Denies new rashes or lesions ID: Denies sick contacts, exotic exposures, travel  Objective: Vitals:   11/01/17 2020 11/02/17 0635 11/02/17 1129 11/02/17 1316  BP: (!) 151/51 131/69  124/65  Pulse: 67 69  70  Resp: '17 16  20  ' Temp:  98.1 F (36.7 C)    TempSrc:  Oral    SpO2: 100% 100% 97% 99%  Weight:  77.6 kg    Height:        Intake/Output Summary (Last 24 hours) at 11/02/2017 1552 Last data filed at 11/02/2017 1453 Gross per 24 hour  Intake 4550.23 ml  Output 950 ml  Net 3600.23 ml   Filed Weights   10/29/17 2350 10/31/17 0455 11/02/17 0635  Weight: 75.3 kg 78.5 kg 77.6 kg    Examination:  General exam: Appears calm and comfortable ; elderly frail-appearing Respiratory system: Clear to auscultation. Respiratory effort normal. Cardiovascular system: S1 & S2 heard, RRR. No JVD, murmurs, rubs, gallops or clicks. No pedal edema. Gastrointestinal system: Abdomen is nondistended, soft and nontender. No organomegaly or masses felt. Normal bowel sounds  heard. Central nervous system: Alert and oriented x 1. No focal neurological deficits.  Grossly moving all the extremities Extremities: Symmetric 5 x 5 power. Skin: No rashes, lesions or ulcers Psychiatry: Judgement and insight appear normal. Mood & affect appropriate.     Data Reviewed:   CBC: Recent Labs  Lab 10/30/17 0110 10/31/17 0652 11/01/17 0623 11/02/17 0447  WBC 13.0* 9.6 12.1* 8.3  NEUTROABS 11.3*  --   --   --   HGB 10.0* 9.9* 9.2* 9.4*  HCT 30.3* 30.3* 28.0* 28.5*  MCV 94.7 95.3 94.9 93.8  PLT 144* 132* 126* 287*   Basic Metabolic Panel: Recent Labs  Lab 10/30/17 0110 10/31/17 0652 11/01/17 0623 11/02/17 0447  NA 133* 134* 134* 132*  K 4.4 3.9 4.0 4.5  CL 101 103 105 104  CO2 '25 26 24 25  ' GLUCOSE 103* 77 69* 153*  BUN '17 18 18 ' 26*  CREATININE 1.34* 1.34* 1.37* 1.47*  CALCIUM 8.4* 8.3* 8.0* 7.9*  MG  --  1.9  --  2.2  PHOS  --  3.2  --   --    GFR: Estimated Creatinine Clearance: 35.2 mL/min (A) (by C-G formula based on SCr of 1.47 mg/dL (H)). Liver Function Tests: Recent Labs  Lab 10/30/17 0110 10/31/17 0652 11/01/17 0623 11/02/17 0447  AST 193* 100* 52* 29  ALT 204* 126* 83* 67*  ALKPHOS 296* 318* 290* 250*  BILITOT 4.8* 6.0* 3.0* 1.6*  PROT 5.3* 5.2* 4.9* 5.1*  ALBUMIN 2.8* 2.7* 2.4* 2.3*   Recent Labs  Lab 10/30/17 0110 10/31/17 0652 11/01/17 0623  LIPASE 1,145* 380* 160*   No results for input(s): AMMONIA in the last 168 hours. Coagulation Profile: No results  for input(s): INR, PROTIME in the last 168 hours. Cardiac Enzymes: No results for input(s): CKTOTAL, CKMB, CKMBINDEX, TROPONINI in the last 168 hours. BNP (last 3 results) No results for input(s): PROBNP in the last 8760 hours. HbA1C: No results for input(s): HGBA1C in the last 72 hours. CBG: Recent Labs  Lab 10/30/17 0757 10/31/17 0735 11/01/17 1200 11/02/17 0804  GLUCAP 79 74 87 139*   Lipid Profile: No results for input(s): CHOL, HDL, LDLCALC, TRIG, CHOLHDL,  LDLDIRECT in the last 72 hours. Thyroid Function Tests: No results for input(s): TSH, T4TOTAL, FREET4, T3FREE, THYROIDAB in the last 72 hours. Anemia Panel: No results for input(s): VITAMINB12, FOLATE, FERRITIN, TIBC, IRON, RETICCTPCT in the last 72 hours. Sepsis Labs: No results for input(s): PROCALCITON, LATICACIDVEN in the last 168 hours.  No results found for this or any previous visit (from the past 240 hour(s)).       Radiology Studies: Nm Hepatobiliary Liver Func  Result Date: 11/02/2017 CLINICAL DATA:  Right upper quadrant abdominal pain, cholecystitis suspected. EXAM: NUCLEAR MEDICINE HEPATOBILIARY IMAGING TECHNIQUE: Sequential images of the abdomen were obtained out to 60 minutes following intravenous administration of radiopharmaceutical. RADIOPHARMACEUTICALS:  5.4 mCi Tc-57m Choletec IV COMPARISON:  Right upper quadrant ultrasound 10/31/2017 and 10/30/2017 FINDINGS: Liver uptake is normal. There is excretion into the small bowel by 20 minutes. The gallbladder is visualized by 30 minutes. There was some question during the exam of gallbladder visualization. Therefore 3 mg morphine was then injected. Following morphine, gallbladder uptake is exaggerated. IMPRESSION: Patent cystic duct.  No evidence for cholecystitis. Electronically Signed   By: CSan MorelleM.D.   On: 11/02/2017 12:26   Dg Ercp  Result Date: 11/01/2017 CLINICAL DATA:  Pancreatitis, abnormal liver function tests. EXAM: ERCP TECHNIQUE: Multiple spot images obtained with the fluoroscopic device and submitted for interpretation post-procedure. COMPARISON:  Right upper quadrant ultrasound on 10/31/2017 FINDINGS: Images were obtained with a C-arm that show advancement of an endoscope and guidewire without injection of contrast material. One image does show advancement of a guidewire. IMPRESSION: No injection of contrast on the submitted images. These images were submitted for radiologic interpretation only. Please  see the procedural report for the amount of contrast and the fluoroscopy time utilized. Electronically Signed   By: GAletta EdouardM.D.   On: 11/01/2017 11:18        Scheduled Meds: . heparin injection (subcutaneous)  5,000 Units Subcutaneous Q8H  . indomethacin  100 mg Rectal Once  . traZODone  25 mg Oral QHS   Continuous Infusions: . ampicillin-sulbactam (UNASYN) IV 1.5 g (11/02/17 1237)  . famotidine (PEPCID) IV 20 mg (11/01/17 1154)  . lactated ringers 100 mL/hr at 11/02/17 0103     LOS: 4 days    I have spent 35 minutes face to face with the patient and on the ward discussing the patients care, assessment, plan and disposition with other care givers. >50% of the time was devoted counseling the patient about the risks and benefits of treatment and coordinating care.     Ankit CArsenio Loader MD [facility] Pager [phone]  If 7PM-7AM, please contact night-coverage [email or web address] Password [email or web address]/21/2019, 3:52 PM

## 2017-11-02 NOTE — Anesthesia Postprocedure Evaluation (Signed)
Anesthesia Post Note  Patient: Wesley Dyer  Procedure(s) Performed: ENDOSCOPIC RETROGRADE CHOLANGIOPANCREATOGRAPHY (ERCP) WITH PROPOFOL (N/A ) SUBMUCOSAL INJECTION     Patient location during evaluation: PACU Anesthesia Type: General Level of consciousness: awake and alert Pain management: pain level controlled Vital Signs Assessment: post-procedure vital signs reviewed and stable Respiratory status: spontaneous breathing, nonlabored ventilation, respiratory function stable and patient connected to nasal cannula oxygen Cardiovascular status: blood pressure returned to baseline and stable Postop Assessment: no apparent nausea or vomiting Anesthetic complications: no    Last Vitals:  Vitals:   11/01/17 2020 11/02/17 0635  BP: (!) 151/51 131/69  Pulse: 67 69  Resp: 17 16  Temp:  36.7 C  SpO2: 100% 100%    Last Pain:  Vitals:   11/02/17 0635  TempSrc: Oral  PainSc:                  Adamaris King L Akeila Lana

## 2017-11-02 NOTE — Care Management Important Message (Signed)
Important Message  Patient Details  Name: Wesley Dyer MRN: [id number] Date of Birth: [birth year] [place]-----Please disregard the signature note.this was enter on the worng patient.  Medicare Important Message Given:  No   Drea Jurewicz 11/02/2017, 1:47 PM

## 2017-11-02 NOTE — Evaluation (Signed)
Occupational Therapy Evaluation Patient Details Name: Wesley Dyer MRN: [id number] DOB: 08-19-1923 Today's Date: 11/02/2017    History of Present Illness Pt is a 84 y.o. M with significant PMH of paroxysmal atrial fibrillation on Eliquid, history of TIA, anxiety, hypothyroidism, and tachybradycardia syndrome with pacer, now presenting with reports of upper abdominal pain and nausea with vomiting.    Clinical Impression   PTA, pt was independent with basic ADL but required assistance navigating unfamiliar locations due to visual deficits. He presents to OT evaluation pleasantly confused and oriented to self only. He is able to follow commands but demonstrates very little awareness or problem solving skills at this time. He requires max assist for stand-pivot simulated toilet transfers, mod assist for LB ADL, and min assist for UB ADL. Pt would benefit from continued OT services while admitted to improve independence and safety with ADL and functional mobility. At current functional level, recommend short-term SNF level rehabilitation post-acute D/C to maximize safety and independence with ADL and functional mobility. OT will continue to follow while admitted.     Follow Up Recommendations  SNF;Supervision/Assistance - 24 hour    Equipment Recommendations  Other (comment)(TBD at next venue of care)    Recommendations for Other Services       Precautions / Restrictions Precautions Precautions: Fall Restrictions Weight Bearing Restrictions: No      Mobility Bed Mobility Overal bed mobility: Needs Assistance Bed Mobility: Supine to Sit     Supine to sit: Min assist     General bed mobility comments: Min assist to elevate trunk from [facility] Cues to initiate.   Transfers Overall transfer level: Needs assistance Equipment used: Rolling walker (2 wheeled) Transfers: Sit to/from [facility] Sit to Stand: Mod assist Stand pivot transfers: Max assist       General  transfer comment: Max assist to manage RW and to continue with tasks.     Balance Overall balance assessment: Needs assistance Sitting-balance support: Bilateral upper extremity supported;Feet unsupported Sitting balance-Leahy Scale: Fair     Standing balance support: Bilateral upper extremity supported;During functional activity Standing balance-Leahy Scale: Poor Standing balance comment: Relies on BUE support. Limited by posterior lean.                            ADL either performed or assessed with clinical judgement   ADL Overall ADL's : Needs assistance/impaired Eating/Feeding: Set up;Sitting   Grooming: Supervision/safety;Sitting Grooming Details (indicate cue type and reason): tends to perseverate during tasks requiring cues to stop washing face Upper Body Bathing: Minimal assistance;Sitting   Lower Body Bathing: Moderate assistance;Sit to/from stand Lower Body Bathing Details (indicate cue type and reason): Assist for standing aspects Upper Body Dressing : Minimal assistance;Sitting   Lower Body Dressing: Moderate assistance;Sit to/from stand Lower Body Dressing Details (indicate cue type and reason): assist for standing aspects.  Toilet Transfer: Maximal Interior and spatial designer Details (indicate cue type and reason): simulated with stand-pivot from bed to chair Toileting- Clothing Manipulation and Hygiene: Total assistance;Sit to/from stand       Functional mobility during ADLs: Maximal assistance;Rolling walker General ADL Comments: Pt requiring cues for forward translation, use of RW, and to avoid pulling on condom catheter as well as IV     Vision Patient Visual Report: No change from baseline Vision Assessment?: No apparent visual deficits     Perception     Praxis      Pertinent Vitals/Pain Pain Assessment: No/denies  pain     Hand Dominance     Extremity/Trunk Assessment Upper Extremity Assessment Upper Extremity Assessment:  Generalized weakness   Lower Extremity Assessment Lower Extremity Assessment: Generalized weakness       Communication Communication Communication: No difficulties   Cognition Arousal/Alertness: Awake/alert Behavior During Therapy: WFL for tasks assessed/performed Overall Cognitive Status: Impaired/Different from baseline Area of Impairment: Orientation;Attention;Memory;Following commands;Safety/judgement;Awareness;Problem solving                 Orientation Level: Disoriented to;Place;Time;Situation Current Attention Level: Focused Memory: Decreased recall of precautions;Decreased short-term memory Following Commands: Follows one step commands inconsistently Safety/Judgement: Decreased awareness of safety;Decreased awareness of deficits Awareness: Intellectual Problem Solving: Decreased initiation;Difficulty sequencing;Requires verbal cues;Requires tactile cues General Comments: Pt pleasantly confused throughout session. He asked for multiple family members and was easily redirected. Able to follow commands but only oriented to self.    General Comments  No family present during session.     Exercises     Shoulder Instructions      Home Living Family/patient expects to be discharged to:: Private residence Living Arrangements: Spouse/significant other Available Help at Discharge: Family;Available 24 hours/day Type of Home: House                       Home Equipment: Kasandra Knudsen - single point   Additional Comments: Information from PT evaluation. No family present during session and pt unable to report PLOF.       Prior Functioning/Environment Level of Independence: Needs assistance  Gait / Transfers Assistance Needed: needs additional assistance getting around unknown places other than home and church due to visual deficits. uses cane ADL's / Homemaking Assistance Needed: independent            OT Problem List: Decreased strength;Decreased range of  motion;Decreased activity tolerance;Impaired balance (sitting and/or standing);Decreased safety awareness;Decreased knowledge of use of DME or AE;Decreased knowledge of precautions;Pain      OT Treatment/Interventions: Self-care/ADL training;Therapeutic exercise;Energy conservation;DME and/or AE instruction;Therapeutic activities;Patient/family education;Balance training;Cognitive remediation/compensation    OT Goals(Current goals can be found in the care plan section) Acute Rehab OT Goals Patient Stated Goal: "be a little faster." OT Goal Formulation: With patient Time For Goal Achievement: 11/16/17 Potential to Achieve Goals: Good ADL Goals Pt Will Perform Grooming: with min assist;standing Pt Will Perform Upper Body Bathing: with modified independence;sitting Pt Will Perform Lower Body Dressing: with min guard assist;sit to/from stand Pt Will Transfer to Toilet: with min assist;ambulating;bedside commode Pt Will Perform Toileting - Clothing Manipulation and hygiene: with min assist;sit to/from stand Additional ADL Goal #1: Pt will demonstrate emergent awareness during ADL participation in a minimally distracting environment.  OT Frequency: Min 2X/week   Barriers to D/C:            Co-evaluation              AM-PAC PT "6 Clicks" Daily Activity     Outcome Measure Help from another person eating meals?: None Help from another person taking care of personal grooming?: None Help from another person toileting, which includes using toliet, bedpan, or urinal?: A Lot Help from another person bathing (including washing, rinsing, drying)?: A Lot Help from another person to put on and taking off regular upper body clothing?: A Little Help from another person to put on and taking off regular lower body clothing?: A Lot 6 Click Score: 17   End of Session Equipment Utilized During Treatment: Gait belt;Rolling walker Nurse Communication: Mobility status  Activity Tolerance: Patient  tolerated treatment well Patient left: in chair;with call bell/phone within reach;with chair alarm set;with nursing/sitter in room  OT Visit Diagnosis: Other abnormalities of gait and mobility (R26.89);Muscle weakness (generalized) (M62.81);Other symptoms and signs involving cognitive function                Time: 1524-1550 OT Time Calculation (min): 26 min Charges:  OT General Charges $OT Visit: 1 Visit OT Evaluation $OT Eval Moderate Complexity: 1 Mod OT Treatments $Self Care/Home Management : 8-22 mins  Norman Herrlich, MS OTR/L  Pager: [place] 11/02/2017, 5:16 PM

## 2017-11-02 NOTE — Progress Notes (Addendum)
OT Cancellation Note  Patient Details Name: Wesley Dyer MRN: [id number] DOB: 03-Mar-1924   Cancelled Treatment:    Reason Eval/Treat Not Completed: Patient at procedure or test/ unavailable. Pt is off unit for procedure at this time. Will check back as able.   Norman Herrlich, MS OTR/L  Pager: [phone] 11/02/2017, 10:37 AM

## 2017-11-02 NOTE — Progress Notes (Signed)
Pharmacy Antibiotic Note  Wesley Dyer is a 84 y.o. male admitted on 10/29/2017 with concern for cholecystitis and pancreatitis.  Pharmacy has been consulted for Unasyn dosing.  Today is abx day #4.  Pt is for HIDA scan today and awaiting surgical team recommendations.  Plan: - Continue Unasyn 1.5g IV every 6 hours - Will continue to follow renal function, culture results, LOT, and antibiotic de-escalation plans   Height: 6' (182.9 cm) Weight: 171 lb (77.6 kg) IBW/kg (Calculated) : 77.6  Temp (24hrs), Avg:98.1 F (36.7 C), Min:98.1 F (36.7 C), Max:98.1 F (36.7 C)  Recent Labs  Lab 10/30/17 0110 10/31/17 0652 11/01/17 0623 11/02/17 0447  WBC 13.0* 9.6 12.1* 8.3  CREATININE 1.34* 1.34* 1.37* 1.47*    Estimated Creatinine Clearance: 35.2 mL/min (A) (by C-G formula based on SCr of 1.47 mg/dL (H)).    Allergies  Allergen Reactions  . Warfarin Sodium Other (See Comments)    REACTION: bleeding    Wesley Dyer, Pharm.D., BCPS Clinical Pharmacist Pager: [phone] Clinical phone for 11/02/2017 from 8:30-4:00 is [phone].  **Pharmacist phone directory can now be found on [email or web address] (PW [email or web address]).  Listed under [place].  11/02/2017 1:54 PM '

## 2017-11-02 NOTE — NC FL2 (Signed)
[place] LEVEL OF CARE SCREENING TOOL     IDENTIFICATION  Patient Name: Wesley Dyer Birthdate: [birth year] Sex: male Admission Date (Current Location): 10/29/2017  [facility] and [place] Number:  [employer] and Address:  [facility], [place] [address] [place], [place] [address]      Provider Number: [id number]  Attending Physician Name and Address:  Damita Lack, MD  Relative Name and Phone Number:  Ilona Sorrel, spouse, [phone]    Current Level of Care: Hospital Recommended Level of Care: [facility] Prior Approval Number:    Date Approved/Denied:   PASRR Number: [id number]  Discharge Plan: SNF    Current Diagnoses: Patient Active Problem List   Diagnosis Date Noted  . Hypothyroidism 10/30/2017  . Memory loss 10/30/2017  . Essential hypertension 10/30/2017  . History of TIAs 10/30/2017  . Chronic cholecystitis without calculus 10/30/2017  . Acute pancreatitis 10/30/2017  . Tachycardia-bradycardia ([place]) 10/26/2012  . Transient ischemic attack   . Anxiety   . AF (paroxysmal atrial fibrillation) ([place]) 07/13/2009  . THYROID NODULE 11/15/2008  . BRADYCARDIA, CHRONIC 11/15/2008  . WEAKNESS 11/15/2008  . CHEST PAIN-UNSPECIFIED 11/11/2008    Orientation RESPIRATION BLADDER Height & Weight     Self  Normal Incontinent, External catheter Weight: 77.6 kg Height:  6' (182.9 cm)  BEHAVIORAL SYMPTOMS/MOOD NEUROLOGICAL BOWEL NUTRITION STATUS      Continent Diet(Please see DC Summary)  AMBULATORY STATUS COMMUNICATION OF NEEDS Skin   Extensive Assist Verbally Normal                       Personal Care Assistance Level of Assistance  Bathing, Feeding, Dressing Bathing Assistance: Maximum assistance Feeding assistance: Limited assistance Dressing Assistance: Limited assistance     Functional Limitations Info  Sight, Hearing, Speech Sight Info: Impaired Hearing Info: Adequate Speech Info:  Adequate    SPECIAL CARE FACTORS FREQUENCY  PT (By licensed PT), OT (By licensed OT)     PT Frequency: 5x/week OT Frequency: 3x/week            Contractures Contractures Info: Not present    Additional Factors Info  Code Status, Allergies, Psychotropic Code Status Info: Full Allergies Info: Warfarin Sodium Psychotropic Info: Trazadone         Current Medications (11/02/2017):  This is the current hospital active medication list Current Facility-Administered Medications  Medication Dose Route Frequency Provider Last Rate Last Dose  . acetaminophen (TYLENOL) tablet 650 mg  650 mg Oral Q6H PRN Opyd, Ilene Qua, MD       Or  . acetaminophen (TYLENOL) suppository 650 mg  650 mg Rectal Q6H PRN Opyd, Ilene Qua, MD      . ampicillin-sulbactam (UNASYN) 1.5 g in sodium chloride 0.9 % 100 mL IVPB  1.5 g Intravenous Q6H Esterwood, Amy S, PA-C 200 mL/hr at 11/02/17 0527 1.5 g at 11/02/17 0527  . famotidine (PEPCID) IVPB 20 mg premix  20 mg Intravenous Q24H Elodia Florence., MD 100 mL/hr at 11/01/17 1154 20 mg at 11/01/17 1154  . fentaNYL (SUBLIMAZE) injection 6.5 mcg  6.5 mcg Intravenous Q2H PRN Elodia Florence., MD      . heparin injection 5,000 Units  5,000 Units Subcutaneous Q8H Elodia Florence., MD   5,000 Units at 11/02/17 [phone]  . indomethacin (INDOCIN) 50 MG suppository 100 mg  100 mg Rectal Once Vena Rua, PA-C      . lactated ringers  infusion   Intravenous Continuous Elodia Florence., MD 100 mL/hr at 11/02/17 0103    . LORazepam (ATIVAN) injection 0.5 mg  0.5 mg Intravenous Q6H PRN Opyd, Ilene Qua, MD   0.5 mg at 11/01/17 1155  . ondansetron (ZOFRAN) tablet 4 mg  4 mg Oral Q6H PRN Opyd, Ilene Qua, MD       Or  . ondansetron (ZOFRAN) injection 4 mg  4 mg Intravenous Q6H PRN Opyd, Ilene Qua, MD   4 mg at 10/31/17 1923  . traZODone (DESYREL) tablet 25 mg  25 mg Oral QHS Gardiner Barefoot, NP   25 mg at 11/01/17 2105     Discharge  Medications: Please see discharge summary for a list of discharge medications.  Relevant Imaging Results:  Relevant Lab Results:   Additional Information SSN: [place], [place]

## 2017-11-02 NOTE — Progress Notes (Addendum)
Patient ID: Wesley Dyer, male   DOB: 1924/01/09, 84 y.o.   MRN: [id number] Patient down getting his HIDA scan.  Will await these results and then make further recommendations and discuss findings with [name], and wife, [name].  Henreitta Cea 11:03 AM 11/02/2017   HIDA showed no evidence of acute cholecystitis.  He does not require cholecystectomy  Kathryne Eriksson. Dahlia Bailiff, MD, [place] [phone] [facility] Surgery

## 2017-11-02 NOTE — Clinical Social Work Note (Signed)
Clinical Social Work Assessment  Patient Details  Name: Wesley Dyer MRN: [id number] Date of Birth: Jan 22, 1924  Date of referral:  11/02/17               Reason for consult:  Facility Placement                Permission sought to share information with:  Facility Sport and exercise psychologist, Family Supports Permission granted to share information::  Yes, Verbal Permission Granted  Name::     Clinical research associate::  SNFs  Relationship::  Spouse/Pastor  Contact Information:  [phone]  Housing/Transportation Living arrangements for the past 2 months:  Single Family Home Source of Information:  Spouse, Other (Comment Required) Patient Interpreter Needed:  None Criminal Activity/Legal Involvement Pertinent to Current Situation/Hospitalization:  No - Comment as needed Significant Relationships:  Spouse, Other Family Members Lives with:  Spouse Do you feel safe going back to the place where you live?  No Need for family participation in patient care:  Yes (Comment)  Care giving concerns:  CSW received consult for possible SNF placement at time of discharge. CSW spoke with patient's spouse and Lafayette Dragon regarding PT recommendation of SNF placement at time of discharge. Patient's wife stated that she would rather patient return home. She requested we speak with Lafayette Dragon to get his input. He asked CSW questions regarding Palliative care at home. CSW to continue to follow and assist with discharge planning needs.   Social Worker assessment / plan:  CSW spoke with patient's spouse concerning possibility of rehab at [facility] before returning home.  Employment status:  Retired Forensic scientist:  Medicare PT Recommendations:  [place] / Referral to community resources:  [place]  Patient/Family's Response to care:  Lafayette Dragon questioned whether patient needed rehab if he was going to go home with Palliative care. CSW explained that  Palliative care will not provide support in the home, but that Hospice services would. He questioned patient's prognosis and CSW referred him the the Physician. He reported that he will speak with the Physician but the plan would be to take the patient home.   Patient/Family's Understanding of and Emotional Response to Diagnosis, Current Treatment, and Prognosis:  Patient/family is realistic regarding discharge needs and expressed being hopeful for comfort. Patient's spouse and Lafayette Dragon expressed understanding of CSW role and discharge process as well as medical condition. No questions/concerns about plan or treatment.    Emotional Assessment Appearance:  Appears stated age Attitude/Demeanor/Rapport:  Unable to Assess Affect (typically observed):  Unable to Assess Orientation:  Oriented to Self Alcohol / Substance use:  Not Applicable Psych involvement (Current and /or in the community):  No (Comment)  Discharge Needs  Concerns to be addressed:  Care Coordination Readmission within the last 30 days:  No Current discharge risk:  Cognitively Impaired Barriers to Discharge:  Continued Medical Work up   [place] signing off. Please re-consult if needed.   Benard Halsted, LCSWA 11/02/2017, 3:35 PM

## 2017-11-03 LAB — CBC
HEMATOCRIT: 31.5 % — AB (ref 39.0–52.0)
HEMOGLOBIN: 10.2 g/dL — AB (ref 13.0–17.0)
MCH: 30.8 pg (ref 26.0–34.0)
MCHC: 32.4 g/dL (ref 30.0–36.0)
MCV: 95.2 fL (ref 78.0–100.0)
Platelets: 195 10*3/uL (ref 150–400)
RBC: 3.31 MIL/uL — ABNORMAL LOW (ref 4.22–5.81)
RDW: 13.5 % (ref 11.5–15.5)
WBC: 9.4 10*3/uL (ref 4.0–10.5)

## 2017-11-03 LAB — COMPREHENSIVE METABOLIC PANEL
ALK PHOS: 238 U/L — AB (ref 38–126)
ALT: 58 U/L — ABNORMAL HIGH (ref 0–44)
ANION GAP: 6 (ref 5–15)
AST: 35 U/L (ref 15–41)
Albumin: 2.6 g/dL — ABNORMAL LOW (ref 3.5–5.0)
BILIRUBIN TOTAL: 1.6 mg/dL — AB (ref 0.3–1.2)
BUN: 22 mg/dL (ref 8–23)
CALCIUM: 8.2 mg/dL — AB (ref 8.9–10.3)
CO2: 24 mmol/L (ref 22–32)
Chloride: 103 mmol/L (ref 98–111)
Creatinine, Ser: 1.28 mg/dL — ABNORMAL HIGH (ref 0.61–1.24)
GFR, EST AFRICAN AMERICAN: 54 mL/min — AB (ref >60)
GFR, EST NON AFRICAN AMERICAN: 47 mL/min — AB (ref >60)
Glucose, Bld: 74 mg/dL (ref 70–99)
POTASSIUM: 3.3 mmol/L — AB (ref 3.5–5.1)
Sodium: 133 mmol/L — ABNORMAL LOW (ref 135–145)
TOTAL PROTEIN: 5.3 g/dL — AB (ref 6.5–8.1)

## 2017-11-03 LAB — MAGNESIUM: Magnesium: 1.9 mg/dL (ref 1.7–2.4)

## 2017-11-03 MED ORDER — POTASSIUM CHLORIDE CRYS ER 20 MEQ PO TBCR: 40.0000 meq | EXTENDED_RELEASE_TABLET | Freq: Once | ORAL | Status: DC

## 2017-11-03 NOTE — Care Management Note (Signed)
Case Management Note  Patient Details  Name: MANLEY FASON MRN: [id number] Date of Birth: 10/25/1923  Subjective/Objective:         Admitted with pancreatitis,Hx of  paroxysmal atrial fibrillation on Eliquis, history of TIA, anxiety, hypothyroidism, and tachybradycardia syndrome with pacer. PTA recently d/c from [facility] home health services. Wife stated ok to use [facility] again if home health services are needed @ d/c.  PCP: Judd Lien  Action/Plan: Transition to home with home health services to follow. Declined SNF placement and home hospice care.  Family will provide transportation to home.  Expected Discharge Date:  11/03/17               Expected Discharge Plan:  [place]  In-House Referral:  Clinical Social Work  Discharge planning Services  CM Consult  Post Acute Care Choice:    Choice offered to:  Spouse  DME Arranged:  N/A DME Agency:  NA  HH Arranged:  PT, OT, Nurse's Aide, Social Work [employer] Agency:  [place]  Status of Service:  Completed, signed off  If discussed at [employer], dates discussed:    Additional Comments: [facility] liaison  called and spoke with NCM stating wife had requested home hospice referral. MD stated spoke with wife regarding home hospice as an option and option declined. Wife requested pt to d/c with home health services. NCM made [facility] liaison aware.  Whitman Hero [place], RN 11/03/2017, 12:48 PM

## 2017-11-03 NOTE — Progress Notes (Signed)
Patient's HIDA scan is negative for cholecystitis and shows prompt filling of the duodenum.  Given significant fall and essentially normalization of his TB, it is likely has passed his CBD stone.  He does not have cholecystitis.  Given his age and risks for surgery, this will be deferred at this time.  Low fat diet is recommended and patient can be discharged home per the primary service. This was discussed with the patient's pastor, Jannet Askew, who is in agreement with this plan.  No surgical follow up needed at this time.  Henreitta Cea 9:44 AM 11/03/2017

## 2017-11-03 NOTE — Progress Notes (Signed)
Physical Therapy Treatment Patient Details Name: Wesley Dyer MRN: [id number] DOB: 09-14-1923 Today's Date: 11/03/2017    History of Present Illness Pt is a 84 y.o. M with significant PMH of paroxysmal atrial fibrillation on Eliquid, history of TIA, anxiety, hypothyroidism, and tachybradycardia syndrome with pacer, now presenting with reports of upper abdominal pain and nausea with vomiting.     PT Comments    Patient not progressing towards his physical therapy goals. Remains oriented to self only and displays increased agitation, restlessness, and decreased safety awareness this session, as evidenced by him trying to exit bed through two bed rails upon PT entry. Continues to require up to two person maximal assistance to transfer from bed to chair. Unable to progress to walking due to heavy posterior lean and poor awareness. Patient presents as a significantly high fall risk and highly recommend SNF to maximize functional independence and decrease caregiver burden. Will continue to follow acutely and progress as tolerated.    Follow Up Recommendations  SNF;Supervision/Assistance - 24 hour     Equipment Recommendations  None recommended by PT    Recommendations for Other Services       Precautions / Restrictions Precautions Precautions: Fall Restrictions Weight Bearing Restrictions: No    Mobility  Bed Mobility Overal bed mobility: Needs Assistance Bed Mobility: Supine to Sit     Supine to sit: Mod assist     General bed mobility comments: Min assist to elevate trunk from [facility]. Cues to initiate.   Transfers Overall transfer level: Needs assistance Equipment used: Rolling walker (2 wheeled);2 person hand held assist Transfers: Sit to/from [facility] Sit to Stand: Mod assist;+2 physical assistance Stand pivot transfers: Max assist;+2 physical assistance       General transfer comment: Initially stood with walker but patient with poor proximity  (actively pushing walker away) so transferred to handheld assist and pivoted from bed to chair. Displays posterior lean and difficulty achieving anterior weight shift  Ambulation/Gait                 Stairs             Wheelchair Mobility    Modified Rankin (Stroke Patients Only)       Balance Overall balance assessment: Needs assistance Sitting-balance support: Bilateral upper extremity supported;Feet unsupported Sitting balance-Leahy Scale: Fair     Standing balance support: Bilateral upper extremity supported;During functional activity Standing balance-Leahy Scale: Poor Standing balance comment: Relies on BUE support. Limited by posterior lean.                             Cognition Arousal/Alertness: Awake/alert Behavior During Therapy: WFL for tasks assessed/performed Overall Cognitive Status: Impaired/Different from baseline Area of Impairment: Orientation;Attention;Memory;Following commands;Safety/judgement;Awareness;Problem solving                 Orientation Level: Disoriented to;Place;Time;Situation Current Attention Level: Focused Memory: Decreased recall of precautions;Decreased short-term memory Following Commands: Follows one step commands inconsistently Safety/Judgement: Decreased awareness of safety;Decreased awareness of deficits Awareness: Intellectual Problem Solving: Decreased initiation;Difficulty sequencing;Requires verbal cues;Requires tactile cues General Comments: Oriented to self only. Perseveration on, "where are my clothes," and "when is my pastor going to get here?" Shows little retention or recall of tasks. Confused and became emotionally labile during session.       Exercises      General Comments        Pertinent Vitals/Pain Pain Assessment: Faces Faces Pain Scale: No  hurt    Home Living                      Prior Function            PT Goals (current goals can now be found in the care  plan section) Acute Rehab PT Goals Patient Stated Goal: "be a little faster." Potential to Achieve Goals: Fair Progress towards PT goals: Not progressing toward goals - comment(change in cognition)    Frequency    Min 2X/week      PT Plan Current plan remains appropriate    Co-evaluation              AM-PAC PT "6 Clicks" Daily Activity  Outcome Measure  Difficulty turning over in bed (including adjusting bedclothes, sheets and blankets)?: A Lot Difficulty moving from lying on back to sitting on the side of the bed? : Unable Difficulty sitting down on and standing up from a chair with arms (e.g., wheelchair, bedside commode, etc,.)?: Unable Help needed moving to and from a bed to chair (including a wheelchair)?: A Lot Help needed walking in hospital room?: Total Help needed climbing 3-5 steps with a railing? : Total 6 Click Score: 8    End of Session Equipment Utilized During Treatment: Gait belt Activity Tolerance: Treatment limited secondary to agitation Patient left: in chair;with call bell/phone within reach;with chair alarm set;with nursing/sitter in room Nurse Communication: Mobility status PT Visit Diagnosis: Unsteadiness on feet (R26.81);Other abnormalities of gait and mobility (R26.89)     Time: 1583-0940 PT Time Calculation (min) (ACUTE ONLY): 23 min  Charges:  $Therapeutic Activity: 23-37 mins                     Ellamae Sia, PT, DPT Acute Rehabilitation Services  Pager: [phone]    Willy Eddy 11/03/2017, 10:01 AM

## 2017-11-03 NOTE — Discharge Summary (Signed)
Physician Discharge Summary  Wesley Dyer:[id number] DOB: 1923-09-29 DOA: 10/29/2017  PCP: Curlene Labrum, MD  Admit date: 10/29/2017 Discharge date: 11/03/2017  Admitted From: Home Disposition: Home with home health  Recommendations for Outpatient Follow-up:  1. Follow up with PCP in 1-2 weeks 2. Please obtain BMP/CBC in one week your next doctors visit.  3. Advised to follow-up with outpatient hospice possibly if family is interested  Home Health: PT/OT/aide Equipment/Devices: None Discharge Condition: Stable CODE STATUS: Full code family Diet recommendation: Regular  Brief/Interim Summary: 84 year old with a history of paroxysmal atrial fibrillation on Eliquis, TIA, anxiety, hypothyroidism, tachybradycardia syndrome with pacemaker came here from [facility] for evaluation of upper abdominal pain, nausea with vomiting.  Upon admission he was found to have gallstone pancreatitis.  CT of the abdomen showed distended gallbladder.  HIDA scan initially showed delayed visualization with repeat here was negative for any stones. After an extensive discussion with the family including the pastor and his wife it was determined that they would want to proceed with conservative management.  Surgery was later this.  Antibiotics were discontinued as no active signs of infection were noted.  He was also evaluated by physical therapy who recommended skilled nursing facility but family preferred to take the patient home with the help of home health. Arrangements were made with the help of case management. During his discharge I had an extensive conversation with the patient's wife and his pastor regarding benefits of palliative care services.  In the future they are open about thinking about this and will discuss this with his primary care physician.  Given patient's age, chronic debility and multiple comorbidities he remains at high risk of repeated readmission.  Family is aware of this and  will also discuss this with the palliative care services.   Discharge Diagnoses:  Principal Problem:   Acute pancreatitis Active Problems:   AF (paroxysmal atrial fibrillation) (HCC)   Anxiety   Hypothyroidism   Memory loss   Essential hypertension   History of TIAs   Chronic cholecystitis without calculus  GallStone pancreatitis, improved - Initially patient was found to have elevated lipase level with transaminitis.  This is improving.  He has undergone ERCP on 8/20 and recommended possible cholecystectomy with intraoperative cholangiogram.  HIDA scan was repeated per surgery recommendations which showed patent cystic duct and normal gallbladder. - Discontinue antibiotics managed conservatively  Tachybradycardia syndrome -Pacemaker in place  History of paroxysmal atrial fibrillation -Currently he is on heparin drip but will switch him over to Eliquis at the time of discharge once seen by surgery.  History of anxiety -On low-dose benzodiazepine as needed  Hypothyroidism -Continue Synthroid  Advanced dementia - Resume home medications and provide supportive care.   Had an extensive discussion about patient's care with his wife and the pastor.  All the questions were answered.  Discharge Instructions   Allergies as of 11/03/2017      Reactions   Warfarin Sodium Other (See Comments)   REACTION: bleeding      Medication List    TAKE these medications   acetaminophen 325 MG tablet Commonly known as:  TYLENOL Take 650 mg by mouth every 6 (six) hours as needed for mild pain or headache.   ALPRAZolam 0.25 MG tablet Commonly known as:  XANAX Take 1 tablet by mouth daily as needed.   ELIQUIS 5 MG Tabs tablet Generic drug:  apixaban take 1 tablet by mouth twice a day   IRON PO Take 1 tablet by  mouth once a week.   levothyroxine 75 MCG tablet Commonly known as:  SYNTHROID, LEVOTHROID Take 1 tablet by mouth daily.   memantine 10 MG tablet Commonly known  as:  NAMENDA Take 1 tablet by mouth 2 (two) times daily.   metoprolol tartrate 25 MG tablet Commonly known as:  LOPRESSOR Take 0.5 tablets (12.5 mg total) 2 (two) times daily by mouth. What changed:  how much to take   OCUVITE PRESERVISION Tabs Take 1 tablet by mouth 2 (two) times daily.   pantoprazole 40 MG tablet Commonly known as:  PROTONIX Take 1 tablet by mouth daily.   simvastatin 20 MG tablet Commonly known as:  ZOCOR Take 20 mg by mouth at bedtime.   tamsulosin 0.4 MG Caps capsule Commonly known as:  FLOMAX Take 0.4 mg by mouth 2 (two) times daily.      Follow-up Information    Burdine, Virgina Evener, MD. Schedule an appointment as soon as possible for a visit in 1 week(s).   Specialty:  Family Medicine Contact information: [place] [address] [phone]        Arnoldo Lenis, MD .   Specialty:  Cardiology Contact information: [place] [address] [phone]        Thompson Grayer, MD .   Specialty:  Cardiology Contact information: [place] [address] [place] Follow up.   Specialty:  Home Health Services Why:  home health services arranged Contact information: [place] [address] [phone]          Allergies  Allergen Reactions  . Warfarin Sodium Other (See Comments)    REACTION: bleeding    You were cared for by a hospitalist during your hospital stay. If you have any questions about your discharge medications or the care you received while you were in the hospital after you are discharged, you can call the unit and asked to speak with the hospitalist on call if the hospitalist that took care of you is not available. Once you are discharged, your primary care physician will handle any further medical issues. Please note that no refills for any discharge medications will be authorized once you are  discharged, as it is imperative that you return to your primary care physician (or establish a relationship with a primary care physician if you do not have one) for your aftercare needs so that they can reassess your need for medications and monitor your lab values.  Consultations:  None   Procedures/Studies: Nm Hepatobiliary Liver Func  Result Date: 11/02/2017 CLINICAL DATA:  Right upper quadrant abdominal pain, cholecystitis suspected. EXAM: NUCLEAR MEDICINE HEPATOBILIARY IMAGING TECHNIQUE: Sequential images of the abdomen were obtained out to 60 minutes following intravenous administration of radiopharmaceutical. RADIOPHARMACEUTICALS:  5.4 mCi Tc-26m  Choletec IV COMPARISON:  Right upper quadrant ultrasound 10/31/2017 and 10/30/2017 FINDINGS: Liver uptake is normal. There is excretion into the small bowel by 20 minutes. The gallbladder is visualized by 30 minutes. There was some question during the exam of gallbladder visualization. Therefore 3 mg morphine was then injected. Following morphine, gallbladder uptake is exaggerated. IMPRESSION: Patent cystic duct.  No evidence for cholecystitis. Electronically Signed   By: San Morelle M.D.   On: 11/02/2017 12:26   US Abdomen Limited  Result Date: 10/31/2017 CLINICAL DATA:  Elevated liver function tests. EXAM: ULTRASOUND ABDOMEN LIMITED RIGHT UPPER QUADRANT COMPARISON:  10/30/2017 FINDINGS: Gallbladder: Similar appearance of mildly distended gallbladder with wall thickening (7 mm thickness) and small volume sludge. No shadowing gallstones. No sonographic Murphy sign noted by sonographer. Common bile duct: Diameter: 4 mm Liver: No focal lesion identified. Within normal limits in parenchymal echogenicity. Portal vein is patent on color Doppler imaging with normal direction of blood flow towards the liver. IMPRESSION: Distended gallbladder with wall thickening and small volume sludge, unchanged from yesterday's study. No shadowing stones or  biliary dilatation. Electronically Signed   By: Logan Bores M.D.   On: 10/31/2017 14:43   Dg Ercp  Result Date: 11/01/2017 CLINICAL DATA:  Pancreatitis, abnormal liver function tests. EXAM: ERCP TECHNIQUE: Multiple spot images obtained with the fluoroscopic device and submitted for interpretation post-procedure. COMPARISON:  Right upper quadrant ultrasound on 10/31/2017 FINDINGS: Images were obtained with a C-arm that show advancement of an endoscope and guidewire without injection of contrast material. One image does show advancement of a guidewire. IMPRESSION: No injection of contrast on the submitted images. These images were submitted for radiologic interpretation only. Please see the procedural report for the amount of contrast and the fluoroscopy time utilized. Electronically Signed   By: Aletta Edouard M.D.   On: 11/01/2017 11:18   US Abdomen Limited Ruq  Result Date: 10/30/2017 CLINICAL DATA:  Acute pancreatitis. EXAM: ULTRASOUND ABDOMEN LIMITED RIGHT UPPER QUADRANT COMPARISON:  CT abdomen and pelvis 02/28/2007 FINDINGS: Gallbladder: Gallbladder is mildly distended with diffuse gallbladder wall thickening and edema. Small sludge demonstrated in the dependent gallbladder. No discrete stones. Murphy's sign is negative. Common bile duct: Diameter: 5.4 mm, normal. Distal bile duct is obscured by bowel gas. Liver: No focal lesion identified. Within normal limits in parenchymal echogenicity. Portal vein is patent on color Doppler imaging with normal direction of blood flow towards the liver. IMPRESSION: Nonspecific finding with distended gallbladder, thickened and edematous gallbladder wall, and gallbladder sludge but no stone and negative Murphy's sign. Changes could represent chronic inflammatory process. Consider radionuclide biliary scan to evaluate gallbladder function if clinically indicated. Electronically Signed   By: Lucienne Capers M.D.   On: 10/30/2017 03:43      Subjective: No  complaints, would like to go home.   Discharge Exam: Vitals:   11/02/17 2114 11/03/17 0534  BP: (!) 155/83 132/71  Pulse: 88 72  Resp: 17 18  Temp: 97.7 F (36.5 C) (!) 97.5 F (36.4 C)  SpO2: 98% 98%   Vitals:   11/02/17 1316 11/02/17 2114 11/03/17 0500 11/03/17 0534  BP: 124/65 (!) 155/83  132/71  Pulse: 70 88  72  Resp: 20 17  18   Temp:  97.7 F (36.5 C)  (!) 97.5 F (36.4 C)  TempSrc:  Oral  Oral  SpO2: 99% 98%  98%  Weight:   78.5 kg   Height:        General: Pt is alert, awake, not in acute distress; elderly frail appearing.  Cardiovascular: RRR, S1/S2 +, no rubs, no gallops Respiratory: CTA bilaterally, no wheezing, no rhonchi Abdominal: Soft, NT, ND, bowel sounds + Extremities: no edema, no cyanosis    The results of significant diagnostics from this hospitalization (including imaging, microbiology, ancillary and laboratory) are listed below for reference.     Microbiology: No results found for this or any previous visit (from the past 240 hour(s)).   Labs: BNP (last 3 results) No results for input(s): BNP in the last 8760 hours. Basic Metabolic Panel: Recent Labs  Lab 10/30/17 0110 10/31/17 7829 11/01/17 5621  11/02/17 0447 11/03/17 0854  NA 133* 134* 134* 132* 133*  K 4.4 3.9 4.0 4.5 3.3*  CL 101 103 105 104 103  CO2 25 26 24 25 24   GLUCOSE 103* 77 69* 153* 74  BUN 17 18 18  26* 22  CREATININE 1.34* 1.34* 1.37* 1.47* 1.28*  CALCIUM 8.4* 8.3* 8.0* 7.9* 8.2*  MG  --  1.9  --  2.2 1.9  PHOS  --  3.2  --   --   --    Liver Function Tests: Recent Labs  Lab 10/30/17 0110 10/31/17 0652 11/01/17 0623 11/02/17 0447 11/03/17 0854  AST 193* 100* 52* 29 35  ALT 204* 126* 83* 67* 58*  ALKPHOS 296* 318* 290* 250* 238*  BILITOT 4.8* 6.0* 3.0* 1.6* 1.6*  PROT 5.3* 5.2* 4.9* 5.1* 5.3*  ALBUMIN 2.8* 2.7* 2.4* 2.3* 2.6*   Recent Labs  Lab 10/30/17 0110 10/31/17 0652 11/01/17 0623  LIPASE 1,145* 380* 160*   No results for input(s): AMMONIA in  the last 168 hours. CBC: Recent Labs  Lab 10/30/17 0110 10/31/17 0652 11/01/17 0623 11/02/17 0447 11/03/17 0854  WBC 13.0* 9.6 12.1* 8.3 9.4  NEUTROABS 11.3*  --   --   --   --   HGB 10.0* 9.9* 9.2* 9.4* 10.2*  HCT 30.3* 30.3* 28.0* 28.5* 31.5*  MCV 94.7 95.3 94.9 93.8 95.2  PLT 144* 132* 126* 128* 195   Cardiac Enzymes: No results for input(s): CKTOTAL, CKMB, CKMBINDEX, TROPONINI in the last 168 hours. BNP: Invalid input(s): POCBNP CBG: Recent Labs  Lab 10/30/17 0757 10/31/17 0735 11/01/17 1200 11/02/17 0804  GLUCAP 79 74 87 139*   D-Dimer No results for input(s): DDIMER in the last 72 hours. Hgb A1c No results for input(s): HGBA1C in the last 72 hours. Lipid Profile No results for input(s): CHOL, HDL, LDLCALC, TRIG, CHOLHDL, LDLDIRECT in the last 72 hours. Thyroid function studies No results for input(s): TSH, T4TOTAL, T3FREE, THYROIDAB in the last 72 hours.  Invalid input(s): FREET3 Anemia work up No results for input(s): VITAMINB12, FOLATE, FERRITIN, TIBC, IRON, RETICCTPCT in the last 72 hours. Urinalysis No results found for: COLORURINE, APPEARANCEUR, LABSPEC, [place], GLUCOSEU, HGBUR, BILIRUBINUR, KETONESUR, PROTEINUR, UROBILINOGEN, NITRITE, LEUKOCYTESUR Sepsis Labs Invalid input(s): PROCALCITONIN,  WBC,  LACTICIDVEN Microbiology No results found for this or any previous visit (from the past 240 hour(s)).   Time coordinating discharge:  I have spent 35 minutes face to face with the patient and on the ward discussing the patients care, assessment, plan and disposition with other care givers. >50% of the time was devoted counseling the patient about the risks and benefits of treatment/Discharge disposition and coordinating care.   SIGNED:   Damita Lack, MD  [facility] 11/03/2017, 3:34 PM Pager   If 7PM-7AM, please contact night-coverage [email or web address] Password [email or web address]

## 2017-11-03 NOTE — Discharge Instructions (Signed)
Low-Fat Diet for Pancreatitis or Gallbladder Conditions A low-fat diet can be helpful if you have pancreatitis or a gallbladder condition. With these conditions, your pancreas and gallbladder have trouble digesting fats. A healthy eating plan with less fat will help rest your pancreas and gallbladder and reduce your symptoms. What do I need to know about this diet?  Eat a low-fat diet. ? Reduce your fat intake to less than 20-30% of your total daily calories. This is less than 50-60 g of fat per day. ? Remember that you need some fat in your diet. Ask your dietician what your daily goal should be. ? Choose nonfat and low-fat healthy foods. Look for the words "nonfat," "low fat," or "fat free." ? As a guide, look on the label and choose foods with less than 3 g of fat per serving. Eat only one serving.  Avoid alcohol.  Do not smoke. If you need help quitting, talk with your health care provider.  Eat small frequent meals instead of three large heavy meals. What foods can I eat? Grains Include healthy grains and starches such as potatoes, wheat bread, fiber-rich cereal, and brown rice. Choose whole grain options whenever possible. In adults, whole grains should account for 45-65% of your daily calories. Fruits and Vegetables Eat plenty of fruits and vegetables. Fresh fruits and vegetables add fiber to your diet. Meats and Other Protein Sources Eat lean meat such as chicken and pork. Trim any fat off of meat before cooking it. Eggs, fish, and beans are other sources of protein. In adults, these foods should account for 10-35% of your daily calories. Dairy Choose low-fat milk and dairy options. Dairy includes fat and protein, as well as calcium. Fats and Oils Limit high-fat foods such as fried foods, sweets, baked goods, sugary drinks. Other Creamy sauces and condiments, such as mayonnaise, can add extra fat. Think about whether or not you need to use them, or use smaller amounts or low fat  options. What foods are not recommended?  High fat foods, such as: ? Baked goods. ? Ice cream. ? French toast. ? Sweet rolls. ? Pizza. ? Cheese bread. ? Foods covered with batter, butter, creamy sauces, or cheese. ? Fried foods. ? Sugary drinks and desserts.  Foods that cause gas or bloating This information is not intended to replace advice given to you by your health care provider. Make sure you discuss any questions you have with your health care provider. Document Released: 03/06/2013 Document Revised: 08/07/2015 Document Reviewed: 02/12/2013 Elsevier Interactive Patient Education  2017 Elsevier Inc.  

## 2017-11-03 NOTE — Consult Note (Signed)
            [facility] CM Primary Care Navigator  11/03/2017  Wesley Dyer Jun 16, 1923 [id number]   Went to see patientat the bedside to identify possible discharge needs buthe wasalreadydischarge per staff.  Patientwas discharged to skilled nursing facility per therapy recommendation ([facility]).  Per chart review,patientcame here from [facility] for evaluation of upper abdominal pain, nausea with vomiting and was found to have gallstone pancreatitis. (acute pancreatitis, chronic cholecystitis without calculus status post ERCP- Endoscopic Retrograde Cholangiopancreatography)  Primary care provider's office is listed as providing transition of care (TOC) follow-up.  Patient has discharge instruction to follow-up withprimary care provider in 1 week.   For additional questions please contact:  [name], BSN, RN-BC [facility] PRIMARY CARE Navigator Cell: [phone]

## 2017-11-04 DIAGNOSIS — I10 Essential (primary) hypertension: Secondary | ICD-10-CM | POA: Diagnosis not present

## 2017-11-04 DIAGNOSIS — F419 Anxiety disorder, unspecified: Secondary | ICD-10-CM | POA: Diagnosis not present

## 2017-11-04 DIAGNOSIS — M6281 Muscle weakness (generalized): Secondary | ICD-10-CM | POA: Diagnosis not present

## 2017-11-04 DIAGNOSIS — E039 Hypothyroidism, unspecified: Secondary | ICD-10-CM | POA: Diagnosis not present

## 2017-11-04 DIAGNOSIS — Z79899 Other long term (current) drug therapy: Secondary | ICD-10-CM | POA: Diagnosis not present

## 2017-11-04 DIAGNOSIS — Z7901 Long term (current) use of anticoagulants: Secondary | ICD-10-CM | POA: Diagnosis not present

## 2017-11-04 DIAGNOSIS — Z950 Presence of cardiac pacemaker: Secondary | ICD-10-CM | POA: Diagnosis not present

## 2017-11-04 DIAGNOSIS — Z9181 History of falling: Secondary | ICD-10-CM | POA: Diagnosis not present

## 2017-11-04 DIAGNOSIS — K851 Biliary acute pancreatitis without necrosis or infection: Secondary | ICD-10-CM | POA: Diagnosis not present

## 2017-11-04 DIAGNOSIS — K811 Chronic cholecystitis: Secondary | ICD-10-CM | POA: Diagnosis not present

## 2017-11-04 DIAGNOSIS — I480 Paroxysmal atrial fibrillation: Secondary | ICD-10-CM | POA: Diagnosis not present

## 2017-11-04 DIAGNOSIS — F0390 Unspecified dementia without behavioral disturbance: Secondary | ICD-10-CM | POA: Diagnosis not present

## 2017-11-04 DIAGNOSIS — Z8673 Personal history of transient ischemic attack (TIA), and cerebral infarction without residual deficits: Secondary | ICD-10-CM | POA: Diagnosis not present

## 2017-11-04 DIAGNOSIS — I495 Sick sinus syndrome: Secondary | ICD-10-CM | POA: Diagnosis not present

## 2017-11-07 DIAGNOSIS — M6281 Muscle weakness (generalized): Secondary | ICD-10-CM | POA: Diagnosis not present

## 2017-11-07 DIAGNOSIS — F0390 Unspecified dementia without behavioral disturbance: Secondary | ICD-10-CM | POA: Diagnosis not present

## 2017-11-07 DIAGNOSIS — K811 Chronic cholecystitis: Secondary | ICD-10-CM | POA: Diagnosis not present

## 2017-11-07 DIAGNOSIS — K851 Biliary acute pancreatitis without necrosis or infection: Secondary | ICD-10-CM | POA: Diagnosis not present

## 2017-11-07 DIAGNOSIS — I480 Paroxysmal atrial fibrillation: Secondary | ICD-10-CM | POA: Diagnosis not present

## 2017-11-07 DIAGNOSIS — I10 Essential (primary) hypertension: Secondary | ICD-10-CM | POA: Diagnosis not present

## 2017-11-08 DIAGNOSIS — M6281 Muscle weakness (generalized): Secondary | ICD-10-CM | POA: Diagnosis not present

## 2017-11-08 DIAGNOSIS — I480 Paroxysmal atrial fibrillation: Secondary | ICD-10-CM | POA: Diagnosis not present

## 2017-11-08 DIAGNOSIS — I10 Essential (primary) hypertension: Secondary | ICD-10-CM | POA: Diagnosis not present

## 2017-11-08 DIAGNOSIS — K851 Biliary acute pancreatitis without necrosis or infection: Secondary | ICD-10-CM | POA: Diagnosis not present

## 2017-11-08 DIAGNOSIS — F0390 Unspecified dementia without behavioral disturbance: Secondary | ICD-10-CM | POA: Diagnosis not present

## 2017-11-08 DIAGNOSIS — K811 Chronic cholecystitis: Secondary | ICD-10-CM | POA: Diagnosis not present

## 2017-11-09 DIAGNOSIS — M6281 Muscle weakness (generalized): Secondary | ICD-10-CM | POA: Diagnosis not present

## 2017-11-09 DIAGNOSIS — F0390 Unspecified dementia without behavioral disturbance: Secondary | ICD-10-CM | POA: Diagnosis not present

## 2017-11-09 DIAGNOSIS — I10 Essential (primary) hypertension: Secondary | ICD-10-CM | POA: Diagnosis not present

## 2017-11-09 DIAGNOSIS — I480 Paroxysmal atrial fibrillation: Secondary | ICD-10-CM | POA: Diagnosis not present

## 2017-11-09 DIAGNOSIS — K851 Biliary acute pancreatitis without necrosis or infection: Secondary | ICD-10-CM | POA: Diagnosis not present

## 2017-11-09 DIAGNOSIS — K811 Chronic cholecystitis: Secondary | ICD-10-CM | POA: Diagnosis not present

## 2017-11-10 DIAGNOSIS — K810 Acute cholecystitis: Secondary | ICD-10-CM | POA: Diagnosis not present

## 2017-11-10 DIAGNOSIS — F419 Anxiety disorder, unspecified: Secondary | ICD-10-CM | POA: Diagnosis not present

## 2017-11-10 DIAGNOSIS — N183 Chronic kidney disease, stage 3 (moderate): Secondary | ICD-10-CM | POA: Diagnosis not present

## 2017-11-10 DIAGNOSIS — I482 Chronic atrial fibrillation: Secondary | ICD-10-CM | POA: Diagnosis not present

## 2017-11-10 DIAGNOSIS — D649 Anemia, unspecified: Secondary | ICD-10-CM | POA: Diagnosis not present

## 2017-11-10 DIAGNOSIS — E559 Vitamin D deficiency, unspecified: Secondary | ICD-10-CM | POA: Diagnosis not present

## 2017-11-10 DIAGNOSIS — F0390 Unspecified dementia without behavioral disturbance: Secondary | ICD-10-CM | POA: Diagnosis not present

## 2017-11-10 DIAGNOSIS — K219 Gastro-esophageal reflux disease without esophagitis: Secondary | ICD-10-CM | POA: Diagnosis not present

## 2017-11-10 DIAGNOSIS — F0631 Mood disorder due to known physiological condition with depressive features: Secondary | ICD-10-CM | POA: Diagnosis not present

## 2017-11-10 DIAGNOSIS — I480 Paroxysmal atrial fibrillation: Secondary | ICD-10-CM | POA: Diagnosis not present

## 2017-11-10 DIAGNOSIS — K811 Chronic cholecystitis: Secondary | ICD-10-CM | POA: Diagnosis not present

## 2017-11-10 DIAGNOSIS — M6281 Muscle weakness (generalized): Secondary | ICD-10-CM | POA: Diagnosis not present

## 2017-11-10 DIAGNOSIS — I495 Sick sinus syndrome: Secondary | ICD-10-CM | POA: Diagnosis not present

## 2017-11-10 DIAGNOSIS — K851 Biliary acute pancreatitis without necrosis or infection: Secondary | ICD-10-CM | POA: Diagnosis not present

## 2017-11-10 DIAGNOSIS — E039 Hypothyroidism, unspecified: Secondary | ICD-10-CM | POA: Diagnosis not present

## 2017-11-10 DIAGNOSIS — I10 Essential (primary) hypertension: Secondary | ICD-10-CM | POA: Diagnosis not present

## 2017-11-10 DIAGNOSIS — E871 Hypo-osmolality and hyponatremia: Secondary | ICD-10-CM | POA: Diagnosis not present

## 2017-11-10 DIAGNOSIS — F05 Delirium due to known physiological condition: Secondary | ICD-10-CM | POA: Diagnosis not present

## 2017-11-10 DIAGNOSIS — N401 Enlarged prostate with lower urinary tract symptoms: Secondary | ICD-10-CM | POA: Diagnosis not present

## 2017-11-11 DIAGNOSIS — K811 Chronic cholecystitis: Secondary | ICD-10-CM | POA: Diagnosis not present

## 2017-11-11 DIAGNOSIS — M6281 Muscle weakness (generalized): Secondary | ICD-10-CM | POA: Diagnosis not present

## 2017-11-11 DIAGNOSIS — K851 Biliary acute pancreatitis without necrosis or infection: Secondary | ICD-10-CM | POA: Diagnosis not present

## 2017-11-11 DIAGNOSIS — F0390 Unspecified dementia without behavioral disturbance: Secondary | ICD-10-CM | POA: Diagnosis not present

## 2017-11-11 DIAGNOSIS — I480 Paroxysmal atrial fibrillation: Secondary | ICD-10-CM | POA: Diagnosis not present

## 2017-11-11 DIAGNOSIS — I10 Essential (primary) hypertension: Secondary | ICD-10-CM | POA: Diagnosis not present

## 2017-11-14 DIAGNOSIS — M6281 Muscle weakness (generalized): Secondary | ICD-10-CM | POA: Diagnosis not present

## 2017-11-14 DIAGNOSIS — K811 Chronic cholecystitis: Secondary | ICD-10-CM | POA: Diagnosis not present

## 2017-11-14 DIAGNOSIS — I10 Essential (primary) hypertension: Secondary | ICD-10-CM | POA: Diagnosis not present

## 2017-11-14 DIAGNOSIS — K851 Biliary acute pancreatitis without necrosis or infection: Secondary | ICD-10-CM | POA: Diagnosis not present

## 2017-11-14 DIAGNOSIS — F0390 Unspecified dementia without behavioral disturbance: Secondary | ICD-10-CM | POA: Diagnosis not present

## 2017-11-14 DIAGNOSIS — I480 Paroxysmal atrial fibrillation: Secondary | ICD-10-CM | POA: Diagnosis not present

## 2017-11-15 DIAGNOSIS — I480 Paroxysmal atrial fibrillation: Secondary | ICD-10-CM | POA: Diagnosis not present

## 2017-11-15 DIAGNOSIS — K851 Biliary acute pancreatitis without necrosis or infection: Secondary | ICD-10-CM | POA: Diagnosis not present

## 2017-11-15 DIAGNOSIS — K811 Chronic cholecystitis: Secondary | ICD-10-CM | POA: Diagnosis not present

## 2017-11-15 DIAGNOSIS — F0390 Unspecified dementia without behavioral disturbance: Secondary | ICD-10-CM | POA: Diagnosis not present

## 2017-11-15 DIAGNOSIS — I10 Essential (primary) hypertension: Secondary | ICD-10-CM | POA: Diagnosis not present

## 2017-11-15 DIAGNOSIS — M6281 Muscle weakness (generalized): Secondary | ICD-10-CM | POA: Diagnosis not present

## 2017-11-16 DIAGNOSIS — K811 Chronic cholecystitis: Secondary | ICD-10-CM | POA: Diagnosis not present

## 2017-11-16 DIAGNOSIS — F0390 Unspecified dementia without behavioral disturbance: Secondary | ICD-10-CM | POA: Diagnosis not present

## 2017-11-16 DIAGNOSIS — I480 Paroxysmal atrial fibrillation: Secondary | ICD-10-CM | POA: Diagnosis not present

## 2017-11-16 DIAGNOSIS — M6281 Muscle weakness (generalized): Secondary | ICD-10-CM | POA: Diagnosis not present

## 2017-11-16 DIAGNOSIS — I10 Essential (primary) hypertension: Secondary | ICD-10-CM | POA: Diagnosis not present

## 2017-11-16 DIAGNOSIS — K851 Biliary acute pancreatitis without necrosis or infection: Secondary | ICD-10-CM | POA: Diagnosis not present

## 2017-11-17 DIAGNOSIS — F0390 Unspecified dementia without behavioral disturbance: Secondary | ICD-10-CM | POA: Diagnosis not present

## 2017-11-17 DIAGNOSIS — I480 Paroxysmal atrial fibrillation: Secondary | ICD-10-CM | POA: Diagnosis not present

## 2017-11-17 DIAGNOSIS — K851 Biliary acute pancreatitis without necrosis or infection: Secondary | ICD-10-CM | POA: Diagnosis not present

## 2017-11-17 DIAGNOSIS — I10 Essential (primary) hypertension: Secondary | ICD-10-CM | POA: Diagnosis not present

## 2017-11-17 DIAGNOSIS — K811 Chronic cholecystitis: Secondary | ICD-10-CM | POA: Diagnosis not present

## 2017-11-17 DIAGNOSIS — M6281 Muscle weakness (generalized): Secondary | ICD-10-CM | POA: Diagnosis not present

## 2017-11-21 DIAGNOSIS — I480 Paroxysmal atrial fibrillation: Secondary | ICD-10-CM | POA: Diagnosis not present

## 2017-11-21 DIAGNOSIS — K811 Chronic cholecystitis: Secondary | ICD-10-CM | POA: Diagnosis not present

## 2017-11-21 DIAGNOSIS — F0390 Unspecified dementia without behavioral disturbance: Secondary | ICD-10-CM | POA: Diagnosis not present

## 2017-11-21 DIAGNOSIS — I10 Essential (primary) hypertension: Secondary | ICD-10-CM | POA: Diagnosis not present

## 2017-11-21 DIAGNOSIS — K851 Biliary acute pancreatitis without necrosis or infection: Secondary | ICD-10-CM | POA: Diagnosis not present

## 2017-11-21 DIAGNOSIS — M6281 Muscle weakness (generalized): Secondary | ICD-10-CM | POA: Diagnosis not present

## 2017-11-22 DIAGNOSIS — F0390 Unspecified dementia without behavioral disturbance: Secondary | ICD-10-CM | POA: Diagnosis not present

## 2017-11-22 DIAGNOSIS — K811 Chronic cholecystitis: Secondary | ICD-10-CM | POA: Diagnosis not present

## 2017-11-22 DIAGNOSIS — I10 Essential (primary) hypertension: Secondary | ICD-10-CM | POA: Diagnosis not present

## 2017-11-22 DIAGNOSIS — K851 Biliary acute pancreatitis without necrosis or infection: Secondary | ICD-10-CM | POA: Diagnosis not present

## 2017-11-22 DIAGNOSIS — I480 Paroxysmal atrial fibrillation: Secondary | ICD-10-CM | POA: Diagnosis not present

## 2017-11-22 DIAGNOSIS — M6281 Muscle weakness (generalized): Secondary | ICD-10-CM | POA: Diagnosis not present

## 2017-11-23 DIAGNOSIS — K811 Chronic cholecystitis: Secondary | ICD-10-CM | POA: Diagnosis not present

## 2017-11-23 DIAGNOSIS — F0390 Unspecified dementia without behavioral disturbance: Secondary | ICD-10-CM | POA: Diagnosis not present

## 2017-11-23 DIAGNOSIS — I480 Paroxysmal atrial fibrillation: Secondary | ICD-10-CM | POA: Diagnosis not present

## 2017-11-23 DIAGNOSIS — K851 Biliary acute pancreatitis without necrosis or infection: Secondary | ICD-10-CM | POA: Diagnosis not present

## 2017-11-23 DIAGNOSIS — I10 Essential (primary) hypertension: Secondary | ICD-10-CM | POA: Diagnosis not present

## 2017-11-23 DIAGNOSIS — M6281 Muscle weakness (generalized): Secondary | ICD-10-CM | POA: Diagnosis not present

## 2017-11-24 DIAGNOSIS — I480 Paroxysmal atrial fibrillation: Secondary | ICD-10-CM | POA: Diagnosis not present

## 2017-11-24 DIAGNOSIS — F0390 Unspecified dementia without behavioral disturbance: Secondary | ICD-10-CM | POA: Diagnosis not present

## 2017-11-24 DIAGNOSIS — K851 Biliary acute pancreatitis without necrosis or infection: Secondary | ICD-10-CM | POA: Diagnosis not present

## 2017-11-24 DIAGNOSIS — M6281 Muscle weakness (generalized): Secondary | ICD-10-CM | POA: Diagnosis not present

## 2017-11-24 DIAGNOSIS — I10 Essential (primary) hypertension: Secondary | ICD-10-CM | POA: Diagnosis not present

## 2017-11-24 DIAGNOSIS — K811 Chronic cholecystitis: Secondary | ICD-10-CM | POA: Diagnosis not present

## 2017-11-28 DIAGNOSIS — I10 Essential (primary) hypertension: Secondary | ICD-10-CM | POA: Diagnosis not present

## 2017-11-28 DIAGNOSIS — I480 Paroxysmal atrial fibrillation: Secondary | ICD-10-CM | POA: Diagnosis not present

## 2017-11-28 DIAGNOSIS — K811 Chronic cholecystitis: Secondary | ICD-10-CM | POA: Diagnosis not present

## 2017-11-28 DIAGNOSIS — F0390 Unspecified dementia without behavioral disturbance: Secondary | ICD-10-CM | POA: Diagnosis not present

## 2017-11-28 DIAGNOSIS — M6281 Muscle weakness (generalized): Secondary | ICD-10-CM | POA: Diagnosis not present

## 2017-11-28 DIAGNOSIS — K851 Biliary acute pancreatitis without necrosis or infection: Secondary | ICD-10-CM | POA: Diagnosis not present

## 2017-12-01 DIAGNOSIS — Z23 Encounter for immunization: Secondary | ICD-10-CM | POA: Diagnosis not present

## 2017-12-03 DIAGNOSIS — N401 Enlarged prostate with lower urinary tract symptoms: Secondary | ICD-10-CM | POA: Diagnosis not present

## 2017-12-03 DIAGNOSIS — E039 Hypothyroidism, unspecified: Secondary | ICD-10-CM | POA: Diagnosis not present

## 2017-12-03 DIAGNOSIS — I482 Chronic atrial fibrillation: Secondary | ICD-10-CM | POA: Diagnosis not present

## 2017-12-03 DIAGNOSIS — I495 Sick sinus syndrome: Secondary | ICD-10-CM | POA: Diagnosis not present

## 2017-12-03 DIAGNOSIS — F419 Anxiety disorder, unspecified: Secondary | ICD-10-CM | POA: Diagnosis not present

## 2017-12-03 DIAGNOSIS — K810 Acute cholecystitis: Secondary | ICD-10-CM | POA: Diagnosis not present

## 2017-12-03 DIAGNOSIS — K851 Biliary acute pancreatitis without necrosis or infection: Secondary | ICD-10-CM | POA: Diagnosis not present

## 2017-12-03 DIAGNOSIS — F05 Delirium due to known physiological condition: Secondary | ICD-10-CM | POA: Diagnosis not present

## 2017-12-06 DIAGNOSIS — K851 Biliary acute pancreatitis without necrosis or infection: Secondary | ICD-10-CM | POA: Diagnosis not present

## 2017-12-06 DIAGNOSIS — K811 Chronic cholecystitis: Secondary | ICD-10-CM | POA: Diagnosis not present

## 2017-12-06 DIAGNOSIS — M6281 Muscle weakness (generalized): Secondary | ICD-10-CM | POA: Diagnosis not present

## 2017-12-06 DIAGNOSIS — I10 Essential (primary) hypertension: Secondary | ICD-10-CM | POA: Diagnosis not present

## 2017-12-06 DIAGNOSIS — I480 Paroxysmal atrial fibrillation: Secondary | ICD-10-CM | POA: Diagnosis not present

## 2017-12-06 DIAGNOSIS — F0390 Unspecified dementia without behavioral disturbance: Secondary | ICD-10-CM | POA: Diagnosis not present

## 2017-12-08 DIAGNOSIS — I10 Essential (primary) hypertension: Secondary | ICD-10-CM | POA: Diagnosis not present

## 2017-12-08 DIAGNOSIS — M6281 Muscle weakness (generalized): Secondary | ICD-10-CM | POA: Diagnosis not present

## 2017-12-08 DIAGNOSIS — K811 Chronic cholecystitis: Secondary | ICD-10-CM | POA: Diagnosis not present

## 2017-12-08 DIAGNOSIS — I480 Paroxysmal atrial fibrillation: Secondary | ICD-10-CM | POA: Diagnosis not present

## 2017-12-08 DIAGNOSIS — K851 Biliary acute pancreatitis without necrosis or infection: Secondary | ICD-10-CM | POA: Diagnosis not present

## 2017-12-08 DIAGNOSIS — F0390 Unspecified dementia without behavioral disturbance: Secondary | ICD-10-CM | POA: Diagnosis not present

## 2017-12-13 DIAGNOSIS — K811 Chronic cholecystitis: Secondary | ICD-10-CM | POA: Diagnosis not present

## 2017-12-13 DIAGNOSIS — K851 Biliary acute pancreatitis without necrosis or infection: Secondary | ICD-10-CM | POA: Diagnosis not present

## 2017-12-13 DIAGNOSIS — M6281 Muscle weakness (generalized): Secondary | ICD-10-CM | POA: Diagnosis not present

## 2017-12-13 DIAGNOSIS — I480 Paroxysmal atrial fibrillation: Secondary | ICD-10-CM | POA: Diagnosis not present

## 2017-12-13 DIAGNOSIS — I10 Essential (primary) hypertension: Secondary | ICD-10-CM | POA: Diagnosis not present

## 2017-12-13 DIAGNOSIS — F0390 Unspecified dementia without behavioral disturbance: Secondary | ICD-10-CM | POA: Diagnosis not present

## 2017-12-15 DIAGNOSIS — I10 Essential (primary) hypertension: Secondary | ICD-10-CM | POA: Diagnosis not present

## 2017-12-15 DIAGNOSIS — M6281 Muscle weakness (generalized): Secondary | ICD-10-CM | POA: Diagnosis not present

## 2017-12-15 DIAGNOSIS — K851 Biliary acute pancreatitis without necrosis or infection: Secondary | ICD-10-CM | POA: Diagnosis not present

## 2017-12-15 DIAGNOSIS — F0390 Unspecified dementia without behavioral disturbance: Secondary | ICD-10-CM | POA: Diagnosis not present

## 2017-12-15 DIAGNOSIS — K811 Chronic cholecystitis: Secondary | ICD-10-CM | POA: Diagnosis not present

## 2017-12-15 DIAGNOSIS — I480 Paroxysmal atrial fibrillation: Secondary | ICD-10-CM | POA: Diagnosis not present

## 2017-12-19 DIAGNOSIS — K851 Biliary acute pancreatitis without necrosis or infection: Secondary | ICD-10-CM | POA: Diagnosis not present

## 2017-12-19 DIAGNOSIS — F0390 Unspecified dementia without behavioral disturbance: Secondary | ICD-10-CM | POA: Diagnosis not present

## 2017-12-19 DIAGNOSIS — K811 Chronic cholecystitis: Secondary | ICD-10-CM | POA: Diagnosis not present

## 2017-12-19 DIAGNOSIS — I10 Essential (primary) hypertension: Secondary | ICD-10-CM | POA: Diagnosis not present

## 2017-12-19 DIAGNOSIS — I480 Paroxysmal atrial fibrillation: Secondary | ICD-10-CM | POA: Diagnosis not present

## 2017-12-19 DIAGNOSIS — M6281 Muscle weakness (generalized): Secondary | ICD-10-CM | POA: Diagnosis not present

## 2017-12-20 DIAGNOSIS — K811 Chronic cholecystitis: Secondary | ICD-10-CM | POA: Diagnosis not present

## 2017-12-20 DIAGNOSIS — I10 Essential (primary) hypertension: Secondary | ICD-10-CM | POA: Diagnosis not present

## 2017-12-20 DIAGNOSIS — F0390 Unspecified dementia without behavioral disturbance: Secondary | ICD-10-CM | POA: Diagnosis not present

## 2017-12-20 DIAGNOSIS — M6281 Muscle weakness (generalized): Secondary | ICD-10-CM | POA: Diagnosis not present

## 2017-12-20 DIAGNOSIS — K851 Biliary acute pancreatitis without necrosis or infection: Secondary | ICD-10-CM | POA: Diagnosis not present

## 2017-12-20 DIAGNOSIS — I480 Paroxysmal atrial fibrillation: Secondary | ICD-10-CM | POA: Diagnosis not present

## 2017-12-21 DIAGNOSIS — M6281 Muscle weakness (generalized): Secondary | ICD-10-CM | POA: Diagnosis not present

## 2017-12-21 DIAGNOSIS — K811 Chronic cholecystitis: Secondary | ICD-10-CM | POA: Diagnosis not present

## 2017-12-21 DIAGNOSIS — I480 Paroxysmal atrial fibrillation: Secondary | ICD-10-CM | POA: Diagnosis not present

## 2017-12-21 DIAGNOSIS — K851 Biliary acute pancreatitis without necrosis or infection: Secondary | ICD-10-CM | POA: Diagnosis not present

## 2017-12-21 DIAGNOSIS — F0390 Unspecified dementia without behavioral disturbance: Secondary | ICD-10-CM | POA: Diagnosis not present

## 2017-12-21 DIAGNOSIS — I10 Essential (primary) hypertension: Secondary | ICD-10-CM | POA: Diagnosis not present

## 2017-12-27 DIAGNOSIS — M6281 Muscle weakness (generalized): Secondary | ICD-10-CM | POA: Diagnosis not present

## 2017-12-27 DIAGNOSIS — F0390 Unspecified dementia without behavioral disturbance: Secondary | ICD-10-CM | POA: Diagnosis not present

## 2017-12-27 DIAGNOSIS — I10 Essential (primary) hypertension: Secondary | ICD-10-CM | POA: Diagnosis not present

## 2017-12-27 DIAGNOSIS — K811 Chronic cholecystitis: Secondary | ICD-10-CM | POA: Diagnosis not present

## 2017-12-27 DIAGNOSIS — K851 Biliary acute pancreatitis without necrosis or infection: Secondary | ICD-10-CM | POA: Diagnosis not present

## 2017-12-27 DIAGNOSIS — I480 Paroxysmal atrial fibrillation: Secondary | ICD-10-CM | POA: Diagnosis not present

## 2017-12-30 DIAGNOSIS — I10 Essential (primary) hypertension: Secondary | ICD-10-CM | POA: Diagnosis not present

## 2017-12-30 DIAGNOSIS — K811 Chronic cholecystitis: Secondary | ICD-10-CM | POA: Diagnosis not present

## 2017-12-30 DIAGNOSIS — I480 Paroxysmal atrial fibrillation: Secondary | ICD-10-CM | POA: Diagnosis not present

## 2017-12-30 DIAGNOSIS — F0390 Unspecified dementia without behavioral disturbance: Secondary | ICD-10-CM | POA: Diagnosis not present

## 2017-12-30 DIAGNOSIS — M6281 Muscle weakness (generalized): Secondary | ICD-10-CM | POA: Diagnosis not present

## 2017-12-30 DIAGNOSIS — K851 Biliary acute pancreatitis without necrosis or infection: Secondary | ICD-10-CM | POA: Diagnosis not present

## 2018-01-02 DIAGNOSIS — K851 Biliary acute pancreatitis without necrosis or infection: Secondary | ICD-10-CM | POA: Diagnosis not present

## 2018-01-02 DIAGNOSIS — K811 Chronic cholecystitis: Secondary | ICD-10-CM | POA: Diagnosis not present

## 2018-01-02 DIAGNOSIS — I480 Paroxysmal atrial fibrillation: Secondary | ICD-10-CM | POA: Diagnosis not present

## 2018-01-02 DIAGNOSIS — M6281 Muscle weakness (generalized): Secondary | ICD-10-CM | POA: Diagnosis not present

## 2018-01-02 DIAGNOSIS — F0390 Unspecified dementia without behavioral disturbance: Secondary | ICD-10-CM | POA: Diagnosis not present

## 2018-01-02 DIAGNOSIS — I10 Essential (primary) hypertension: Secondary | ICD-10-CM | POA: Diagnosis not present

## 2018-01-05 DIAGNOSIS — L110 Acquired keratosis follicularis: Secondary | ICD-10-CM | POA: Diagnosis not present

## 2018-01-05 DIAGNOSIS — I739 Peripheral vascular disease, unspecified: Secondary | ICD-10-CM | POA: Diagnosis not present

## 2018-01-05 DIAGNOSIS — L609 Nail disorder, unspecified: Secondary | ICD-10-CM | POA: Diagnosis not present

## 2018-01-12 ENCOUNTER — Other Ambulatory Visit: Payer: Self-pay | Admitting: Cardiology

## 2018-01-20 DIAGNOSIS — E559 Vitamin D deficiency, unspecified: Secondary | ICD-10-CM | POA: Diagnosis not present

## 2018-01-20 DIAGNOSIS — E871 Hypo-osmolality and hyponatremia: Secondary | ICD-10-CM | POA: Diagnosis not present

## 2018-01-20 DIAGNOSIS — K219 Gastro-esophageal reflux disease without esophagitis: Secondary | ICD-10-CM | POA: Diagnosis not present

## 2018-01-20 DIAGNOSIS — E039 Hypothyroidism, unspecified: Secondary | ICD-10-CM | POA: Diagnosis not present

## 2018-01-20 DIAGNOSIS — N183 Chronic kidney disease, stage 3 (moderate): Secondary | ICD-10-CM | POA: Diagnosis not present

## 2018-01-24 DIAGNOSIS — F0631 Mood disorder due to known physiological condition with depressive features: Secondary | ICD-10-CM | POA: Diagnosis not present

## 2018-01-24 DIAGNOSIS — Z6824 Body mass index (BMI) 24.0-24.9, adult: Secondary | ICD-10-CM | POA: Diagnosis not present

## 2018-01-24 DIAGNOSIS — F0390 Unspecified dementia without behavioral disturbance: Secondary | ICD-10-CM | POA: Diagnosis not present

## 2018-01-24 DIAGNOSIS — E871 Hypo-osmolality and hyponatremia: Secondary | ICD-10-CM | POA: Diagnosis not present

## 2018-01-24 DIAGNOSIS — Z23 Encounter for immunization: Secondary | ICD-10-CM | POA: Diagnosis not present

## 2018-01-24 DIAGNOSIS — F419 Anxiety disorder, unspecified: Secondary | ICD-10-CM | POA: Diagnosis not present

## 2018-01-24 DIAGNOSIS — E039 Hypothyroidism, unspecified: Secondary | ICD-10-CM | POA: Diagnosis not present

## 2018-01-24 DIAGNOSIS — F339 Major depressive disorder, recurrent, unspecified: Secondary | ICD-10-CM | POA: Diagnosis not present

## 2018-03-29 DIAGNOSIS — L110 Acquired keratosis follicularis: Secondary | ICD-10-CM | POA: Diagnosis not present

## 2018-03-29 DIAGNOSIS — I739 Peripheral vascular disease, unspecified: Secondary | ICD-10-CM | POA: Diagnosis not present

## 2018-03-29 DIAGNOSIS — L609 Nail disorder, unspecified: Secondary | ICD-10-CM | POA: Diagnosis not present

## 2018-04-06 ENCOUNTER — Telehealth: Payer: Self-pay | Admitting: Internal Medicine

## 2018-04-06 NOTE — Telephone Encounter (Signed)
Numerous attempts to contact patient with recall letters. Unable to reach by telephone. with no success.   ] 07/06/2016 9:16 AM New [10]    Delfino Lovett T [[id number]] 01/26/2017 8:02 AM Scheduled/Linked [30]   [System] 07/19/2017 12:34 AM New [10]   [System] 08/13/2017 11:00 PM Notification Sent [20]   Chanda Busing [[id number]] 12/23/2017 4:31 PM Notification Sent [20]

## 2018-04-20 DIAGNOSIS — K222 Esophageal obstruction: Secondary | ICD-10-CM | POA: Diagnosis not present

## 2018-04-20 DIAGNOSIS — E871 Hypo-osmolality and hyponatremia: Secondary | ICD-10-CM | POA: Diagnosis not present

## 2018-04-20 DIAGNOSIS — Z950 Presence of cardiac pacemaker: Secondary | ICD-10-CM | POA: Diagnosis not present

## 2018-04-20 DIAGNOSIS — K5730 Diverticulosis of large intestine without perforation or abscess without bleeding: Secondary | ICD-10-CM | POA: Diagnosis not present

## 2018-04-20 DIAGNOSIS — E039 Hypothyroidism, unspecified: Secondary | ICD-10-CM | POA: Diagnosis not present

## 2018-04-20 DIAGNOSIS — N183 Chronic kidney disease, stage 3 (moderate): Secondary | ICD-10-CM | POA: Diagnosis not present

## 2018-04-20 DIAGNOSIS — K219 Gastro-esophageal reflux disease without esophagitis: Secondary | ICD-10-CM | POA: Diagnosis not present

## 2018-04-25 DIAGNOSIS — E559 Vitamin D deficiency, unspecified: Secondary | ICD-10-CM | POA: Diagnosis not present

## 2018-04-25 DIAGNOSIS — E039 Hypothyroidism, unspecified: Secondary | ICD-10-CM | POA: Diagnosis not present

## 2018-04-25 DIAGNOSIS — F339 Major depressive disorder, recurrent, unspecified: Secondary | ICD-10-CM | POA: Diagnosis not present

## 2018-04-25 DIAGNOSIS — F0390 Unspecified dementia without behavioral disturbance: Secondary | ICD-10-CM | POA: Diagnosis not present

## 2018-04-25 DIAGNOSIS — E871 Hypo-osmolality and hyponatremia: Secondary | ICD-10-CM | POA: Diagnosis not present

## 2018-04-25 DIAGNOSIS — N39490 Overflow incontinence: Secondary | ICD-10-CM | POA: Diagnosis not present

## 2018-04-25 DIAGNOSIS — F0631 Mood disorder due to known physiological condition with depressive features: Secondary | ICD-10-CM | POA: Diagnosis not present

## 2018-04-25 DIAGNOSIS — H5712 Ocular pain, left eye: Secondary | ICD-10-CM | POA: Diagnosis not present

## 2018-07-24 DIAGNOSIS — K5730 Diverticulosis of large intestine without perforation or abscess without bleeding: Secondary | ICD-10-CM | POA: Diagnosis not present

## 2018-07-24 DIAGNOSIS — Z950 Presence of cardiac pacemaker: Secondary | ICD-10-CM | POA: Diagnosis not present

## 2018-07-24 DIAGNOSIS — K219 Gastro-esophageal reflux disease without esophagitis: Secondary | ICD-10-CM | POA: Diagnosis not present

## 2018-07-24 DIAGNOSIS — R5383 Other fatigue: Secondary | ICD-10-CM | POA: Diagnosis not present

## 2018-07-24 DIAGNOSIS — E039 Hypothyroidism, unspecified: Secondary | ICD-10-CM | POA: Diagnosis not present

## 2018-07-24 DIAGNOSIS — E871 Hypo-osmolality and hyponatremia: Secondary | ICD-10-CM | POA: Diagnosis not present

## 2018-07-24 DIAGNOSIS — N183 Chronic kidney disease, stage 3 (moderate): Secondary | ICD-10-CM | POA: Diagnosis not present

## 2018-07-24 DIAGNOSIS — E559 Vitamin D deficiency, unspecified: Secondary | ICD-10-CM | POA: Diagnosis not present

## 2018-07-27 DIAGNOSIS — K219 Gastro-esophageal reflux disease without esophagitis: Secondary | ICD-10-CM | POA: Diagnosis not present

## 2018-07-27 DIAGNOSIS — F0390 Unspecified dementia without behavioral disturbance: Secondary | ICD-10-CM | POA: Diagnosis not present

## 2018-07-27 DIAGNOSIS — Z6825 Body mass index (BMI) 25.0-25.9, adult: Secondary | ICD-10-CM | POA: Diagnosis not present

## 2018-07-27 DIAGNOSIS — E871 Hypo-osmolality and hyponatremia: Secondary | ICD-10-CM | POA: Diagnosis not present

## 2018-07-27 DIAGNOSIS — F0631 Mood disorder due to known physiological condition with depressive features: Secondary | ICD-10-CM | POA: Diagnosis not present

## 2018-07-27 DIAGNOSIS — N39490 Overflow incontinence: Secondary | ICD-10-CM | POA: Diagnosis not present

## 2018-07-27 DIAGNOSIS — F339 Major depressive disorder, recurrent, unspecified: Secondary | ICD-10-CM | POA: Diagnosis not present

## 2018-07-27 DIAGNOSIS — E039 Hypothyroidism, unspecified: Secondary | ICD-10-CM | POA: Diagnosis not present

## 2018-09-20 DIAGNOSIS — L110 Acquired keratosis follicularis: Secondary | ICD-10-CM | POA: Diagnosis not present

## 2018-09-20 DIAGNOSIS — L609 Nail disorder, unspecified: Secondary | ICD-10-CM | POA: Diagnosis not present

## 2018-09-20 DIAGNOSIS — I739 Peripheral vascular disease, unspecified: Secondary | ICD-10-CM | POA: Diagnosis not present

## 2018-10-18 ENCOUNTER — Telehealth: Payer: Self-pay | Admitting: Cardiology

## 2018-10-18 NOTE — Telephone Encounter (Signed)
Pt has been placed on ABX and is to have 2 or more extractions

## 2018-10-18 NOTE — Telephone Encounter (Signed)
Almyra Free @ Dr. Luan Pulling called wanting to know how long the patient needs to be off his Eliquis before he has dental extractions.  Please call [phone]

## 2018-10-19 NOTE — Telephone Encounter (Signed)
Hold 2 eliquis days prior, restart day after   Zandra Abts MD

## 2018-10-19 NOTE — Telephone Encounter (Signed)
Julie notified and voiced understanding.

## 2018-10-24 DIAGNOSIS — N183 Chronic kidney disease, stage 3 (moderate): Secondary | ICD-10-CM | POA: Diagnosis not present

## 2018-10-24 DIAGNOSIS — E871 Hypo-osmolality and hyponatremia: Secondary | ICD-10-CM | POA: Diagnosis not present

## 2018-10-24 DIAGNOSIS — N189 Chronic kidney disease, unspecified: Secondary | ICD-10-CM | POA: Diagnosis not present

## 2018-10-24 DIAGNOSIS — E039 Hypothyroidism, unspecified: Secondary | ICD-10-CM | POA: Diagnosis not present

## 2018-10-27 DIAGNOSIS — N183 Chronic kidney disease, stage 3 (moderate): Secondary | ICD-10-CM | POA: Diagnosis not present

## 2018-10-27 DIAGNOSIS — F0390 Unspecified dementia without behavioral disturbance: Secondary | ICD-10-CM | POA: Diagnosis not present

## 2018-10-27 DIAGNOSIS — E871 Hypo-osmolality and hyponatremia: Secondary | ICD-10-CM | POA: Diagnosis not present

## 2018-10-27 DIAGNOSIS — F0631 Mood disorder due to known physiological condition with depressive features: Secondary | ICD-10-CM | POA: Diagnosis not present

## 2018-10-27 DIAGNOSIS — I495 Sick sinus syndrome: Secondary | ICD-10-CM | POA: Diagnosis not present

## 2018-10-27 DIAGNOSIS — Z6824 Body mass index (BMI) 24.0-24.9, adult: Secondary | ICD-10-CM | POA: Diagnosis not present

## 2018-10-27 DIAGNOSIS — Z0001 Encounter for general adult medical examination with abnormal findings: Secondary | ICD-10-CM | POA: Diagnosis not present

## 2018-10-27 DIAGNOSIS — E039 Hypothyroidism, unspecified: Secondary | ICD-10-CM | POA: Diagnosis not present

## 2018-11-28 DIAGNOSIS — M79671 Pain in right foot: Secondary | ICD-10-CM | POA: Diagnosis not present

## 2018-11-28 DIAGNOSIS — M778 Other enthesopathies, not elsewhere classified: Secondary | ICD-10-CM | POA: Diagnosis not present

## 2018-12-08 DIAGNOSIS — Z23 Encounter for immunization: Secondary | ICD-10-CM | POA: Diagnosis not present

## 2018-12-11 DIAGNOSIS — I959 Hypotension, unspecified: Secondary | ICD-10-CM | POA: Diagnosis not present

## 2018-12-11 DIAGNOSIS — R531 Weakness: Secondary | ICD-10-CM | POA: Diagnosis not present

## 2018-12-11 DIAGNOSIS — R58 Hemorrhage, not elsewhere classified: Secondary | ICD-10-CM | POA: Diagnosis not present

## 2018-12-13 DIAGNOSIS — I739 Peripheral vascular disease, unspecified: Secondary | ICD-10-CM | POA: Diagnosis not present

## 2018-12-13 DIAGNOSIS — L110 Acquired keratosis follicularis: Secondary | ICD-10-CM | POA: Diagnosis not present

## 2018-12-13 DIAGNOSIS — E785 Hyperlipidemia, unspecified: Secondary | ICD-10-CM | POA: Diagnosis not present

## 2018-12-13 DIAGNOSIS — I10 Essential (primary) hypertension: Secondary | ICD-10-CM | POA: Diagnosis not present

## 2018-12-13 DIAGNOSIS — L609 Nail disorder, unspecified: Secondary | ICD-10-CM | POA: Diagnosis not present

## 2019-01-23 DIAGNOSIS — K219 Gastro-esophageal reflux disease without esophagitis: Secondary | ICD-10-CM | POA: Diagnosis not present

## 2019-01-23 DIAGNOSIS — N189 Chronic kidney disease, unspecified: Secondary | ICD-10-CM | POA: Diagnosis not present

## 2019-01-23 DIAGNOSIS — E039 Hypothyroidism, unspecified: Secondary | ICD-10-CM | POA: Diagnosis not present

## 2019-01-23 DIAGNOSIS — E871 Hypo-osmolality and hyponatremia: Secondary | ICD-10-CM | POA: Diagnosis not present

## 2019-01-26 ENCOUNTER — Other Ambulatory Visit: Payer: Self-pay | Admitting: *Deleted

## 2019-01-26 DIAGNOSIS — Z950 Presence of cardiac pacemaker: Secondary | ICD-10-CM | POA: Diagnosis not present

## 2019-01-26 DIAGNOSIS — Z6824 Body mass index (BMI) 24.0-24.9, adult: Secondary | ICD-10-CM | POA: Diagnosis not present

## 2019-01-26 DIAGNOSIS — N189 Chronic kidney disease, unspecified: Secondary | ICD-10-CM | POA: Diagnosis not present

## 2019-01-26 DIAGNOSIS — F0390 Unspecified dementia without behavioral disturbance: Secondary | ICD-10-CM | POA: Diagnosis not present

## 2019-01-26 DIAGNOSIS — E871 Hypo-osmolality and hyponatremia: Secondary | ICD-10-CM | POA: Diagnosis not present

## 2019-01-26 DIAGNOSIS — F0631 Mood disorder due to known physiological condition with depressive features: Secondary | ICD-10-CM | POA: Diagnosis not present

## 2019-01-26 DIAGNOSIS — D649 Anemia, unspecified: Secondary | ICD-10-CM | POA: Diagnosis not present

## 2019-01-26 DIAGNOSIS — I495 Sick sinus syndrome: Secondary | ICD-10-CM | POA: Diagnosis not present

## 2019-01-26 MED ORDER — APIXABAN 5 MG PO TABS: 5.0000 mg | ORAL_TABLET | Freq: Two times a day (BID) | ORAL | 0 refills | Status: DC

## 2019-02-01 ENCOUNTER — Other Ambulatory Visit: Payer: Self-pay | Admitting: *Deleted

## 2019-02-01 ENCOUNTER — Encounter: Payer: Self-pay | Admitting: *Deleted

## 2019-02-01 DIAGNOSIS — F0280 Dementia in other diseases classified elsewhere without behavioral disturbance: Secondary | ICD-10-CM

## 2019-02-01 NOTE — Patient Outreach (Signed)
Referral received from primary MD for " multiple medical conditions and limited social support"  Pt with diagnoses memory loss than is worsening, hypothyroidism, HTN, hx TIA, PAF, anxiety, GERD, mood disorder with depressive features, poor vision.  Outreach call to patient's spouse Talha Quinley (contact person), HIPAA verified, per Ms. Chrissie Noa, pt "mostly lays around all day"  Does not do much physical activity and cognitive issues can vary day to day.  Spouse states she does not have any long term plans and is able to keep pt at home at present with assistance of one of her granddaughters (but limited assistance), her adult son (only child) passed away.  Spouse is reluctant to receive assistance from social worker but is agreeable to a phone call.  RN CM faxed today's note and barrier letter to primary MD.  Outpatient Encounter Medications as of 02/01/2019  Medication Sig Note  . acetaminophen (TYLENOL) 325 MG tablet Take 650 mg by mouth every 6 (six) hours as needed for mild pain or headache.   . ALPRAZolam (XANAX) 0.25 MG tablet Take 1 tablet by mouth daily as needed. 10/30/2017: Lf: 03/2016 [facility]  . apixaban (ELIQUIS) 5 MG TABS tablet Take 1 tablet (5 mg total) by mouth 2 (two) times daily.   Marland Kitchen levothyroxine (SYNTHROID, LEVOTHROID) 75 MCG tablet Take 1 tablet by mouth daily. 10/30/2017: Lf: 10/17/17 [facility]  . memantine (NAMENDA) 10 MG tablet Take 1 tablet by mouth 2 (two) times daily. 10/30/2017: Lf: 10/17/17 [facility]  . metoprolol tartrate (LOPRESSOR) 25 MG tablet Take 0.5 tablets (12.5 mg total) 2 (two) times daily by mouth. (Patient taking differently: Take 25 mg by mouth 2 (two) times daily. ) 10/30/2017: Lf: 04/2017 [facility]: Written as 12.5 mg BID  . Multiple Vitamins-Minerals (OCUVITE PRESERVISION) TABS Take 1 tablet by mouth 2 (two) times daily.     . pantoprazole (PROTONIX) 40 MG tablet Take 1 tablet by mouth daily.   . simvastatin (ZOCOR) 20 MG tablet Take 20 mg by mouth at bedtime.    10/30/2017: Lf: 10/17/17 [facility]  . tamsulosin (FLOMAX) 0.4 MG CAPS capsule Take 0.4 mg by mouth 2 (two) times daily.  10/30/2017: Lf: 09/24/17 [facility]  . IRON PO Take 1 tablet by mouth once a week. 10/30/2017: Caused GI disturbance   No facility-administered encounter medications on file as of 02/01/2019.     TH[facility] CM Care Plan Problem One     Most Recent Value  Care Plan Problem One  Knowledge deficit related to cognitive issues/ memory loss  Role Documenting the Problem One  Care Management Louann for Problem One  Active  THN Long Term Goal   Caregiver will verbalize long term plan for spouse(pt) with cognitive / memory issues within 60 days  THN Long Term Goal Start Date  02/01/19  Interventions for Problem One Long Term Goal  RN CM established plan of care with patient's spouse, reviewed medications with spouse, mailed successful outreach letter to pt home including consent form, 24 hour nurse line magnet, pamphlet.  [facility] CM Short Term Goal #1   Caregiver/ spouse will talk with social worker about resources/ long term plans within 30 days  THN CM Short Term Goal #1 Start Date  02/01/19  Interventions for Short Term Goal #1  RN CM placed order for [facility] social worker for resources/ additional support for spouse caring for pt with memory loss  THN CM Short Term Goal #2   Caregiver will assist pt to maintain calm environment  and manage anxiety within 30 days  THN CM Short Term Goal #2 Start Date  02/01/19  Interventions for Short Term Goal #2  RN CM ask spouse to assist pt in taking all medications as prescribed, talk to pt in calm voice, speak with primary MD about any concerns      PLAN Outreach pt next month for telephone assessment  Jacqlyn Larsen [facility], [place] Coordinator [phone]

## 2019-02-01 NOTE — Addendum Note (Signed)
Addended by: Jacqlyn Larsen A on: 02/01/2019 07:07 PM   Modules accepted: Orders

## 2019-02-02 ENCOUNTER — Other Ambulatory Visit: Payer: Self-pay

## 2019-02-02 NOTE — Patient Outreach (Signed)
[facility]) Care Management  02/02/2019  JESUSANTONIO FILARDI Sep 04, 1923 [id number]  Social work referral from MD states " multiple medical issues and limited social support" Referral also sent for pts wife (please assign to same Education officer, museum) Pt unsure of long term plan and states she doesn't think she can afford assistance in the home if it comes to this." Successful outreach to patient's spouse today. Spouse reports that patient is independent with ADL's and does not think that he would be open to in-home aide assistance.  We discussed that aide services are only covered by Medicare while in conjunction with skilled service.  Talked with spouse about Medicaid eligibility and application process.  Based on information provided, spouse stated that they would not qualify for Medicaid.  Spouse also stated they cannot afford the cost for private pay aide services.   Spouse reports having two granddaughters and stated "If I call them, they will come."  Per spouse, they assist with obtaining groceries, medications, etc. Talked with spouse about long term care options.  Spouse stated the following:  "I am not ready to go anywhere and I want to keep him here as long as we can.  I think he is able to stay at home right now and I think I will know when the time comes for him to go somewhere." Spouse was appreciative of call but stated they are not interested in pursuing placement options at this time and, unfortunately, cannot afford the cost of private aide services.  Closing social work case but did encourage spouse to call if additional needs arise.    Ronn Melena, BSW Social Worker [phone]

## 2019-02-27 ENCOUNTER — Other Ambulatory Visit: Payer: Self-pay | Admitting: *Deleted

## 2019-02-27 ENCOUNTER — Encounter: Payer: Self-pay | Admitting: *Deleted

## 2019-02-27 NOTE — Patient Outreach (Signed)
Outreach call to patient's wife for telephone assessment, HIPAA verified, Ms. Reiland reports pt is doing "about the same"  Has all medications and taking as prescribed and spouse continues to provide oversight for this, continues taking medication for anxiety.  pt is still not getting up and walking much.  Wife reports she did speak with [facility] social worker but is not making any different decisions at this time.  THN CM Care Plan Problem One     Most Recent Value  Care Plan Problem One  Knowledge deficit related to cognitive issues/ memory loss  Role Documenting the Problem One  Care Management [place] for Problem One  Active  THN Long Term Goal   Caregiver will verbalize long term plan for spouse(pt) with cognitive / memory issues within 60 days  THN Long Term Goal Start Date  02/01/19  Interventions for Problem One Long Term Goal  RN CM reinforced available resources for pt, CG and importance of working with [facility] CSW on long term planning  THN CM Short Term Goal #1   Caregiver/ spouse will talk with Education officer, museum about resources/ long term plans within 30 days  THN CM Short Term Goal #1 Start Date  02/01/19  [facility] CM Short Term Goal #1 Met Date  02/27/19  Interventions for Short Term Goal #1  [facility] BSW contacted pt and provided resources for long term care planning  THN CM Short Term Goal #2   Caregiver will assist pt to maintain calm environment and manage anxiety within 30 days  THN CM Short Term Goal #2 Start Date  02/01/19  [facility] CM Short Term Goal #2 Met Date  02/27/19  Interventions for Short Term Goal #2  Pt is taking all medications as prescribed per spouse,  RN CM reminded spouse to discuss any health concerns with primary MD      PLAN Outreach pt, CG for telephone assessment next month  Jacqlyn Larsen [facility], [place] Coordinator [phone]

## 2019-03-12 ENCOUNTER — Other Ambulatory Visit: Payer: Self-pay | Admitting: *Deleted

## 2019-03-12 MED ORDER — APIXABAN 5 MG PO TABS: 5.0000 mg | ORAL_TABLET | Freq: Two times a day (BID) | ORAL | 0 refills | Status: DC

## 2019-03-27 ENCOUNTER — Other Ambulatory Visit: Payer: Self-pay | Admitting: *Deleted

## 2019-03-27 ENCOUNTER — Encounter: Payer: Self-pay | Admitting: *Deleted

## 2019-03-27 NOTE — Patient Outreach (Signed)
Outreach call to patient's spouse for telephone assessment, spoke with spouse Reynold Mantell, HIPAA verified, Mrs. Gallardo states "he's about the same, no change"  Pt has all medications and taking as prescribed, no falls reported, spouse states she nor spouse qualify for medicaid and are not planning to make any changes at present, will continue receiving assistance from granddaughters and other family members and spouse plans to keep pt at home with her for as long as she can, spouse states she does not have "extra money to pay out of pocket for care"  RN CM closed case today as spouse/ pt not ready to consider other alternatives at present for long term care,  faxed case closure letter to primary MD.  RN CM reminded spouse to be mindful of pandemic precautions such as wearing a mask, handwashing and social distancing, informed covid-19 vaccine is now available for those over age 65 at local health department, spouse verbalizes understanding.  THN CM Care Plan Problem One     Most Recent Value  Care Plan Problem One  Knowledge deficit related to cognitive issues/ memory loss  Role Documenting the Problem One  Care Management [place] for Problem One  Active  THN Long Term Goal   Caregiver will verbalize long term plan for spouse(pt) with cognitive / memory issues within 60 days  THN Long Term Goal Start Date  02/01/19  [facility] Long Term Goal Met Date  03/27/19  Interventions for Problem One Long Term Goal  RN CM reviewed discharge plan with patient's wife and will close case today, CG not ready to make any changes related to pt care and will continue keeping pt at home  [facility] CM Short Term Goal #1   Caregiver/ spouse will talk with social worker about resources/ long term plans within 30 days  THN CM Short Term Goal #1 Start Date  02/01/19  [facility] CM Short Term Goal #1 Met Date  02/27/19  THN CM Short Term Goal #2   Caregiver will assist pt to maintain calm environment and manage anxiety within  30 days  THN CM Short Term Goal #2 Start Date  02/01/19  [facility] CM Short Term Goal #2 Met Date  02/27/19      Close case  Jacqlyn Larsen [facility], [place] Coordinator [phone]

## 2019-04-03 ENCOUNTER — Telehealth: Payer: Self-pay | Admitting: Cardiology

## 2019-04-03 MED ORDER — APIXABAN 5 MG PO TABS: 5.0000 mg | ORAL_TABLET | Freq: Two times a day (BID) | ORAL | 6 refills | Status: DC

## 2019-04-03 NOTE — Telephone Encounter (Signed)
*  STAT* If patient is at the pharmacy, call can be transferred to refill team.   1. Which medications need to be refilled? ELIQUIS   2. Which pharmacy/location (including street and city if local pharmacy) is medication to be sent to? [facility] in [place]  3. Do they need a 30 day or 90 day supply?

## 2019-04-03 NOTE — Telephone Encounter (Signed)
Done

## 2019-04-10 ENCOUNTER — Telehealth (INDEPENDENT_AMBULATORY_CARE_PROVIDER_SITE_OTHER): Payer: MEDICARE | Admitting: Cardiology

## 2019-04-10 ENCOUNTER — Encounter: Payer: Self-pay | Admitting: Cardiology

## 2019-04-10 VITALS — Ht 72.0 in | Wt 168.0 lb

## 2019-04-10 DIAGNOSIS — I495 Sick sinus syndrome: Secondary | ICD-10-CM

## 2019-04-10 DIAGNOSIS — I4891 Unspecified atrial fibrillation: Secondary | ICD-10-CM

## 2019-04-10 MED ORDER — HYDRALAZINE HCL 25 MG PO TABS: 25.0000 mg | ORAL_TABLET | Freq: Two times a day (BID) | ORAL | 1 refills | Status: DC

## 2019-04-10 MED ORDER — ATORVASTATIN CALCIUM 40 MG PO TABS: 40.0000 mg | ORAL_TABLET | Freq: Every day | ORAL | 1 refills | Status: DC

## 2019-04-10 NOTE — Patient Instructions (Addendum)
Your physician wants you to follow-up in: [place] will receive a reminder letter in the mail two months in advance. If you don't receive a letter, please call our office to schedule the follow-up appointment.  Your physician recommends that you continue on your current medications as directed. Please refer to the Current Medication list given to you today.  WE WILL GET YOU AN APPOINTMENT WITH DEVICE CLINIC  Thank you for choosing [facility]!!

## 2019-04-10 NOTE — Progress Notes (Signed)
Virtual Visit via Telephone Note   This visit type was conducted due to national recommendations for restrictions regarding the COVID-19 Pandemic (e.g. social distancing) in an effort to limit this patient's exposure and mitigate transmission in our community.  Due to his co-morbid illnesses, this patient is at least at moderate risk for complications without adequate follow up.  This format is felt to be most appropriate for this patient at this time.  The patient did not have access to video technology/had technical difficulties with video requiring transitioning to audio format only (telephone).  All issues noted in this document were discussed and addressed.  No physical exam could be performed with this format.  Please refer to the patient's chart for his  consent to telehealth for [facility].   Date:  04/10/2019   ID:  Wesley Dyer, DOB [birth year], MRN [id number]  Patient Location: Home Provider Location: Office  PCP:  Curlene Labrum, MD  Cardiologist:  Carlyle Dolly, MD Electrophysiologist:  Thompson Grayer, MD   Evaluation Performed:  Follow-Up Visit  Chief Complaint:  Follow up visit  History of Present Illness:    Wesley Dyer is a 86 y.o. male seen today for follow up of the following medical problems.   History per wife due to patient poor hearing.    1. Afib - last visit he had some elevated heart rates on recent device check. He reported occasional palpitations, fatigue. We started lopressor 12.3m bid.   - no recent palpitations - compliant with meds  2. Tachy-brady syndrome - pacemaker followed by Dr ARayann Heman  - has not had device checked in some time  3.Hypothyroidism - followed by pcp      The patient does not have symptoms concerning for COVID-19 infection (fever, chills, cough, or new shortness of breath).    Past Medical History:  Diagnosis Date  . Chest pain    unspecified  . Chest pain, atypical   . Depression   .  Depression   . Drug therapy    Coumadin Therapy  . Gastroesophageal reflux disease   . Gastroesophageal reflux disease   . Hypothyroidism   . Paroxysmal atrial fibrillation ([facility] May 2011  . [place]    7 second pauses documented  . Transient ischemic attack    high risk for thromboembolic disease, secondary to atrial fibrillation, patient declines Coumadin due to prior history of severe epistaxis. Patient not willing to restart Coumadin therapy   Past Surgical History:  Procedure Laterality Date  . ABDOMINAL SURGERY     Abdominal diverticultis  . ENDOSCOPIC RETROGRADE CHOLANGIOPANCREATOGRAPHY (ERCP) WITH PROPOFOL N/A 11/01/2017   Procedure: ENDOSCOPIC RETROGRADE CHOLANGIOPANCREATOGRAPHY (ERCP) WITH PROPOFOL;  Surgeon: MRush LandmarkGTelford Nab, MD;  [place] [facility]  Service: Gastroenterology;  Laterality: N/A;  . PACEMAKER INSERTION  07/06/12   MDT Adapta L implanted by Dr KCaryl Comes . PERMANENT PACEMAKER INSERTION N/A 07/06/2012   Procedure: PERMANENT PACEMAKER INSERTION;  Surgeon: SDeboraha Sprang MD;  [facility];  Service: Cardiovascular;  Laterality: N/A;  . SUBMUCOSAL INJECTION  11/01/2017   Procedure: SUBMUCOSAL INJECTION;  Surgeon: MIrving Copas, MD;  [facility];  Service: Gastroenterology;;     No outpatient medications have been marked as taking for the 04/10/19 encounter (Appointment) with BArnoldo Lenis MD.     Allergies:   Warfarin sodium   Social History   Tobacco Use  . Smoking status: Former Smoker    Packs/day: 0.50    Years: 10.00  Pack years: 5.00    Types: Cigarettes    Start date: 07/16/1931    Quit date: 07/15/1941    Years since quitting: 77.7  . Smokeless tobacco: Former Systems developer    Types: Chew    Quit date: 03/15/2005  Substance Use Topics  . Alcohol use: No    Alcohol/week: 0.0 standard drinks  . Drug use: No     Family Hx: The patient's family history includes Coronary artery disease  in an other family member; Hyperlipidemia in an other family member.  ROS:   Please see the history of present illness.     All other systems reviewed and are negative.   Prior CV studies:   The following studies were reviewed today:  07/2013 Echo Study Conclusions  - Left ventricle: The cavity size was normal. Wall thickness was increased in a pattern of mild LVH. Systolic function was normal. The estimated ejection fraction was in the range of 55% to 60%. Wall motion was normal; there were no regional wall motion abnormalities. Doppler parameters are consistent with abnormal left ventricular relaxation (grade 1 diastolic dysfunction). - Aortic valve: Trileaflet; mildly thickened, mildly calcified leaflets. Trivial regurgitation. - Mitral valve: Calcified annulus. Mildly thickened leaflets . Trivial regurgitation. - Left atrium: The atrium was at the upper limits of normal in size. - Right ventricle: The cavity size was mildly dilated. Pacer wire or catheter noted in right ventricle. - Right atrium: Central venous pressure: 73m Hg (est). - Tricuspid valve: Mild regurgitation. - Pulmonary arteries: PA peak pressure: 331mHg (S). - Pericardium, extracardiac: There was no pericardial effusion. Impressions:  - Mild LVH with LVEF 5556-25%grade 1 diastolic dysfunction. Upper normal left atrial chamber size. MAC with trivial mitral regurgitation. Mildly sclerotic aortic valve with trivial aortic regurgitation. Mild RV enlargement, device wire noted. Mild tricuspid regurgitation with PASP 30 mm mercury.  Labs/Other Tests and Data Reviewed:    EKG:  No ECG reviewed.  Recent Labs: No results found for requested labs within last 8760 hours.   Recent Lipid Panel Lab Results  Component Value Date/Time   TRIG 31 10/30/2017 01:10 AM    Wt Readings from Last 3 Encounters:  02/01/19 175 lb (79.4 kg)  11/03/17 173 lb (78.5 kg)  07/06/16 180 lb  (81.6 kg)     Objective:    Vital Signs:   Today's Vitals   04/10/19 0904  Weight: 168 lb (76.2 kg)  Height: 6' (1.829 m)   Body mass index is 22.78 kg/m. Unable to assess, history is per wife due to patient's poor hearing.   ASSESSMENT & PLAN:    1. Afib - no recent symptoms - continue current meds  2. Tachy-brady syndrome - overdue for device check, we will have device clinic contact patient.      F/u 6 months  COVID-19 Education: The signs and symptoms of COVID-19 were discussed with the patient and how to seek care for testing (follow up with PCP or arrange E-visit).  The importance of social distancing was discussed today.  Time:   Today, I have spent 14 minutes with the patient with telehealth technology discussing the above problems.     Medication Adjustments/Labs and Tests Ordered: Current medicines are reviewed at length with the patient today.  Concerns regarding medicines are outlined above.   Tests Ordered: No orders of the defined types were placed in this encounter.   Medication Changes: No orders of the defined types were placed in this encounter.   Follow Up:  Either In Person or Virtual in 6 month(s)  Signed, Carlyle Dolly, MD  04/10/2019 8:28 AM    [place]

## 2019-04-13 DIAGNOSIS — N183 Chronic kidney disease, stage 3 unspecified: Secondary | ICD-10-CM | POA: Diagnosis not present

## 2019-04-13 DIAGNOSIS — K219 Gastro-esophageal reflux disease without esophagitis: Secondary | ICD-10-CM | POA: Diagnosis not present

## 2019-04-27 DIAGNOSIS — Z23 Encounter for immunization: Secondary | ICD-10-CM | POA: Diagnosis not present

## 2019-04-30 DIAGNOSIS — F0631 Mood disorder due to known physiological condition with depressive features: Secondary | ICD-10-CM | POA: Diagnosis not present

## 2019-04-30 DIAGNOSIS — Z6824 Body mass index (BMI) 24.0-24.9, adult: Secondary | ICD-10-CM | POA: Diagnosis not present

## 2019-04-30 DIAGNOSIS — F0390 Unspecified dementia without behavioral disturbance: Secondary | ICD-10-CM | POA: Diagnosis not present

## 2019-04-30 DIAGNOSIS — Z950 Presence of cardiac pacemaker: Secondary | ICD-10-CM | POA: Diagnosis not present

## 2019-04-30 DIAGNOSIS — F339 Major depressive disorder, recurrent, unspecified: Secondary | ICD-10-CM | POA: Diagnosis not present

## 2019-04-30 DIAGNOSIS — F419 Anxiety disorder, unspecified: Secondary | ICD-10-CM | POA: Diagnosis not present

## 2019-04-30 DIAGNOSIS — I495 Sick sinus syndrome: Secondary | ICD-10-CM | POA: Diagnosis not present

## 2019-05-25 DIAGNOSIS — Z23 Encounter for immunization: Secondary | ICD-10-CM | POA: Diagnosis not present

## 2019-06-13 DIAGNOSIS — E039 Hypothyroidism, unspecified: Secondary | ICD-10-CM | POA: Diagnosis not present

## 2019-06-13 DIAGNOSIS — K219 Gastro-esophageal reflux disease without esophagitis: Secondary | ICD-10-CM | POA: Diagnosis not present

## 2019-06-19 IMAGING — NM NM HEPATOBILIARY IMAGE, INC GB
2 series · 12 of 12 positions shown · non-contrast
Comparison: Right upper quadrant ultrasound 10/31/2017 and
10/30/2017

CLINICAL DATA: Right upper quadrant abdominal pain, cholecystitis
suspected.

EXAM:
NUCLEAR MEDICINE HEPATOBILIARY IMAGING
TECHNIQUE: Sequential images of the abdomen were obtained [DATE] minutes
following intravenous administration of radiopharmaceutical.
RADIOPHARMACEUTICALS:  5.4 mCi 6c-DDm  Choletec IV

[he hepatobiliary · 4.52mm/px · 6 of 30 frames shown (1 of 2)]
[frame 3/30]
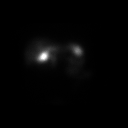
[frame 8/30]
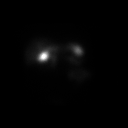
[frame 13/30]
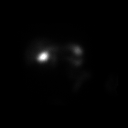
[frame 18/30]
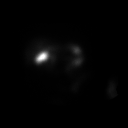
[frame 23/30]
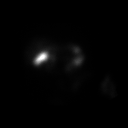
[frame 28/30]
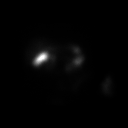

[he hepatobiliary · 4.52mm/px · 6 of 60 frames shown (2 of 2)]
[frame 6/60]
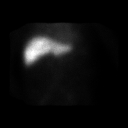
[frame 16/60]
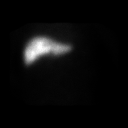
[frame 26/60]
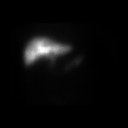
[frame 36/60]
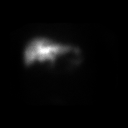
[frame 46/60]
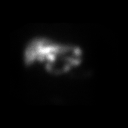
[frame 56/60]
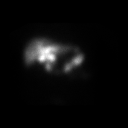

[12 of 12 positions shown; findings below may reference images not displayed]

FINDINGS: Liver uptake is normal. There is excretion into the small bowel by
20 minutes. The gallbladder is visualized by 30 minutes.

There was some question during the exam of gallbladder
visualization. Therefore 3 mg morphine was then injected.

Following morphine, gallbladder uptake is exaggerated..
IMPRESSION: Patent cystic duct.  No evidence for cholecystitis.

## 2019-07-20 ENCOUNTER — Other Ambulatory Visit: Payer: Self-pay | Admitting: Cardiology

## 2019-07-20 DIAGNOSIS — E875 Hyperkalemia: Secondary | ICD-10-CM | POA: Diagnosis not present

## 2019-07-20 DIAGNOSIS — N183 Chronic kidney disease, stage 3 unspecified: Secondary | ICD-10-CM | POA: Diagnosis not present

## 2019-07-20 DIAGNOSIS — K219 Gastro-esophageal reflux disease without esophagitis: Secondary | ICD-10-CM | POA: Diagnosis not present

## 2019-07-20 DIAGNOSIS — D519 Vitamin B12 deficiency anemia, unspecified: Secondary | ICD-10-CM | POA: Diagnosis not present

## 2019-07-20 DIAGNOSIS — E039 Hypothyroidism, unspecified: Secondary | ICD-10-CM | POA: Diagnosis not present

## 2019-07-20 DIAGNOSIS — E559 Vitamin D deficiency, unspecified: Secondary | ICD-10-CM | POA: Diagnosis not present

## 2019-07-20 DIAGNOSIS — R5383 Other fatigue: Secondary | ICD-10-CM | POA: Diagnosis not present

## 2019-07-24 DIAGNOSIS — Z950 Presence of cardiac pacemaker: Secondary | ICD-10-CM | POA: Diagnosis not present

## 2019-07-24 DIAGNOSIS — F0631 Mood disorder due to known physiological condition with depressive features: Secondary | ICD-10-CM | POA: Diagnosis not present

## 2019-07-24 DIAGNOSIS — F339 Major depressive disorder, recurrent, unspecified: Secondary | ICD-10-CM | POA: Diagnosis not present

## 2019-07-24 DIAGNOSIS — E875 Hyperkalemia: Secondary | ICD-10-CM | POA: Diagnosis not present

## 2019-07-24 DIAGNOSIS — F0390 Unspecified dementia without behavioral disturbance: Secondary | ICD-10-CM | POA: Diagnosis not present

## 2019-07-24 DIAGNOSIS — E039 Hypothyroidism, unspecified: Secondary | ICD-10-CM | POA: Diagnosis not present

## 2019-07-24 DIAGNOSIS — E559 Vitamin D deficiency, unspecified: Secondary | ICD-10-CM | POA: Diagnosis not present

## 2019-07-25 DIAGNOSIS — L110 Acquired keratosis follicularis: Secondary | ICD-10-CM | POA: Diagnosis not present

## 2019-07-25 DIAGNOSIS — L609 Nail disorder, unspecified: Secondary | ICD-10-CM | POA: Diagnosis not present

## 2019-07-25 DIAGNOSIS — I739 Peripheral vascular disease, unspecified: Secondary | ICD-10-CM | POA: Diagnosis not present

## 2019-10-10 DIAGNOSIS — L110 Acquired keratosis follicularis: Secondary | ICD-10-CM | POA: Diagnosis not present

## 2019-10-10 DIAGNOSIS — I739 Peripheral vascular disease, unspecified: Secondary | ICD-10-CM | POA: Diagnosis not present

## 2019-10-10 DIAGNOSIS — L609 Nail disorder, unspecified: Secondary | ICD-10-CM | POA: Diagnosis not present

## 2019-10-11 ENCOUNTER — Encounter: Payer: Self-pay | Admitting: *Deleted

## 2019-10-11 ENCOUNTER — Ambulatory Visit (INDEPENDENT_AMBULATORY_CARE_PROVIDER_SITE_OTHER): Payer: MEDICARE | Admitting: Cardiology

## 2019-10-11 ENCOUNTER — Ambulatory Visit: Payer: MEDICARE | Admitting: Cardiology

## 2019-10-11 ENCOUNTER — Encounter: Payer: Self-pay | Admitting: Cardiology

## 2019-10-11 VITALS — BP 114/62 | HR 73 | Ht 72.0 in | Wt 164.4 lb

## 2019-10-11 DIAGNOSIS — I495 Sick sinus syndrome: Secondary | ICD-10-CM | POA: Diagnosis not present

## 2019-10-11 DIAGNOSIS — I4891 Unspecified atrial fibrillation: Secondary | ICD-10-CM | POA: Diagnosis not present

## 2019-10-11 NOTE — Progress Notes (Signed)
Clinical Summary Wesley Dyer is a 86 y.o.male seen today for follow up of the following medical problems.      1. Afib - last visit he had some elevated heart rates on recent device check. He reported occasional palpitations, fatigue. We started lopressor 12.80m bid.   - no recent palpitatinos - family does not recall if he is taking lorpessor or not, had started previously for some high heart rates on device check   2. Tachy-brady syndrome - pacemaker followed by Dr ARayann Heman   - overdue for device check    3.Hypothyroidism - followed by pcp     Past Medical History:  Diagnosis Date  . Chest pain    unspecified  . Chest pain, atypical   . Depression   . Depression   . Drug therapy    Coumadin Therapy  . Gastroesophageal reflux disease   . Gastroesophageal reflux disease   . Hypothyroidism   . Paroxysmal atrial fibrillation ([facility] May 2011  . [facility]    7 second pauses documented  . Transient ischemic attack    high risk for thromboembolic disease, secondary to atrial fibrillation, patient declines Coumadin due to prior history of severe epistaxis. Patient not willing to restart Coumadin therapy     Allergies  Allergen Reactions  . Warfarin Sodium Other (See Comments)    REACTION: bleeding     Current Outpatient Medications  Medication Sig Dispense Refill  . acetaminophen (TYLENOL) 325 MG tablet Take 650 mg by mouth every 6 (six) hours as needed for mild pain or headache.    . ALPRAZolam (XANAX) 0.25 MG tablet Take 1 tablet by mouth daily as needed.    .Marland KitchenELIQUIS 5 MG TABS tablet TAKE 1 TABLET(5 MG) BY MOUTH TWICE DAILY 60 tablet 6  . levothyroxine (SYNTHROID, LEVOTHROID) 75 MCG tablet Take 1 tablet by mouth daily.    . memantine (NAMENDA) 10 MG tablet Take 1 tablet by mouth 2 (two) times daily.  0  . metoprolol tartrate (LOPRESSOR) 25 MG tablet Take 0.5 tablets (12.5 mg total) 2 (two) times daily by mouth.  (Patient taking differently: Take 25 mg by mouth 2 (two) times daily. ) 15 tablet 0  . Multiple Vitamins-Minerals (OCUVITE PRESERVISION) TABS Take 1 tablet by mouth 2 (two) times daily.      . pantoprazole (PROTONIX) 40 MG tablet Take 1 tablet by mouth daily.    . simvastatin (ZOCOR) 20 MG tablet Take 20 mg by mouth at bedtime.      . tamsulosin (FLOMAX) 0.4 MG CAPS capsule Take 0.4 mg by mouth 2 (two) times daily.      No current facility-administered medications for this visit.     Past Surgical History:  Procedure Laterality Date  . ABDOMINAL SURGERY     Abdominal diverticultis  . ENDOSCOPIC RETROGRADE CHOLANGIOPANCREATOGRAPHY (ERCP) WITH PROPOFOL N/A 11/01/2017   Procedure: ENDOSCOPIC RETROGRADE CHOLANGIOPANCREATOGRAPHY (ERCP) WITH PROPOFOL;  Surgeon: MRush LandmarkGTelford Nab, MD;  [place] [facility]  Service: Gastroenterology;  Laterality: N/A;  . PACEMAKER INSERTION  07/06/12   MDT Adapta L implanted by Dr KCaryl Comes . PERMANENT PACEMAKER INSERTION N/A 07/06/2012   Procedure: PERMANENT PACEMAKER INSERTION;  Surgeon: SDeboraha Sprang MD;  [facility];  Service: Cardiovascular;  Laterality: N/A;  . SUBMUCOSAL INJECTION  11/01/2017   Procedure: SUBMUCOSAL INJECTION;  Surgeon: MRush LandmarkGTelford Nab, MD;  [facility];  Service: Gastroenterology;;     Allergies  Allergen Reactions  . Warfarin Sodium Other (  See Comments)    REACTION: bleeding      Family History  Problem Relation Age of Onset  . Coronary artery disease Other   . Hyperlipidemia Other      Social History Mr. Costlow reports that he quit smoking about 78 years ago. His smoking use included cigarettes. He started smoking about 88 years ago. He has a 5.00 pack-year smoking history. He quit smokeless tobacco use about 14 years ago.  His smokeless tobacco use included chew. Mr. Nill reports no history of alcohol use.   Review of Systems CONSTITUTIONAL: No weight loss, fever, chills, weakness  or fatigue.  HEENT: Eyes: No visual loss, blurred vision, double vision or yellow sclerae.No hearing loss, sneezing, congestion, runny nose or sore throat.  SKIN: No rash or itching.  CARDIOVASCULAR: per hpi RESPIRATORY: No shortness of breath, cough or sputum.  GASTROINTESTINAL: No anorexia, nausea, vomiting or diarrhea. No abdominal pain or blood.  GENITOURINARY: No burning on urination, no polyuria NEUROLOGICAL: No headache, dizziness, syncope, paralysis, ataxia, numbness or tingling in the extremities. No change in bowel or bladder control.  MUSCULOSKELETAL: No muscle, back pain, joint pain or stiffness.  LYMPHATICS: No enlarged nodes. No history of splenectomy.  PSYCHIATRIC: No history of depression or anxiety.  ENDOCRINOLOGIC: No reports of sweating, cold or heat intolerance. No polyuria or polydipsia.  Marland Kitchen   Physical Examination Today's Vitals   10/11/19 0958  BP: (!) 114/62  Pulse: 73  SpO2: 97%  Weight: 164 lb 6.4 oz (74.6 kg)  Height: 6' (1.829 m)   Body mass index is 22.3 kg/m.  Gen: resting comfortably, no acute distress HEENT: no scleral icterus, pupils equal round and reactive, no palptable cervical adenopathy,  CV: RRR, no m/r/g, no jvd Resp: Clear to auscultation bilaterally GI: abdomen is soft, non-tender, non-distended, normal bowel sounds, no hepatosplenomegaly MSK: extremities are warm, no edema.  Skin: warm, no rash Neuro:  no focal deficits Psych: appropriate affect   Diagnostic Studies 07/2013 Echo Study Conclusions  - Left ventricle: The cavity size was normal. Wall thickness was increased in a pattern of mild LVH. Systolic function was normal. The estimated ejection fraction was in the range of 55% to 60%. Wall motion was normal; there were no regional wall motion abnormalities. Doppler parameters are consistent with abnormal left ventricular relaxation (grade 1 diastolic dysfunction). - Aortic valve: Trileaflet; mildly thickened,  mildly calcified leaflets. Trivial regurgitation. - Mitral valve: Calcified annulus. Mildly thickened leaflets . Trivial regurgitation. - Left atrium: The atrium was at the upper limits of normal in size. - Right ventricle: The cavity size was mildly dilated. Pacer wire or catheter noted in right ventricle. - Right atrium: Central venous pressure: 21m Hg (est). - Tricuspid valve: Mild regurgitation. - Pulmonary arteries: PA peak pressure: 362mHg (S). - Pericardium, extracardiac: There was no pericardial effusion. Impressions:  - Mild LVH with LVEF 5597-67%grade 1 diastolic dysfunction. Upper normal left atrial chamber size. MAC with trivial mitral regurgitation. Mildly sclerotic aortic valve with trivial aortic regurgitation. Mild RV enlargement, device wire noted. Mild tricuspid regurgitation with PASP 30 mm mercury.    Assessment and Plan  1. Afib -no symptoms - appears he is not taking the lopressor that was previously prescribed - denies symptoms, EKG today shows a paced v sensed - follow up device check, if heart rate issues would restart his lopressor 12.18m57mid   2. Tachy-brady syndrome - overdue for device check - will look to arrange device clinic appt   F/u  6 months   Arnoldo Lenis, M.D.

## 2019-10-11 NOTE — Patient Instructions (Signed)
Medication Instructions:  Continue all current medications.  Labwork: none  Testing/Procedures: none  Follow-Up: 6 months   Any Other Special Instructions Will Be Listed Below (If Applicable). Schedule device clinic follow up.    If you need a refill on your cardiac medications before your next appointment, please call your pharmacy.

## 2019-10-11 NOTE — Addendum Note (Signed)
Addended by: Laurine Blazer on: 10/11/2019 01:46 PM   Modules accepted: Orders

## 2019-10-12 DIAGNOSIS — K219 Gastro-esophageal reflux disease without esophagitis: Secondary | ICD-10-CM | POA: Diagnosis not present

## 2019-10-12 DIAGNOSIS — E039 Hypothyroidism, unspecified: Secondary | ICD-10-CM | POA: Diagnosis not present

## 2019-10-12 DIAGNOSIS — N183 Chronic kidney disease, stage 3 unspecified: Secondary | ICD-10-CM | POA: Diagnosis not present

## 2019-10-12 DIAGNOSIS — I482 Chronic atrial fibrillation, unspecified: Secondary | ICD-10-CM | POA: Diagnosis not present

## 2019-10-18 DIAGNOSIS — N189 Chronic kidney disease, unspecified: Secondary | ICD-10-CM | POA: Diagnosis not present

## 2019-10-18 DIAGNOSIS — N183 Chronic kidney disease, stage 3 unspecified: Secondary | ICD-10-CM | POA: Diagnosis not present

## 2019-10-18 DIAGNOSIS — K219 Gastro-esophageal reflux disease without esophagitis: Secondary | ICD-10-CM | POA: Diagnosis not present

## 2019-10-18 DIAGNOSIS — E559 Vitamin D deficiency, unspecified: Secondary | ICD-10-CM | POA: Diagnosis not present

## 2019-10-19 ENCOUNTER — Encounter: Payer: MEDICARE | Admitting: Internal Medicine

## 2019-10-22 DIAGNOSIS — F339 Major depressive disorder, recurrent, unspecified: Secondary | ICD-10-CM | POA: Diagnosis not present

## 2019-10-22 DIAGNOSIS — Z950 Presence of cardiac pacemaker: Secondary | ICD-10-CM | POA: Diagnosis not present

## 2019-10-22 DIAGNOSIS — F0631 Mood disorder due to known physiological condition with depressive features: Secondary | ICD-10-CM | POA: Diagnosis not present

## 2019-10-22 DIAGNOSIS — E875 Hyperkalemia: Secondary | ICD-10-CM | POA: Diagnosis not present

## 2019-10-22 DIAGNOSIS — F419 Anxiety disorder, unspecified: Secondary | ICD-10-CM | POA: Diagnosis not present

## 2019-10-22 DIAGNOSIS — F0390 Unspecified dementia without behavioral disturbance: Secondary | ICD-10-CM | POA: Diagnosis not present

## 2019-10-22 DIAGNOSIS — I495 Sick sinus syndrome: Secondary | ICD-10-CM | POA: Diagnosis not present

## 2019-11-13 DIAGNOSIS — N183 Chronic kidney disease, stage 3 unspecified: Secondary | ICD-10-CM | POA: Diagnosis not present

## 2019-11-13 DIAGNOSIS — I482 Chronic atrial fibrillation, unspecified: Secondary | ICD-10-CM | POA: Diagnosis not present

## 2019-11-13 DIAGNOSIS — E039 Hypothyroidism, unspecified: Secondary | ICD-10-CM | POA: Diagnosis not present

## 2019-11-13 DIAGNOSIS — K219 Gastro-esophageal reflux disease without esophagitis: Secondary | ICD-10-CM | POA: Diagnosis not present

## 2019-11-28 DIAGNOSIS — Z950 Presence of cardiac pacemaker: Secondary | ICD-10-CM | POA: Diagnosis not present

## 2019-11-28 DIAGNOSIS — E039 Hypothyroidism, unspecified: Secondary | ICD-10-CM | POA: Diagnosis not present

## 2019-11-28 DIAGNOSIS — F0631 Mood disorder due to known physiological condition with depressive features: Secondary | ICD-10-CM | POA: Diagnosis not present

## 2019-11-28 DIAGNOSIS — I495 Sick sinus syndrome: Secondary | ICD-10-CM | POA: Diagnosis not present

## 2019-11-28 DIAGNOSIS — E875 Hyperkalemia: Secondary | ICD-10-CM | POA: Diagnosis not present

## 2019-11-28 DIAGNOSIS — Z6823 Body mass index (BMI) 23.0-23.9, adult: Secondary | ICD-10-CM | POA: Diagnosis not present

## 2019-11-28 DIAGNOSIS — F0390 Unspecified dementia without behavioral disturbance: Secondary | ICD-10-CM | POA: Diagnosis not present

## 2019-11-28 DIAGNOSIS — N183 Chronic kidney disease, stage 3 unspecified: Secondary | ICD-10-CM | POA: Diagnosis not present

## 2019-11-28 DIAGNOSIS — F419 Anxiety disorder, unspecified: Secondary | ICD-10-CM | POA: Diagnosis not present

## 2019-11-28 DIAGNOSIS — F339 Major depressive disorder, recurrent, unspecified: Secondary | ICD-10-CM | POA: Diagnosis not present

## 2019-11-28 DIAGNOSIS — Z9181 History of falling: Secondary | ICD-10-CM | POA: Diagnosis not present

## 2019-11-29 DIAGNOSIS — T17920A Food in respiratory tract, part unspecified causing asphyxiation, initial encounter: Secondary | ICD-10-CM | POA: Diagnosis not present

## 2019-11-29 DIAGNOSIS — R0689 Other abnormalities of breathing: Secondary | ICD-10-CM | POA: Diagnosis not present

## 2019-12-05 DIAGNOSIS — F339 Major depressive disorder, recurrent, unspecified: Secondary | ICD-10-CM | POA: Diagnosis not present

## 2019-12-05 DIAGNOSIS — I4891 Unspecified atrial fibrillation: Secondary | ICD-10-CM | POA: Diagnosis not present

## 2019-12-05 DIAGNOSIS — K219 Gastro-esophageal reflux disease without esophagitis: Secondary | ICD-10-CM | POA: Diagnosis not present

## 2019-12-05 DIAGNOSIS — Z950 Presence of cardiac pacemaker: Secondary | ICD-10-CM | POA: Diagnosis not present

## 2019-12-05 DIAGNOSIS — R339 Retention of urine, unspecified: Secondary | ICD-10-CM | POA: Diagnosis not present

## 2019-12-05 DIAGNOSIS — M1712 Unilateral primary osteoarthritis, left knee: Secondary | ICD-10-CM | POA: Diagnosis not present

## 2019-12-05 DIAGNOSIS — F0390 Unspecified dementia without behavioral disturbance: Secondary | ICD-10-CM | POA: Diagnosis not present

## 2019-12-05 DIAGNOSIS — N183 Chronic kidney disease, stage 3 unspecified: Secondary | ICD-10-CM | POA: Diagnosis not present

## 2019-12-05 DIAGNOSIS — H3530 Unspecified macular degeneration: Secondary | ICD-10-CM | POA: Diagnosis not present

## 2019-12-05 DIAGNOSIS — Z8673 Personal history of transient ischemic attack (TIA), and cerebral infarction without residual deficits: Secondary | ICD-10-CM | POA: Diagnosis not present

## 2019-12-05 DIAGNOSIS — M549 Dorsalgia, unspecified: Secondary | ICD-10-CM | POA: Diagnosis not present

## 2019-12-05 DIAGNOSIS — Z87891 Personal history of nicotine dependence: Secondary | ICD-10-CM | POA: Diagnosis not present

## 2019-12-05 DIAGNOSIS — F0631 Mood disorder due to known physiological condition with depressive features: Secondary | ICD-10-CM | POA: Diagnosis not present

## 2019-12-05 DIAGNOSIS — Z7901 Long term (current) use of anticoagulants: Secondary | ICD-10-CM | POA: Diagnosis not present

## 2019-12-05 DIAGNOSIS — D649 Anemia, unspecified: Secondary | ICD-10-CM | POA: Diagnosis not present

## 2019-12-05 DIAGNOSIS — K5900 Constipation, unspecified: Secondary | ICD-10-CM | POA: Diagnosis not present

## 2019-12-05 DIAGNOSIS — I495 Sick sinus syndrome: Secondary | ICD-10-CM | POA: Diagnosis not present

## 2019-12-05 DIAGNOSIS — M4000 Postural kyphosis, site unspecified: Secondary | ICD-10-CM | POA: Diagnosis not present

## 2019-12-07 DIAGNOSIS — N183 Chronic kidney disease, stage 3 unspecified: Secondary | ICD-10-CM | POA: Diagnosis not present

## 2019-12-07 DIAGNOSIS — M549 Dorsalgia, unspecified: Secondary | ICD-10-CM | POA: Diagnosis not present

## 2019-12-07 DIAGNOSIS — M4000 Postural kyphosis, site unspecified: Secondary | ICD-10-CM | POA: Diagnosis not present

## 2019-12-07 DIAGNOSIS — M1712 Unilateral primary osteoarthritis, left knee: Secondary | ICD-10-CM | POA: Diagnosis not present

## 2019-12-07 DIAGNOSIS — I4891 Unspecified atrial fibrillation: Secondary | ICD-10-CM | POA: Diagnosis not present

## 2019-12-07 DIAGNOSIS — F0390 Unspecified dementia without behavioral disturbance: Secondary | ICD-10-CM | POA: Diagnosis not present

## 2019-12-10 DIAGNOSIS — R404 Transient alteration of awareness: Secondary | ICD-10-CM | POA: Diagnosis not present

## 2019-12-10 DIAGNOSIS — T17920A Food in respiratory tract, part unspecified causing asphyxiation, initial encounter: Secondary | ICD-10-CM | POA: Diagnosis not present

## 2019-12-11 DIAGNOSIS — I4891 Unspecified atrial fibrillation: Secondary | ICD-10-CM | POA: Diagnosis not present

## 2019-12-11 DIAGNOSIS — M549 Dorsalgia, unspecified: Secondary | ICD-10-CM | POA: Diagnosis not present

## 2019-12-11 DIAGNOSIS — F0390 Unspecified dementia without behavioral disturbance: Secondary | ICD-10-CM | POA: Diagnosis not present

## 2019-12-11 DIAGNOSIS — N183 Chronic kidney disease, stage 3 unspecified: Secondary | ICD-10-CM | POA: Diagnosis not present

## 2019-12-11 DIAGNOSIS — M4000 Postural kyphosis, site unspecified: Secondary | ICD-10-CM | POA: Diagnosis not present

## 2019-12-11 DIAGNOSIS — M1712 Unilateral primary osteoarthritis, left knee: Secondary | ICD-10-CM | POA: Diagnosis not present

## 2019-12-13 DIAGNOSIS — E039 Hypothyroidism, unspecified: Secondary | ICD-10-CM | POA: Diagnosis not present

## 2019-12-13 DIAGNOSIS — N183 Chronic kidney disease, stage 3 unspecified: Secondary | ICD-10-CM | POA: Diagnosis not present

## 2019-12-13 DIAGNOSIS — K219 Gastro-esophageal reflux disease without esophagitis: Secondary | ICD-10-CM | POA: Diagnosis not present

## 2019-12-14 DIAGNOSIS — N183 Chronic kidney disease, stage 3 unspecified: Secondary | ICD-10-CM | POA: Diagnosis not present

## 2019-12-14 DIAGNOSIS — F0390 Unspecified dementia without behavioral disturbance: Secondary | ICD-10-CM | POA: Diagnosis not present

## 2019-12-14 DIAGNOSIS — M549 Dorsalgia, unspecified: Secondary | ICD-10-CM | POA: Diagnosis not present

## 2019-12-14 DIAGNOSIS — M4000 Postural kyphosis, site unspecified: Secondary | ICD-10-CM | POA: Diagnosis not present

## 2019-12-14 DIAGNOSIS — M1712 Unilateral primary osteoarthritis, left knee: Secondary | ICD-10-CM | POA: Diagnosis not present

## 2019-12-14 DIAGNOSIS — I4891 Unspecified atrial fibrillation: Secondary | ICD-10-CM | POA: Diagnosis not present

## 2019-12-18 DIAGNOSIS — M549 Dorsalgia, unspecified: Secondary | ICD-10-CM | POA: Diagnosis not present

## 2019-12-18 DIAGNOSIS — M1712 Unilateral primary osteoarthritis, left knee: Secondary | ICD-10-CM | POA: Diagnosis not present

## 2019-12-18 DIAGNOSIS — Z515 Encounter for palliative care: Secondary | ICD-10-CM | POA: Diagnosis not present

## 2019-12-18 DIAGNOSIS — F0390 Unspecified dementia without behavioral disturbance: Secondary | ICD-10-CM | POA: Diagnosis not present

## 2019-12-18 DIAGNOSIS — M4000 Postural kyphosis, site unspecified: Secondary | ICD-10-CM | POA: Diagnosis not present

## 2019-12-18 DIAGNOSIS — F0151 Vascular dementia with behavioral disturbance: Secondary | ICD-10-CM | POA: Diagnosis not present

## 2019-12-18 DIAGNOSIS — N183 Chronic kidney disease, stage 3 unspecified: Secondary | ICD-10-CM | POA: Diagnosis not present

## 2019-12-18 DIAGNOSIS — I4891 Unspecified atrial fibrillation: Secondary | ICD-10-CM | POA: Diagnosis not present

## 2019-12-19 DIAGNOSIS — L609 Nail disorder, unspecified: Secondary | ICD-10-CM | POA: Diagnosis not present

## 2019-12-19 DIAGNOSIS — L110 Acquired keratosis follicularis: Secondary | ICD-10-CM | POA: Diagnosis not present

## 2019-12-19 DIAGNOSIS — I739 Peripheral vascular disease, unspecified: Secondary | ICD-10-CM | POA: Diagnosis not present

## 2019-12-21 DIAGNOSIS — F0390 Unspecified dementia without behavioral disturbance: Secondary | ICD-10-CM | POA: Diagnosis not present

## 2019-12-21 DIAGNOSIS — M1712 Unilateral primary osteoarthritis, left knee: Secondary | ICD-10-CM | POA: Diagnosis not present

## 2019-12-21 DIAGNOSIS — I4891 Unspecified atrial fibrillation: Secondary | ICD-10-CM | POA: Diagnosis not present

## 2019-12-21 DIAGNOSIS — M549 Dorsalgia, unspecified: Secondary | ICD-10-CM | POA: Diagnosis not present

## 2019-12-21 DIAGNOSIS — N183 Chronic kidney disease, stage 3 unspecified: Secondary | ICD-10-CM | POA: Diagnosis not present

## 2019-12-21 DIAGNOSIS — M4000 Postural kyphosis, site unspecified: Secondary | ICD-10-CM | POA: Diagnosis not present

## 2019-12-25 DIAGNOSIS — M4000 Postural kyphosis, site unspecified: Secondary | ICD-10-CM | POA: Diagnosis not present

## 2019-12-25 DIAGNOSIS — M1712 Unilateral primary osteoarthritis, left knee: Secondary | ICD-10-CM | POA: Diagnosis not present

## 2019-12-25 DIAGNOSIS — M549 Dorsalgia, unspecified: Secondary | ICD-10-CM | POA: Diagnosis not present

## 2019-12-25 DIAGNOSIS — N183 Chronic kidney disease, stage 3 unspecified: Secondary | ICD-10-CM | POA: Diagnosis not present

## 2019-12-25 DIAGNOSIS — I4891 Unspecified atrial fibrillation: Secondary | ICD-10-CM | POA: Diagnosis not present

## 2019-12-25 DIAGNOSIS — F0390 Unspecified dementia without behavioral disturbance: Secondary | ICD-10-CM | POA: Diagnosis not present

## 2019-12-28 DIAGNOSIS — M4000 Postural kyphosis, site unspecified: Secondary | ICD-10-CM | POA: Diagnosis not present

## 2019-12-28 DIAGNOSIS — N183 Chronic kidney disease, stage 3 unspecified: Secondary | ICD-10-CM | POA: Diagnosis not present

## 2019-12-28 DIAGNOSIS — M549 Dorsalgia, unspecified: Secondary | ICD-10-CM | POA: Diagnosis not present

## 2019-12-28 DIAGNOSIS — I4891 Unspecified atrial fibrillation: Secondary | ICD-10-CM | POA: Diagnosis not present

## 2019-12-28 DIAGNOSIS — M1712 Unilateral primary osteoarthritis, left knee: Secondary | ICD-10-CM | POA: Diagnosis not present

## 2019-12-28 DIAGNOSIS — F0390 Unspecified dementia without behavioral disturbance: Secondary | ICD-10-CM | POA: Diagnosis not present

## 2020-01-03 DIAGNOSIS — M1712 Unilateral primary osteoarthritis, left knee: Secondary | ICD-10-CM | POA: Diagnosis not present

## 2020-01-03 DIAGNOSIS — Z23 Encounter for immunization: Secondary | ICD-10-CM | POA: Diagnosis not present

## 2020-01-03 DIAGNOSIS — N183 Chronic kidney disease, stage 3 unspecified: Secondary | ICD-10-CM | POA: Diagnosis not present

## 2020-01-03 DIAGNOSIS — I4891 Unspecified atrial fibrillation: Secondary | ICD-10-CM | POA: Diagnosis not present

## 2020-01-03 DIAGNOSIS — M549 Dorsalgia, unspecified: Secondary | ICD-10-CM | POA: Diagnosis not present

## 2020-01-03 DIAGNOSIS — F0390 Unspecified dementia without behavioral disturbance: Secondary | ICD-10-CM | POA: Diagnosis not present

## 2020-01-03 DIAGNOSIS — M4000 Postural kyphosis, site unspecified: Secondary | ICD-10-CM | POA: Diagnosis not present

## 2020-01-04 DIAGNOSIS — D649 Anemia, unspecified: Secondary | ICD-10-CM | POA: Diagnosis not present

## 2020-01-04 DIAGNOSIS — N183 Chronic kidney disease, stage 3 unspecified: Secondary | ICD-10-CM | POA: Diagnosis not present

## 2020-01-04 DIAGNOSIS — K5900 Constipation, unspecified: Secondary | ICD-10-CM | POA: Diagnosis not present

## 2020-01-04 DIAGNOSIS — F339 Major depressive disorder, recurrent, unspecified: Secondary | ICD-10-CM | POA: Diagnosis not present

## 2020-01-04 DIAGNOSIS — F0631 Mood disorder due to known physiological condition with depressive features: Secondary | ICD-10-CM | POA: Diagnosis not present

## 2020-01-04 DIAGNOSIS — M4000 Postural kyphosis, site unspecified: Secondary | ICD-10-CM | POA: Diagnosis not present

## 2020-01-04 DIAGNOSIS — I4891 Unspecified atrial fibrillation: Secondary | ICD-10-CM | POA: Diagnosis not present

## 2020-01-04 DIAGNOSIS — I495 Sick sinus syndrome: Secondary | ICD-10-CM | POA: Diagnosis not present

## 2020-01-04 DIAGNOSIS — K219 Gastro-esophageal reflux disease without esophagitis: Secondary | ICD-10-CM | POA: Diagnosis not present

## 2020-01-04 DIAGNOSIS — Z7901 Long term (current) use of anticoagulants: Secondary | ICD-10-CM | POA: Diagnosis not present

## 2020-01-04 DIAGNOSIS — M1712 Unilateral primary osteoarthritis, left knee: Secondary | ICD-10-CM | POA: Diagnosis not present

## 2020-01-04 DIAGNOSIS — M549 Dorsalgia, unspecified: Secondary | ICD-10-CM | POA: Diagnosis not present

## 2020-01-04 DIAGNOSIS — Z8673 Personal history of transient ischemic attack (TIA), and cerebral infarction without residual deficits: Secondary | ICD-10-CM | POA: Diagnosis not present

## 2020-01-04 DIAGNOSIS — R339 Retention of urine, unspecified: Secondary | ICD-10-CM | POA: Diagnosis not present

## 2020-01-04 DIAGNOSIS — Z950 Presence of cardiac pacemaker: Secondary | ICD-10-CM | POA: Diagnosis not present

## 2020-01-04 DIAGNOSIS — Z87891 Personal history of nicotine dependence: Secondary | ICD-10-CM | POA: Diagnosis not present

## 2020-01-04 DIAGNOSIS — H3530 Unspecified macular degeneration: Secondary | ICD-10-CM | POA: Diagnosis not present

## 2020-01-04 DIAGNOSIS — F0390 Unspecified dementia without behavioral disturbance: Secondary | ICD-10-CM | POA: Diagnosis not present

## 2020-01-10 DIAGNOSIS — Z23 Encounter for immunization: Secondary | ICD-10-CM | POA: Diagnosis not present

## 2020-01-11 DIAGNOSIS — M549 Dorsalgia, unspecified: Secondary | ICD-10-CM | POA: Diagnosis not present

## 2020-01-11 DIAGNOSIS — N183 Chronic kidney disease, stage 3 unspecified: Secondary | ICD-10-CM | POA: Diagnosis not present

## 2020-01-11 DIAGNOSIS — F0390 Unspecified dementia without behavioral disturbance: Secondary | ICD-10-CM | POA: Diagnosis not present

## 2020-01-11 DIAGNOSIS — M4000 Postural kyphosis, site unspecified: Secondary | ICD-10-CM | POA: Diagnosis not present

## 2020-01-11 DIAGNOSIS — M1712 Unilateral primary osteoarthritis, left knee: Secondary | ICD-10-CM | POA: Diagnosis not present

## 2020-01-11 DIAGNOSIS — I4891 Unspecified atrial fibrillation: Secondary | ICD-10-CM | POA: Diagnosis not present

## 2020-01-15 DIAGNOSIS — F0151 Vascular dementia with behavioral disturbance: Secondary | ICD-10-CM | POA: Diagnosis not present

## 2020-01-15 DIAGNOSIS — Z515 Encounter for palliative care: Secondary | ICD-10-CM | POA: Diagnosis not present

## 2020-01-18 DIAGNOSIS — M549 Dorsalgia, unspecified: Secondary | ICD-10-CM | POA: Diagnosis not present

## 2020-01-18 DIAGNOSIS — N183 Chronic kidney disease, stage 3 unspecified: Secondary | ICD-10-CM | POA: Diagnosis not present

## 2020-01-18 DIAGNOSIS — M4000 Postural kyphosis, site unspecified: Secondary | ICD-10-CM | POA: Diagnosis not present

## 2020-01-18 DIAGNOSIS — F0390 Unspecified dementia without behavioral disturbance: Secondary | ICD-10-CM | POA: Diagnosis not present

## 2020-01-18 DIAGNOSIS — I4891 Unspecified atrial fibrillation: Secondary | ICD-10-CM | POA: Diagnosis not present

## 2020-01-18 DIAGNOSIS — M1712 Unilateral primary osteoarthritis, left knee: Secondary | ICD-10-CM | POA: Diagnosis not present

## 2020-01-23 DIAGNOSIS — K219 Gastro-esophageal reflux disease without esophagitis: Secondary | ICD-10-CM | POA: Diagnosis not present

## 2020-01-23 DIAGNOSIS — Z13220 Encounter for screening for lipoid disorders: Secondary | ICD-10-CM | POA: Diagnosis not present

## 2020-01-23 DIAGNOSIS — E039 Hypothyroidism, unspecified: Secondary | ICD-10-CM | POA: Diagnosis not present

## 2020-01-23 DIAGNOSIS — N183 Chronic kidney disease, stage 3 unspecified: Secondary | ICD-10-CM | POA: Diagnosis not present

## 2020-01-23 DIAGNOSIS — E875 Hyperkalemia: Secondary | ICD-10-CM | POA: Diagnosis not present

## 2020-01-25 DIAGNOSIS — F419 Anxiety disorder, unspecified: Secondary | ICD-10-CM | POA: Diagnosis not present

## 2020-01-25 DIAGNOSIS — F0631 Mood disorder due to known physiological condition with depressive features: Secondary | ICD-10-CM | POA: Diagnosis not present

## 2020-01-25 DIAGNOSIS — M1712 Unilateral primary osteoarthritis, left knee: Secondary | ICD-10-CM | POA: Diagnosis not present

## 2020-01-25 DIAGNOSIS — I495 Sick sinus syndrome: Secondary | ICD-10-CM | POA: Diagnosis not present

## 2020-01-25 DIAGNOSIS — F0390 Unspecified dementia without behavioral disturbance: Secondary | ICD-10-CM | POA: Diagnosis not present

## 2020-01-25 DIAGNOSIS — M549 Dorsalgia, unspecified: Secondary | ICD-10-CM | POA: Diagnosis not present

## 2020-01-25 DIAGNOSIS — N183 Chronic kidney disease, stage 3 unspecified: Secondary | ICD-10-CM | POA: Diagnosis not present

## 2020-01-25 DIAGNOSIS — Z6823 Body mass index (BMI) 23.0-23.9, adult: Secondary | ICD-10-CM | POA: Diagnosis not present

## 2020-01-25 DIAGNOSIS — I4891 Unspecified atrial fibrillation: Secondary | ICD-10-CM | POA: Diagnosis not present

## 2020-01-25 DIAGNOSIS — Z950 Presence of cardiac pacemaker: Secondary | ICD-10-CM | POA: Diagnosis not present

## 2020-01-25 DIAGNOSIS — M4000 Postural kyphosis, site unspecified: Secondary | ICD-10-CM | POA: Diagnosis not present

## 2020-01-25 DIAGNOSIS — F339 Major depressive disorder, recurrent, unspecified: Secondary | ICD-10-CM | POA: Diagnosis not present

## 2020-02-01 DIAGNOSIS — M549 Dorsalgia, unspecified: Secondary | ICD-10-CM | POA: Diagnosis not present

## 2020-02-01 DIAGNOSIS — I4891 Unspecified atrial fibrillation: Secondary | ICD-10-CM | POA: Diagnosis not present

## 2020-02-01 DIAGNOSIS — F0390 Unspecified dementia without behavioral disturbance: Secondary | ICD-10-CM | POA: Diagnosis not present

## 2020-02-01 DIAGNOSIS — N183 Chronic kidney disease, stage 3 unspecified: Secondary | ICD-10-CM | POA: Diagnosis not present

## 2020-02-01 DIAGNOSIS — M1712 Unilateral primary osteoarthritis, left knee: Secondary | ICD-10-CM | POA: Diagnosis not present

## 2020-02-01 DIAGNOSIS — M4000 Postural kyphosis, site unspecified: Secondary | ICD-10-CM | POA: Diagnosis not present

## 2020-02-03 DIAGNOSIS — F0390 Unspecified dementia without behavioral disturbance: Secondary | ICD-10-CM | POA: Diagnosis not present

## 2020-02-14 DIAGNOSIS — F0151 Vascular dementia with behavioral disturbance: Secondary | ICD-10-CM | POA: Diagnosis not present

## 2020-02-14 DIAGNOSIS — Z515 Encounter for palliative care: Secondary | ICD-10-CM | POA: Diagnosis not present

## 2020-02-27 DIAGNOSIS — L110 Acquired keratosis follicularis: Secondary | ICD-10-CM | POA: Diagnosis not present

## 2020-02-27 DIAGNOSIS — I739 Peripheral vascular disease, unspecified: Secondary | ICD-10-CM | POA: Diagnosis not present

## 2020-02-27 DIAGNOSIS — L609 Nail disorder, unspecified: Secondary | ICD-10-CM | POA: Diagnosis not present

## 2020-03-17 ENCOUNTER — Other Ambulatory Visit: Payer: Self-pay | Admitting: Cardiology

## 2020-03-19 DIAGNOSIS — F0151 Vascular dementia with behavioral disturbance: Secondary | ICD-10-CM | POA: Diagnosis not present

## 2020-03-19 DIAGNOSIS — Z515 Encounter for palliative care: Secondary | ICD-10-CM | POA: Diagnosis not present

## 2020-04-12 DIAGNOSIS — E039 Hypothyroidism, unspecified: Secondary | ICD-10-CM | POA: Diagnosis not present

## 2020-04-12 DIAGNOSIS — K219 Gastro-esophageal reflux disease without esophagitis: Secondary | ICD-10-CM | POA: Diagnosis not present

## 2020-04-12 DIAGNOSIS — I482 Chronic atrial fibrillation, unspecified: Secondary | ICD-10-CM | POA: Diagnosis not present

## 2020-04-12 DIAGNOSIS — N183 Chronic kidney disease, stage 3 unspecified: Secondary | ICD-10-CM | POA: Diagnosis not present

## 2020-04-15 ENCOUNTER — Ambulatory Visit: Payer: MEDICARE | Admitting: Cardiology

## 2020-04-15 NOTE — Progress Notes (Deleted)
Clinical Summary Wesley Dyer is a 87 y.o.male seen today for follow up of the following medical problems.      1. Afib - last visit he had some elevated heart rates on recent device check. He reported occasional palpitations, fatigue. We started lopressor 12.42m bid.   - no recent palpitatinos - family does not recall if he is taking lorpessor or not, had started previously for some high heart rates on device check   2. Tachy-brady syndrome - pacemaker followed by Dr ARayann Heman  - overdue for device check    3.Hypothyroidism - followed by pcp   Past Medical History:  Diagnosis Date  . Chest pain    unspecified  . Chest pain, atypical   . Depression   . Depression   . Drug therapy    Coumadin Therapy  . Gastroesophageal reflux disease   . Gastroesophageal reflux disease   . Hypothyroidism   . Paroxysmal atrial fibrillation ([facility] May 2011  . Tachycardia-bradycardia syndrome ([facility]    7 second pauses documented  . Transient ischemic attack    high risk for thromboembolic disease, secondary to atrial fibrillation, patient declines Coumadin due to prior history of severe epistaxis. Patient not willing to restart Coumadin therapy     Allergies  Allergen Reactions  . Warfarin Sodium Other (See Comments)    REACTION: bleeding     Current Outpatient Medications  Medication Sig Dispense Refill  . acetaminophen (TYLENOL) 325 MG tablet Take 650 mg by mouth every 6 (six) hours as needed for mild pain or headache.    .Marland KitchenELIQUIS 5 MG TABS tablet TAKE 1 TABLET(5 MG) BY MOUTH TWICE DAILY 120 tablet 3  . levothyroxine (SYNTHROID, LEVOTHROID) 75 MCG tablet Take 1 tablet by mouth daily.    . memantine (NAMENDA) 10 MG tablet Take 1 tablet by mouth 2 (two) times daily.  0  . Multiple Vitamins-Minerals (OCUVITE PRESERVISION) TABS Take 1 tablet by mouth 2 (two) times daily.      . pantoprazole (PROTONIX) 40 MG tablet Take 1 tablet by mouth daily.    . simvastatin  (ZOCOR) 20 MG tablet Take 20 mg by mouth at bedtime.      . tamsulosin (FLOMAX) 0.4 MG CAPS capsule Take 0.4 mg by mouth 2 (two) times daily.      No current facility-administered medications for this visit.     Past Surgical History:  Procedure Laterality Date  . ABDOMINAL SURGERY     Abdominal diverticultis  . ENDOSCOPIC RETROGRADE CHOLANGIOPANCREATOGRAPHY (ERCP) WITH PROPOFOL N/A 11/01/2017   Procedure: ENDOSCOPIC RETROGRADE CHOLANGIOPANCREATOGRAPHY (ERCP) WITH PROPOFOL;  Surgeon: MRush LandmarkGTelford Nab, MD;  [place]  Service: Gastroenterology;  Laterality: N/A;  . PACEMAKER INSERTION  07/06/12   MDT Adapta L implanted by Dr KCaryl Comes . PERMANENT PACEMAKER INSERTION N/A 07/06/2012   Procedure: PERMANENT PACEMAKER INSERTION;  Surgeon: SDeboraha Sprang MD;  [facility];  Service: Cardiovascular;  Laterality: N/A;  . SUBMUCOSAL INJECTION  11/01/2017   Procedure: SUBMUCOSAL INJECTION;  Surgeon: MRush LandmarkGTelford Nab, MD;  [facility];  Service: Gastroenterology;;     Allergies  Allergen Reactions  . Warfarin Sodium Other (See Comments)    REACTION: bleeding      Family History  Problem Relation Age of Onset  . Coronary artery disease Other   . Hyperlipidemia Other      Social History Mr. WValleryreports that he quit smoking about 78 years ago. His smoking use included cigarettes. He started  smoking about 88 years ago. He has a 5.00 pack-year smoking history. He quit smokeless tobacco use about 15 years ago.  His smokeless tobacco use included chew. Mr. Cortese reports no history of alcohol use.   Review of Systems CONSTITUTIONAL: No weight loss, fever, chills, weakness or fatigue.  HEENT: Eyes: No visual loss, blurred vision, double vision or yellow sclerae.No hearing loss, sneezing, congestion, runny nose or sore throat.  SKIN: No rash or itching.  CARDIOVASCULAR:  RESPIRATORY: No shortness of breath, cough or sputum.  GASTROINTESTINAL:  No anorexia, nausea, vomiting or diarrhea. No abdominal pain or blood.  GENITOURINARY: No burning on urination, no polyuria NEUROLOGICAL: No headache, dizziness, syncope, paralysis, ataxia, numbness or tingling in the extremities. No change in bowel or bladder control.  MUSCULOSKELETAL: No muscle, back pain, joint pain or stiffness.  LYMPHATICS: No enlarged nodes. No history of splenectomy.  PSYCHIATRIC: No history of depression or anxiety.  ENDOCRINOLOGIC: No reports of sweating, cold or heat intolerance. No polyuria or polydipsia.  Marland Kitchen   Physical Examination There were no vitals filed for this visit. There were no vitals filed for this visit.  Gen: resting comfortably, no acute distress HEENT: no scleral icterus, pupils equal round and reactive, no palptable cervical adenopathy,  CV Resp: Clear to auscultation bilaterally GI: abdomen is soft, non-tender, non-distended, normal bowel sounds, no hepatosplenomegaly MSK: extremities are warm, no edema.  Skin: warm, no rash Neuro:  no focal deficits Psych: appropriate affect   Diagnostic Studies 07/2013 Echo Study Conclusions  - Left ventricle: The cavity size was normal. Wall thickness was increased in a pattern of mild LVH. Systolic function was normal. The estimated ejection fraction was in the range of 55% to 60%. Wall motion was normal; there were no regional wall motion abnormalities. Doppler parameters are consistent with abnormal left ventricular relaxation (grade 1 diastolic dysfunction). - Aortic valve: Trileaflet; mildly thickened, mildly calcified leaflets. Trivial regurgitation. - Mitral valve: Calcified annulus. Mildly thickened leaflets . Trivial regurgitation. - Left atrium: The atrium was at the upper limits of normal in size. - Right ventricle: The cavity size was mildly dilated. Pacer wire or catheter noted in right ventricle. - Right atrium: Central venous pressure: 69m Hg (est). -  Tricuspid valve: Mild regurgitation. - Pulmonary arteries: PA peak pressure: 390mHg (S). - Pericardium, extracardiac: There was no pericardial effusion. Impressions:  - Mild LVH with LVEF 5553-64%grade 1 diastolic dysfunction. Upper normal left atrial chamber size. MAC with trivial mitral regurgitation. Mildly sclerotic aortic valve with trivial aortic regurgitation. Mild RV enlargement, device wire noted. Mild tricuspid regurgitation with PASP 30 mm mercury.    Assessment and Plan  1. Afib -no symptoms - appears he is not taking the lopressor that was previously prescribed - denies symptoms, EKG today shows a paced v sensed - follow up device check, if heart rate issues would restart his lopressor 12.71m98mid   2. Tachy-brady syndrome -overdue for device check - will look to arrange device clinic appt      JonArnoldo Lenis.D.

## 2020-04-29 DIAGNOSIS — Z13220 Encounter for screening for lipoid disorders: Secondary | ICD-10-CM | POA: Diagnosis not present

## 2020-04-29 DIAGNOSIS — E039 Hypothyroidism, unspecified: Secondary | ICD-10-CM | POA: Diagnosis not present

## 2020-04-29 DIAGNOSIS — N183 Chronic kidney disease, stage 3 unspecified: Secondary | ICD-10-CM | POA: Diagnosis not present

## 2020-05-05 DIAGNOSIS — E039 Hypothyroidism, unspecified: Secondary | ICD-10-CM | POA: Diagnosis not present

## 2020-05-05 DIAGNOSIS — F0390 Unspecified dementia without behavioral disturbance: Secondary | ICD-10-CM | POA: Diagnosis not present

## 2020-05-05 DIAGNOSIS — Z950 Presence of cardiac pacemaker: Secondary | ICD-10-CM | POA: Diagnosis not present

## 2020-05-05 DIAGNOSIS — E875 Hyperkalemia: Secondary | ICD-10-CM | POA: Diagnosis not present

## 2020-05-05 DIAGNOSIS — Z0001 Encounter for general adult medical examination with abnormal findings: Secondary | ICD-10-CM | POA: Diagnosis not present

## 2020-05-05 DIAGNOSIS — F0631 Mood disorder due to known physiological condition with depressive features: Secondary | ICD-10-CM | POA: Diagnosis not present

## 2020-05-05 DIAGNOSIS — I495 Sick sinus syndrome: Secondary | ICD-10-CM | POA: Diagnosis not present

## 2020-05-14 DIAGNOSIS — L110 Acquired keratosis follicularis: Secondary | ICD-10-CM | POA: Diagnosis not present

## 2020-05-14 DIAGNOSIS — L609 Nail disorder, unspecified: Secondary | ICD-10-CM | POA: Diagnosis not present

## 2020-05-14 DIAGNOSIS — I739 Peripheral vascular disease, unspecified: Secondary | ICD-10-CM | POA: Diagnosis not present

## 2020-06-04 DIAGNOSIS — Z515 Encounter for palliative care: Secondary | ICD-10-CM | POA: Diagnosis not present

## 2020-06-04 DIAGNOSIS — F0151 Vascular dementia with behavioral disturbance: Secondary | ICD-10-CM | POA: Diagnosis not present

## 2020-06-11 DIAGNOSIS — K219 Gastro-esophageal reflux disease without esophagitis: Secondary | ICD-10-CM | POA: Diagnosis not present

## 2020-06-11 DIAGNOSIS — I482 Chronic atrial fibrillation, unspecified: Secondary | ICD-10-CM | POA: Diagnosis not present

## 2020-06-11 DIAGNOSIS — E039 Hypothyroidism, unspecified: Secondary | ICD-10-CM | POA: Diagnosis not present

## 2020-06-11 DIAGNOSIS — N183 Chronic kidney disease, stage 3 unspecified: Secondary | ICD-10-CM | POA: Diagnosis not present

## 2020-07-16 DIAGNOSIS — F0151 Vascular dementia with behavioral disturbance: Secondary | ICD-10-CM | POA: Diagnosis not present

## 2020-07-16 DIAGNOSIS — Z515 Encounter for palliative care: Secondary | ICD-10-CM | POA: Diagnosis not present

## 2020-07-21 DIAGNOSIS — F0390 Unspecified dementia without behavioral disturbance: Secondary | ICD-10-CM | POA: Diagnosis not present

## 2020-07-21 DIAGNOSIS — K5900 Constipation, unspecified: Secondary | ICD-10-CM | POA: Diagnosis not present

## 2020-07-30 DIAGNOSIS — D519 Vitamin B12 deficiency anemia, unspecified: Secondary | ICD-10-CM | POA: Diagnosis not present

## 2020-07-30 DIAGNOSIS — N401 Enlarged prostate with lower urinary tract symptoms: Secondary | ICD-10-CM | POA: Diagnosis not present

## 2020-07-30 DIAGNOSIS — E039 Hypothyroidism, unspecified: Secondary | ICD-10-CM | POA: Diagnosis not present

## 2020-07-30 DIAGNOSIS — E559 Vitamin D deficiency, unspecified: Secondary | ICD-10-CM | POA: Diagnosis not present

## 2020-07-30 DIAGNOSIS — N183 Chronic kidney disease, stage 3 unspecified: Secondary | ICD-10-CM | POA: Diagnosis not present

## 2020-07-30 DIAGNOSIS — D649 Anemia, unspecified: Secondary | ICD-10-CM | POA: Diagnosis not present

## 2020-07-30 DIAGNOSIS — E875 Hyperkalemia: Secondary | ICD-10-CM | POA: Diagnosis not present

## 2020-08-05 DIAGNOSIS — F0391 Unspecified dementia with behavioral disturbance: Secondary | ICD-10-CM | POA: Diagnosis not present

## 2020-08-05 DIAGNOSIS — F0631 Mood disorder due to known physiological condition with depressive features: Secondary | ICD-10-CM | POA: Diagnosis not present

## 2020-08-05 DIAGNOSIS — Z950 Presence of cardiac pacemaker: Secondary | ICD-10-CM | POA: Diagnosis not present

## 2020-08-05 DIAGNOSIS — R2681 Unsteadiness on feet: Secondary | ICD-10-CM | POA: Diagnosis not present

## 2020-08-05 DIAGNOSIS — I495 Sick sinus syndrome: Secondary | ICD-10-CM | POA: Diagnosis not present

## 2020-08-05 DIAGNOSIS — M4000 Postural kyphosis, site unspecified: Secondary | ICD-10-CM | POA: Diagnosis not present

## 2020-08-05 DIAGNOSIS — E875 Hyperkalemia: Secondary | ICD-10-CM | POA: Diagnosis not present

## 2020-08-06 DIAGNOSIS — I739 Peripheral vascular disease, unspecified: Secondary | ICD-10-CM | POA: Diagnosis not present

## 2020-08-06 DIAGNOSIS — L110 Acquired keratosis follicularis: Secondary | ICD-10-CM | POA: Diagnosis not present

## 2020-08-06 DIAGNOSIS — L609 Nail disorder, unspecified: Secondary | ICD-10-CM | POA: Diagnosis not present

## 2020-08-11 DIAGNOSIS — K219 Gastro-esophageal reflux disease without esophagitis: Secondary | ICD-10-CM | POA: Diagnosis not present

## 2020-08-11 DIAGNOSIS — E039 Hypothyroidism, unspecified: Secondary | ICD-10-CM | POA: Diagnosis not present

## 2020-08-11 DIAGNOSIS — I482 Chronic atrial fibrillation, unspecified: Secondary | ICD-10-CM | POA: Diagnosis not present

## 2020-08-11 DIAGNOSIS — N183 Chronic kidney disease, stage 3 unspecified: Secondary | ICD-10-CM | POA: Diagnosis not present

## 2020-08-27 DIAGNOSIS — Z515 Encounter for palliative care: Secondary | ICD-10-CM | POA: Diagnosis not present

## 2020-08-27 DIAGNOSIS — F0151 Vascular dementia with behavioral disturbance: Secondary | ICD-10-CM | POA: Diagnosis not present

## 2020-10-08 DIAGNOSIS — Z515 Encounter for palliative care: Secondary | ICD-10-CM | POA: Diagnosis not present

## 2020-10-08 DIAGNOSIS — F0151 Vascular dementia with behavioral disturbance: Secondary | ICD-10-CM | POA: Diagnosis not present

## 2020-10-12 DIAGNOSIS — N183 Chronic kidney disease, stage 3 unspecified: Secondary | ICD-10-CM | POA: Diagnosis not present

## 2020-10-12 DIAGNOSIS — I482 Chronic atrial fibrillation, unspecified: Secondary | ICD-10-CM | POA: Diagnosis not present

## 2020-10-12 DIAGNOSIS — E039 Hypothyroidism, unspecified: Secondary | ICD-10-CM | POA: Diagnosis not present

## 2020-10-12 DIAGNOSIS — K219 Gastro-esophageal reflux disease without esophagitis: Secondary | ICD-10-CM | POA: Diagnosis not present

## 2020-10-22 DIAGNOSIS — L110 Acquired keratosis follicularis: Secondary | ICD-10-CM | POA: Diagnosis not present

## 2020-10-22 DIAGNOSIS — I739 Peripheral vascular disease, unspecified: Secondary | ICD-10-CM | POA: Diagnosis not present

## 2020-10-22 DIAGNOSIS — L609 Nail disorder, unspecified: Secondary | ICD-10-CM | POA: Diagnosis not present

## 2020-10-28 DIAGNOSIS — F0151 Vascular dementia with behavioral disturbance: Secondary | ICD-10-CM | POA: Diagnosis not present

## 2020-10-28 DIAGNOSIS — Z515 Encounter for palliative care: Secondary | ICD-10-CM | POA: Diagnosis not present

## 2020-10-29 DIAGNOSIS — R634 Abnormal weight loss: Secondary | ICD-10-CM | POA: Diagnosis not present

## 2020-10-29 DIAGNOSIS — R630 Anorexia: Secondary | ICD-10-CM | POA: Diagnosis not present

## 2020-10-29 DIAGNOSIS — I10 Essential (primary) hypertension: Secondary | ICD-10-CM | POA: Diagnosis not present

## 2020-10-29 DIAGNOSIS — K219 Gastro-esophageal reflux disease without esophagitis: Secondary | ICD-10-CM | POA: Diagnosis not present

## 2020-10-29 DIAGNOSIS — I4891 Unspecified atrial fibrillation: Secondary | ICD-10-CM | POA: Diagnosis not present

## 2020-10-29 DIAGNOSIS — N401 Enlarged prostate with lower urinary tract symptoms: Secondary | ICD-10-CM | POA: Diagnosis not present

## 2020-10-29 DIAGNOSIS — E039 Hypothyroidism, unspecified: Secondary | ICD-10-CM | POA: Diagnosis not present

## 2020-10-29 DIAGNOSIS — E569 Vitamin deficiency, unspecified: Secondary | ICD-10-CM | POA: Diagnosis not present

## 2020-10-29 DIAGNOSIS — R531 Weakness: Secondary | ICD-10-CM | POA: Diagnosis not present

## 2020-10-29 DIAGNOSIS — N183 Chronic kidney disease, stage 3 unspecified: Secondary | ICD-10-CM | POA: Diagnosis not present

## 2020-10-29 DIAGNOSIS — G311 Senile degeneration of brain, not elsewhere classified: Secondary | ICD-10-CM | POA: Diagnosis not present

## 2020-10-29 DIAGNOSIS — M1990 Unspecified osteoarthritis, unspecified site: Secondary | ICD-10-CM | POA: Diagnosis not present

## 2020-10-29 DIAGNOSIS — H3530 Unspecified macular degeneration: Secondary | ICD-10-CM | POA: Diagnosis not present

## 2020-10-31 DIAGNOSIS — K219 Gastro-esophageal reflux disease without esophagitis: Secondary | ICD-10-CM | POA: Diagnosis not present

## 2020-10-31 DIAGNOSIS — N401 Enlarged prostate with lower urinary tract symptoms: Secondary | ICD-10-CM | POA: Diagnosis not present

## 2020-10-31 DIAGNOSIS — G311 Senile degeneration of brain, not elsewhere classified: Secondary | ICD-10-CM | POA: Diagnosis not present

## 2020-10-31 DIAGNOSIS — N183 Chronic kidney disease, stage 3 unspecified: Secondary | ICD-10-CM | POA: Diagnosis not present

## 2020-10-31 DIAGNOSIS — I4891 Unspecified atrial fibrillation: Secondary | ICD-10-CM | POA: Diagnosis not present

## 2020-10-31 DIAGNOSIS — I10 Essential (primary) hypertension: Secondary | ICD-10-CM | POA: Diagnosis not present

## 2020-11-01 DIAGNOSIS — I4891 Unspecified atrial fibrillation: Secondary | ICD-10-CM | POA: Diagnosis not present

## 2020-11-01 DIAGNOSIS — K219 Gastro-esophageal reflux disease without esophagitis: Secondary | ICD-10-CM | POA: Diagnosis not present

## 2020-11-01 DIAGNOSIS — N401 Enlarged prostate with lower urinary tract symptoms: Secondary | ICD-10-CM | POA: Diagnosis not present

## 2020-11-01 DIAGNOSIS — I10 Essential (primary) hypertension: Secondary | ICD-10-CM | POA: Diagnosis not present

## 2020-11-01 DIAGNOSIS — G311 Senile degeneration of brain, not elsewhere classified: Secondary | ICD-10-CM | POA: Diagnosis not present

## 2020-11-01 DIAGNOSIS — N183 Chronic kidney disease, stage 3 unspecified: Secondary | ICD-10-CM | POA: Diagnosis not present

## 2020-11-03 DIAGNOSIS — K219 Gastro-esophageal reflux disease without esophagitis: Secondary | ICD-10-CM | POA: Diagnosis not present

## 2020-11-03 DIAGNOSIS — N401 Enlarged prostate with lower urinary tract symptoms: Secondary | ICD-10-CM | POA: Diagnosis not present

## 2020-11-03 DIAGNOSIS — I4891 Unspecified atrial fibrillation: Secondary | ICD-10-CM | POA: Diagnosis not present

## 2020-11-03 DIAGNOSIS — N183 Chronic kidney disease, stage 3 unspecified: Secondary | ICD-10-CM | POA: Diagnosis not present

## 2020-11-03 DIAGNOSIS — I10 Essential (primary) hypertension: Secondary | ICD-10-CM | POA: Diagnosis not present

## 2020-11-03 DIAGNOSIS — G311 Senile degeneration of brain, not elsewhere classified: Secondary | ICD-10-CM | POA: Diagnosis not present

## 2020-11-04 DIAGNOSIS — I10 Essential (primary) hypertension: Secondary | ICD-10-CM | POA: Diagnosis not present

## 2020-11-04 DIAGNOSIS — K219 Gastro-esophageal reflux disease without esophagitis: Secondary | ICD-10-CM | POA: Diagnosis not present

## 2020-11-04 DIAGNOSIS — I4891 Unspecified atrial fibrillation: Secondary | ICD-10-CM | POA: Diagnosis not present

## 2020-11-04 DIAGNOSIS — N401 Enlarged prostate with lower urinary tract symptoms: Secondary | ICD-10-CM | POA: Diagnosis not present

## 2020-11-04 DIAGNOSIS — N183 Chronic kidney disease, stage 3 unspecified: Secondary | ICD-10-CM | POA: Diagnosis not present

## 2020-11-04 DIAGNOSIS — G311 Senile degeneration of brain, not elsewhere classified: Secondary | ICD-10-CM | POA: Diagnosis not present

## 2020-11-05 DIAGNOSIS — I10 Essential (primary) hypertension: Secondary | ICD-10-CM | POA: Diagnosis not present

## 2020-11-05 DIAGNOSIS — N401 Enlarged prostate with lower urinary tract symptoms: Secondary | ICD-10-CM | POA: Diagnosis not present

## 2020-11-05 DIAGNOSIS — I4891 Unspecified atrial fibrillation: Secondary | ICD-10-CM | POA: Diagnosis not present

## 2020-11-05 DIAGNOSIS — N183 Chronic kidney disease, stage 3 unspecified: Secondary | ICD-10-CM | POA: Diagnosis not present

## 2020-11-05 DIAGNOSIS — G311 Senile degeneration of brain, not elsewhere classified: Secondary | ICD-10-CM | POA: Diagnosis not present

## 2020-11-05 DIAGNOSIS — K219 Gastro-esophageal reflux disease without esophagitis: Secondary | ICD-10-CM | POA: Diagnosis not present

## 2020-11-07 DIAGNOSIS — I4891 Unspecified atrial fibrillation: Secondary | ICD-10-CM | POA: Diagnosis not present

## 2020-11-07 DIAGNOSIS — N401 Enlarged prostate with lower urinary tract symptoms: Secondary | ICD-10-CM | POA: Diagnosis not present

## 2020-11-07 DIAGNOSIS — I10 Essential (primary) hypertension: Secondary | ICD-10-CM | POA: Diagnosis not present

## 2020-11-07 DIAGNOSIS — G311 Senile degeneration of brain, not elsewhere classified: Secondary | ICD-10-CM | POA: Diagnosis not present

## 2020-11-07 DIAGNOSIS — K219 Gastro-esophageal reflux disease without esophagitis: Secondary | ICD-10-CM | POA: Diagnosis not present

## 2020-11-07 DIAGNOSIS — N183 Chronic kidney disease, stage 3 unspecified: Secondary | ICD-10-CM | POA: Diagnosis not present

## 2020-11-10 DIAGNOSIS — N183 Chronic kidney disease, stage 3 unspecified: Secondary | ICD-10-CM | POA: Diagnosis not present

## 2020-11-10 DIAGNOSIS — G311 Senile degeneration of brain, not elsewhere classified: Secondary | ICD-10-CM | POA: Diagnosis not present

## 2020-11-10 DIAGNOSIS — I4891 Unspecified atrial fibrillation: Secondary | ICD-10-CM | POA: Diagnosis not present

## 2020-11-10 DIAGNOSIS — N401 Enlarged prostate with lower urinary tract symptoms: Secondary | ICD-10-CM | POA: Diagnosis not present

## 2020-11-10 DIAGNOSIS — K219 Gastro-esophageal reflux disease without esophagitis: Secondary | ICD-10-CM | POA: Diagnosis not present

## 2020-11-10 DIAGNOSIS — I10 Essential (primary) hypertension: Secondary | ICD-10-CM | POA: Diagnosis not present

## 2020-11-11 DIAGNOSIS — I4891 Unspecified atrial fibrillation: Secondary | ICD-10-CM | POA: Diagnosis not present

## 2020-11-11 DIAGNOSIS — I10 Essential (primary) hypertension: Secondary | ICD-10-CM | POA: Diagnosis not present

## 2020-11-11 DIAGNOSIS — N183 Chronic kidney disease, stage 3 unspecified: Secondary | ICD-10-CM | POA: Diagnosis not present

## 2020-11-11 DIAGNOSIS — G311 Senile degeneration of brain, not elsewhere classified: Secondary | ICD-10-CM | POA: Diagnosis not present

## 2020-11-11 DIAGNOSIS — K219 Gastro-esophageal reflux disease without esophagitis: Secondary | ICD-10-CM | POA: Diagnosis not present

## 2020-11-11 DIAGNOSIS — N401 Enlarged prostate with lower urinary tract symptoms: Secondary | ICD-10-CM | POA: Diagnosis not present

## 2020-11-12 DIAGNOSIS — N183 Chronic kidney disease, stage 3 unspecified: Secondary | ICD-10-CM | POA: Diagnosis not present

## 2020-11-12 DIAGNOSIS — K219 Gastro-esophageal reflux disease without esophagitis: Secondary | ICD-10-CM | POA: Diagnosis not present

## 2020-11-12 DIAGNOSIS — I10 Essential (primary) hypertension: Secondary | ICD-10-CM | POA: Diagnosis not present

## 2020-11-12 DIAGNOSIS — N401 Enlarged prostate with lower urinary tract symptoms: Secondary | ICD-10-CM | POA: Diagnosis not present

## 2020-11-12 DIAGNOSIS — G311 Senile degeneration of brain, not elsewhere classified: Secondary | ICD-10-CM | POA: Diagnosis not present

## 2020-11-12 DIAGNOSIS — I4891 Unspecified atrial fibrillation: Secondary | ICD-10-CM | POA: Diagnosis not present

## 2020-11-13 DIAGNOSIS — N183 Chronic kidney disease, stage 3 unspecified: Secondary | ICD-10-CM | POA: Diagnosis not present

## 2020-11-13 DIAGNOSIS — I4891 Unspecified atrial fibrillation: Secondary | ICD-10-CM | POA: Diagnosis not present

## 2020-11-13 DIAGNOSIS — E039 Hypothyroidism, unspecified: Secondary | ICD-10-CM | POA: Diagnosis not present

## 2020-11-13 DIAGNOSIS — R634 Abnormal weight loss: Secondary | ICD-10-CM | POA: Diagnosis not present

## 2020-11-13 DIAGNOSIS — I10 Essential (primary) hypertension: Secondary | ICD-10-CM | POA: Diagnosis not present

## 2020-11-13 DIAGNOSIS — G311 Senile degeneration of brain, not elsewhere classified: Secondary | ICD-10-CM | POA: Diagnosis not present

## 2020-11-13 DIAGNOSIS — R630 Anorexia: Secondary | ICD-10-CM | POA: Diagnosis not present

## 2020-11-13 DIAGNOSIS — E569 Vitamin deficiency, unspecified: Secondary | ICD-10-CM | POA: Diagnosis not present

## 2020-11-13 DIAGNOSIS — K219 Gastro-esophageal reflux disease without esophagitis: Secondary | ICD-10-CM | POA: Diagnosis not present

## 2020-11-13 DIAGNOSIS — R531 Weakness: Secondary | ICD-10-CM | POA: Diagnosis not present

## 2020-11-13 DIAGNOSIS — H3530 Unspecified macular degeneration: Secondary | ICD-10-CM | POA: Diagnosis not present

## 2020-11-13 DIAGNOSIS — N401 Enlarged prostate with lower urinary tract symptoms: Secondary | ICD-10-CM | POA: Diagnosis not present

## 2020-11-13 DIAGNOSIS — M1990 Unspecified osteoarthritis, unspecified site: Secondary | ICD-10-CM | POA: Diagnosis not present

## 2020-11-14 DIAGNOSIS — I4891 Unspecified atrial fibrillation: Secondary | ICD-10-CM | POA: Diagnosis not present

## 2020-11-14 DIAGNOSIS — K219 Gastro-esophageal reflux disease without esophagitis: Secondary | ICD-10-CM | POA: Diagnosis not present

## 2020-11-14 DIAGNOSIS — N401 Enlarged prostate with lower urinary tract symptoms: Secondary | ICD-10-CM | POA: Diagnosis not present

## 2020-11-14 DIAGNOSIS — N183 Chronic kidney disease, stage 3 unspecified: Secondary | ICD-10-CM | POA: Diagnosis not present

## 2020-11-14 DIAGNOSIS — I10 Essential (primary) hypertension: Secondary | ICD-10-CM | POA: Diagnosis not present

## 2020-11-14 DIAGNOSIS — G311 Senile degeneration of brain, not elsewhere classified: Secondary | ICD-10-CM | POA: Diagnosis not present

## 2020-11-17 DIAGNOSIS — I4891 Unspecified atrial fibrillation: Secondary | ICD-10-CM | POA: Diagnosis not present

## 2020-11-17 DIAGNOSIS — K219 Gastro-esophageal reflux disease without esophagitis: Secondary | ICD-10-CM | POA: Diagnosis not present

## 2020-11-17 DIAGNOSIS — N183 Chronic kidney disease, stage 3 unspecified: Secondary | ICD-10-CM | POA: Diagnosis not present

## 2020-11-17 DIAGNOSIS — G311 Senile degeneration of brain, not elsewhere classified: Secondary | ICD-10-CM | POA: Diagnosis not present

## 2020-11-17 DIAGNOSIS — N401 Enlarged prostate with lower urinary tract symptoms: Secondary | ICD-10-CM | POA: Diagnosis not present

## 2020-11-17 DIAGNOSIS — I10 Essential (primary) hypertension: Secondary | ICD-10-CM | POA: Diagnosis not present

## 2020-12-13 DEATH — deceased
